# Patient Record
Sex: Male | Born: 1935 | Race: White | Hispanic: No | Marital: Married | Smoking: Never smoker
Health system: Southern US, Community
[De-identification: ages and names within clinical notes are randomized; demographics above are authoritative.]

## PROBLEM LIST (undated history)

## (undated) DIAGNOSIS — I252 Old myocardial infarction: Secondary | ICD-10-CM

## (undated) DIAGNOSIS — E119 Type 2 diabetes mellitus without complications: Secondary | ICD-10-CM

## (undated) DIAGNOSIS — I4891 Unspecified atrial fibrillation: Secondary | ICD-10-CM

## (undated) DIAGNOSIS — M1990 Unspecified osteoarthritis, unspecified site: Secondary | ICD-10-CM

## (undated) DIAGNOSIS — E7800 Pure hypercholesterolemia, unspecified: Secondary | ICD-10-CM

## (undated) DIAGNOSIS — N189 Chronic kidney disease, unspecified: Secondary | ICD-10-CM

## (undated) DIAGNOSIS — K5792 Diverticulitis of intestine, part unspecified, without perforation or abscess without bleeding: Secondary | ICD-10-CM

## (undated) DIAGNOSIS — I209 Angina pectoris, unspecified: Secondary | ICD-10-CM

## (undated) DIAGNOSIS — I10 Essential (primary) hypertension: Secondary | ICD-10-CM

## (undated) DIAGNOSIS — C801 Malignant (primary) neoplasm, unspecified: Secondary | ICD-10-CM

## (undated) DIAGNOSIS — I739 Peripheral vascular disease, unspecified: Secondary | ICD-10-CM

## (undated) DIAGNOSIS — I5030 Unspecified diastolic (congestive) heart failure: Secondary | ICD-10-CM

## (undated) DIAGNOSIS — I2109 ST elevation (STEMI) myocardial infarction involving other coronary artery of anterior wall: Principal | ICD-10-CM

## (undated) DIAGNOSIS — I2510 Atherosclerotic heart disease of native coronary artery without angina pectoris: Secondary | ICD-10-CM

## (undated) DIAGNOSIS — K219 Gastro-esophageal reflux disease without esophagitis: Secondary | ICD-10-CM

## (undated) HISTORY — PX: KNEE SURGERY: SHX244

## (undated) HISTORY — PX: EYE SURGERY: SHX253

## (undated) HISTORY — PX: CHOLECYSTECTOMY: SHX55

## (undated) HISTORY — PX: CORONARY ANGIOPLASTY: SHX604

---

## 2006-03-09 ENCOUNTER — Emergency Department (HOSPITAL_COMMUNITY): Admission: EM | Admit: 2006-03-09 | Discharge: 2006-03-09 | Payer: Self-pay | Admitting: Emergency Medicine

## 2006-03-11 ENCOUNTER — Emergency Department (HOSPITAL_COMMUNITY): Admission: EM | Admit: 2006-03-11 | Discharge: 2006-03-11 | Payer: Self-pay | Admitting: Emergency Medicine

## 2013-05-12 ENCOUNTER — Ambulatory Visit (HOSPITAL_COMMUNITY): Admit: 2013-05-12 | Payer: Self-pay | Admitting: Interventional Cardiology

## 2013-05-12 ENCOUNTER — Inpatient Hospital Stay (HOSPITAL_COMMUNITY)
Admission: EM | Admit: 2013-05-12 | Discharge: 2013-05-13 | DRG: 247 | Disposition: A | Discharge status: Home / Self Care | Payer: Medicare (Managed Care) | Attending: Interventional Cardiology | Admitting: Interventional Cardiology

## 2013-05-12 ENCOUNTER — Encounter (HOSPITAL_COMMUNITY): Payer: Self-pay

## 2013-05-12 ENCOUNTER — Encounter (HOSPITAL_COMMUNITY)
Admission: EM | Disposition: A | Discharge status: Home / Self Care | Payer: PRIVATE HEALTH INSURANCE | Source: Home / Self Care | Attending: Interventional Cardiology

## 2013-05-12 DIAGNOSIS — Z79899 Other long term (current) drug therapy: Secondary | ICD-10-CM

## 2013-05-12 DIAGNOSIS — I4891 Unspecified atrial fibrillation: Secondary | ICD-10-CM | POA: Diagnosis present

## 2013-05-12 DIAGNOSIS — Z7982 Long term (current) use of aspirin: Secondary | ICD-10-CM

## 2013-05-12 DIAGNOSIS — I10 Essential (primary) hypertension: Secondary | ICD-10-CM | POA: Diagnosis present

## 2013-05-12 DIAGNOSIS — I213 ST elevation (STEMI) myocardial infarction of unspecified site: Secondary | ICD-10-CM

## 2013-05-12 DIAGNOSIS — I4510 Unspecified right bundle-branch block: Secondary | ICD-10-CM | POA: Diagnosis present

## 2013-05-12 DIAGNOSIS — I2109 ST elevation (STEMI) myocardial infarction involving other coronary artery of anterior wall: Principal | ICD-10-CM | POA: Diagnosis present

## 2013-05-12 DIAGNOSIS — E7800 Pure hypercholesterolemia, unspecified: Secondary | ICD-10-CM | POA: Diagnosis present

## 2013-05-12 DIAGNOSIS — K5730 Diverticulosis of large intestine without perforation or abscess without bleeding: Secondary | ICD-10-CM | POA: Diagnosis present

## 2013-05-12 DIAGNOSIS — E119 Type 2 diabetes mellitus without complications: Secondary | ICD-10-CM | POA: Diagnosis present

## 2013-05-12 DIAGNOSIS — I2510 Atherosclerotic heart disease of native coronary artery without angina pectoris: Secondary | ICD-10-CM | POA: Diagnosis present

## 2013-05-12 HISTORY — DX: Gastro-esophageal reflux disease without esophagitis: K21.9

## 2013-05-12 HISTORY — DX: Chronic kidney disease, unspecified: N18.9

## 2013-05-12 HISTORY — DX: ST elevation (STEMI) myocardial infarction involving other coronary artery of anterior wall: I21.09

## 2013-05-12 HISTORY — DX: Atherosclerotic heart disease of native coronary artery without angina pectoris: I25.10

## 2013-05-12 HISTORY — DX: Essential (primary) hypertension: I10

## 2013-05-12 HISTORY — DX: Diverticulitis of intestine, part unspecified, without perforation or abscess without bleeding: K57.92

## 2013-05-12 HISTORY — DX: Malignant (primary) neoplasm, unspecified: C80.1

## 2013-05-12 HISTORY — PX: LEFT HEART CATH: SHX5478

## 2013-05-12 HISTORY — DX: Unspecified atrial fibrillation: I48.91

## 2013-05-12 HISTORY — DX: Peripheral vascular disease, unspecified: I73.9

## 2013-05-12 HISTORY — DX: Unspecified osteoarthritis, unspecified site: M19.90

## 2013-05-12 HISTORY — DX: Angina pectoris, unspecified: I20.9

## 2013-05-12 HISTORY — DX: Type 2 diabetes mellitus without complications: E11.9

## 2013-05-12 HISTORY — DX: Pure hypercholesterolemia, unspecified: E78.00

## 2013-05-12 LAB — COMPREHENSIVE METABOLIC PANEL
ALT: 12 U/L (ref 0–53)
AST: 22 U/L (ref 0–37)
Albumin: 3.9 g/dL (ref 3.5–5.2)
BUN: 26 mg/dL — ABNORMAL HIGH (ref 6–23)
CO2: 23 mEq/L (ref 19–32)
Calcium: 9.9 mg/dL (ref 8.4–10.5)
Creatinine, Ser: 1.23 mg/dL (ref 0.50–1.35)
Glucose, Bld: 188 mg/dL — ABNORMAL HIGH (ref 70–99)
Potassium: 4.4 mEq/L (ref 3.5–5.1)
Total Bilirubin: 0.6 mg/dL (ref 0.3–1.2)
Total Protein: 7.6 g/dL (ref 6.0–8.3)

## 2013-05-12 LAB — CBC
HCT: 41.6 % (ref 39.0–52.0)
Hemoglobin: 13.8 g/dL (ref 13.0–17.0)
MCV: 95.4 fL (ref 78.0–100.0)
RBC: 4.36 MIL/uL (ref 4.22–5.81)

## 2013-05-12 LAB — APTT: aPTT: 35 seconds (ref 24–37)

## 2013-05-12 LAB — HEMOGLOBIN A1C
Hgb A1c MFr Bld: 7.2 % — ABNORMAL HIGH (ref <5.7)
Mean Plasma Glucose: 160 mg/dL — ABNORMAL HIGH (ref <117)

## 2013-05-12 LAB — POCT I-STAT CREATININE: Creatinine, Ser: 1.5 mg/dL — ABNORMAL HIGH (ref 0.50–1.35)

## 2013-05-12 LAB — MRSA PCR SCREENING: MRSA by PCR: NEGATIVE

## 2013-05-12 LAB — POCT I-STAT TROPONIN I: Troponin i, poc: 0 ng/mL (ref 0.00–0.08)

## 2013-05-12 LAB — TROPONIN I
Troponin I: <0.3 ng/mL (ref <0.30)
Troponin I: <0.3 ng/mL (ref <0.30)

## 2013-05-12 LAB — CK TOTAL AND CKMB (NOT AT ARMC)
CK, MB: 2 ng/mL (ref 0.3–4.0)
Total CK: 101 U/L (ref 7–232)

## 2013-05-12 SURGERY — LEFT HEART CATH
Anesthesia: LOCAL

## 2013-05-12 MED ORDER — FAMOTIDINE 20 MG PO TABS
20.0000 mg | ORAL_TABLET | Freq: Every day | ORAL | Status: DC
  Administered 2013-05-12 – 2013-05-13 (×2): 20 mg via ORAL
  Filled 2013-05-12 (×2): qty 1

## 2013-05-12 MED ORDER — SODIUM CHLORIDE 0.9 % IV SOLN
1.7500 mg/kg/h | INTRAVENOUS | Status: AC
  Administered 2013-05-12: 1.75 mg/kg/h via INTRAVENOUS
  Filled 2013-05-12: qty 250

## 2013-05-12 MED ORDER — ACETAMINOPHEN 325 MG PO TABS: 650.0000 mg | ORAL_TABLET | ORAL | Status: DC | PRN

## 2013-05-12 MED ORDER — DIPHENHYDRAMINE HCL 25 MG PO CAPS: 25.0000 mg | ORAL_CAPSULE | Freq: Every evening | ORAL | Status: DC | PRN

## 2013-05-12 MED ORDER — MORPHINE SULFATE 2 MG/ML IJ SOLN
INTRAMUSCULAR | Status: AC
  Filled 2013-05-12: qty 1

## 2013-05-12 MED ORDER — NITROGLYCERIN 0.4 MG SL SUBL
0.4000 mg | SUBLINGUAL_TABLET | SUBLINGUAL | Status: DC | PRN
  Administered 2013-05-12 (×2): 0.4 mg via SUBLINGUAL
  Filled 2013-05-12: qty 25

## 2013-05-12 MED ORDER — ACETAMINOPHEN 500 MG PO TABS: 500.0000 mg | ORAL_TABLET | Freq: Every evening | ORAL | Status: DC | PRN

## 2013-05-12 MED ORDER — HEPARIN (PORCINE) IN NACL 100-0.45 UNIT/ML-% IJ SOLN
INTRAMUSCULAR | Status: AC
  Filled 2013-05-12: qty 250

## 2013-05-12 MED ORDER — INSULIN ASPART 100 UNIT/ML ~~LOC~~ SOLN
0.0000 [IU] | Freq: Three times a day (TID) | SUBCUTANEOUS | Status: DC
  Administered 2013-05-12: 2 [IU] via SUBCUTANEOUS
  Administered 2013-05-12: 3 [IU] via SUBCUTANEOUS
  Administered 2013-05-13: 2 [IU] via SUBCUTANEOUS
  Administered 2013-05-13: 3 [IU] via SUBCUTANEOUS

## 2013-05-12 MED ORDER — ZOLPIDEM TARTRATE 5 MG PO TABS: 5.0000 mg | ORAL_TABLET | Freq: Every evening | ORAL | Status: DC | PRN

## 2013-05-12 MED ORDER — SIMVASTATIN 40 MG PO TABS
40.0000 mg | ORAL_TABLET | Freq: Every day | ORAL | Status: DC
  Administered 2013-05-12: 40 mg via ORAL
  Filled 2013-05-12 (×2): qty 1

## 2013-05-12 MED ORDER — MORPHINE SULFATE 2 MG/ML IJ SOLN
2.0000 mg | Freq: Once | INTRAMUSCULAR | Status: AC
  Administered 2013-05-12: 2 mg via INTRAVENOUS

## 2013-05-12 MED ORDER — HEPARIN SODIUM (PORCINE) 5000 UNIT/ML IJ SOLN
4000.0000 [IU] | INTRAMUSCULAR | Status: AC
Start: 2013-05-12 — End: 2013-05-12
  Administered 2013-05-12: 4000 [IU] via INTRAVENOUS
  Filled 2013-05-12: qty 1

## 2013-05-12 MED ORDER — ASPIRIN 81 MG PO CHEW
81.0000 mg | CHEWABLE_TABLET | Freq: Every day | ORAL | Status: DC
  Administered 2013-05-13: 81 mg via ORAL
  Filled 2013-05-12: qty 1

## 2013-05-12 MED ORDER — SODIUM CHLORIDE 0.9 % IV SOLN
1.0000 mL/kg/h | INTRAVENOUS | Status: AC
  Administered 2013-05-12: 1 mL/kg/h via INTRAVENOUS

## 2013-05-12 MED ORDER — GI COCKTAIL ~~LOC~~: 30.0000 mL | Freq: Once | ORAL | Status: DC

## 2013-05-12 MED ORDER — NITROGLYCERIN 0.4 MG SL SUBL
SUBLINGUAL_TABLET | SUBLINGUAL | Status: AC
  Administered 2013-05-12: 0.4 mg via SUBLINGUAL
  Filled 2013-05-12: qty 25

## 2013-05-12 MED ORDER — ALUM & MAG HYDROXIDE-SIMETH 200-200-20 MG/5ML PO SUSP
30.0000 mL | ORAL | Status: DC | PRN
  Administered 2013-05-12: 30 mL via ORAL
  Filled 2013-05-12: qty 30

## 2013-05-12 MED ORDER — DIPHENHYDRAMINE-APAP (SLEEP) 25-500 MG PO TABS: 1.0000 | ORAL_TABLET | Freq: Every evening | ORAL | Status: DC | PRN

## 2013-05-12 MED ORDER — SODIUM CHLORIDE 0.9 % IV SOLN
INTRAVENOUS | Status: DC
  Administered 2013-05-12: 1000 mL via INTRAVENOUS

## 2013-05-12 MED ORDER — METOPROLOL TARTRATE 25 MG PO TABS
25.0000 mg | ORAL_TABLET | Freq: Two times a day (BID) | ORAL | Status: DC
  Administered 2013-05-12 – 2013-05-13 (×3): 25 mg via ORAL
  Filled 2013-05-12 (×4): qty 1

## 2013-05-12 MED ORDER — ONDANSETRON HCL 4 MG/2ML IJ SOLN: 4.0000 mg | Freq: Four times a day (QID) | INTRAMUSCULAR | Status: DC | PRN

## 2013-05-12 MED ORDER — ASPIRIN 81 MG PO CHEW: 81.0000 mg | CHEWABLE_TABLET | Freq: Every day | ORAL | Status: DC

## 2013-05-12 MED ORDER — HEPARIN (PORCINE) IN NACL 100-0.45 UNIT/ML-% IJ SOLN
1000.0000 [IU]/h | Freq: Once | INTRAMUSCULAR | Status: AC
  Administered 2013-05-12: 1000 [IU]/h via INTRAVENOUS

## 2013-05-12 MED ORDER — WHITE PETROLATUM GEL
Status: AC
  Administered 2013-05-12: 0.2
  Filled 2013-05-12: qty 5

## 2013-05-12 MED ORDER — ATORVASTATIN CALCIUM 40 MG PO TABS
40.0000 mg | ORAL_TABLET | Freq: Every day | ORAL | Status: DC
  Filled 2013-05-12: qty 1

## 2013-05-12 MED ORDER — NIFEDIPINE ER 60 MG PO TB24
60.0000 mg | ORAL_TABLET | Freq: Every day | ORAL | Status: DC
  Administered 2013-05-12 – 2013-05-13 (×2): 60 mg via ORAL
  Filled 2013-05-12 (×2): qty 1

## 2013-05-12 MED ORDER — TICAGRELOR 90 MG PO TABS
90.0000 mg | ORAL_TABLET | Freq: Two times a day (BID) | ORAL | Status: DC
  Administered 2013-05-12: 90 mg via ORAL
  Filled 2013-05-12 (×4): qty 1

## 2013-05-12 MED ORDER — ASPIRIN 81 MG PO CHEW
324.0000 mg | CHEWABLE_TABLET | Freq: Once | ORAL | Status: AC
  Administered 2013-05-12: 81 mg via ORAL
  Filled 2013-05-12: qty 1

## 2013-05-12 NOTE — CV Procedure (Addendum)
PROCEDURE:  Left heart catheterization with selective coronary angiography.  PCI LAD.  INDICATIONS:  Non-STEMI  The risks, benefits, and details of the procedure were explained to the patient.  The patient verbalized understanding and wanted to proceed.  Informed written consent was obtained.  PROCEDURE TECHNIQUE:  After Xylocaine anesthesia a 10F slender sheath was placed in the right radial artery with a single anterior needle wall stick.   Left coronary angiography was done using a Judkins L 3.5 guide catheter.  Right coronary angiography was done using a Judkins R4 guide catheter.  Left ventriculography was done using a pigtail catheter.    CONTRAST:  Total of 115 cc.  COMPLICATIONS:  None.    HEMODYNAMICS:  Aortic pressure was 99/64; LV pressure was 106/18; LVEDP 24.  There was no gradient between the left ventricle and aorta.    ANGIOGRAPHIC DATA:   The left main coronary artery is widely patent.  The left anterior descending artery is mildly diseased in the proximal vessel.  After the first septal perforator, there is moderate diffuse disease, up to 50%, which is more significant at the origin of the first diagonal.  The first diagonal has a proximal 80% stenosis.  This vessel is medium size.  Further along in the mid LAD, there is a diffuse stenosis with a focal 95% lesion.  The left circumflex artery is a large dominant vessel.  There is a small ramus vessel which is patent.  There is a large first obtuse marginal vessel which is widely patent.  The second obtuse marginal is medium-sized and widely patent.  The left PDA is widely patent as well.  The right coronary artery is a small, nondominant vessel.  LEFT VENTRICULOGRAM:  Left ventricular angiogram was not done.  LVEDP was 24 mmHg.  PCI NARRATIVE: A CLS 3.0 guiding catheters used to engage the left main.  Angiomax was used for anticoagulation.  An ACT was used to check that the Angiomax is therapeutic.  A pro-water wire was  placed across the stenosis in the LAD.  A 2.5 x 12 balloon was used to predilate the area of disease in the mid LAD.  A 2.5 x 24 Promus drug-eluting stent was then deployed.  A 2.5 noncompliant balloon was used to post dilate the stent to 2.6 mm in diameter.  There is an excellent in traffic result.  Several doses of intracoronary nitroglycerin were administered.  There is no residual stenosis in the stented segment.  IMPRESSIONS:  1. Normal left main coronary artery. 2. 95% lesion in the mid left anterior descending artery, successfully treated with a 2.5 x 24 Promus DES, postdilated to 2.6 mm.  Moderate disease more proximal to the severe stenosis.  Medium sized diagonal vessel with a 90% stenosis. 3. Widely patent left circumflex artery and its branches. 4. Patent, nondominant right coronary artery. 5. LVEDP 24 mmHg.  Ejection fraction not assessed.   RECOMMENDATION:  He'll need dual antiplatelet therapy for at least a year.  At the end of the procedure, he ended up in atrial fibrillation.  We'll have to assess if he continues to stay in atrial fibrillation or if he converts back to normal sinus rhythm.  He has not been on anticoagulation in the past.  We may need to reconsider this.  We'll hold metformin at this time.  We'll use sliding scale insulins.  He was still having some mild chest discomfort at the end of the procedure.  It's not altogether clear to me whether all  of his discomfort is coming from a cardiac source.

## 2013-05-12 NOTE — ED Provider Notes (Signed)
CSN: [id number]     Arrival date & time 05/12/13  0600 History   First MD Initiated Contact with Patient 05/12/13 [phone]     Chief Complaint  Patient presents with  . Chest Pain    Patient is a 79 y.o. male presenting with chest pain. The history is provided by the patient. The history is limited by the condition of the patient.  Chest Pain Pain location:  Substernal area Pain quality: pressure   Pain radiates to:  Does not radiate Pain severity:  Severe Onset quality:  Gradual Duration:  7 hours Timing:  Intermittent Progression:  Worsening Chronicity:  New Relieved by:  Nothing Worsened by:  Nothing tried Ineffective treatments:  Aspirin Associated symptoms: shortness of breath and weakness     Past Medical History  Diagnosis Date  . Atrial fibrillation   . Diverticulitis   . High cholesterol   . Hypertension   . Diabetes mellitus without complication    Past Surgical History  Procedure Laterality Date  . Cholecystectomy    . Knee surgery     No family history on file. History  Substance Use Topics  . Smoking status: Never Smoker   . Smokeless tobacco: Never Used  . Alcohol Use: No    Review of Systems  Unable to perform ROS: Acuity of condition  Respiratory: Positive for shortness of breath.   Cardiovascular: Positive for chest pain.  Gastrointestinal: Negative for blood in stool.  Neurological: Positive for weakness.    Allergies  Review of patient's allergies indicates no known allergies.  Home Medications   Current Outpatient Rx  Name  Route  Sig  Dispense  Refill  . aspirin 81 MG chewable tablet   Oral   Chew 81 mg by mouth daily.         . diphenhydramine-acetaminophen (TYLENOL PM) 25-500 MG TABS   Oral   Take 1 tablet by mouth at bedtime as needed.         Marland Kitchen lisinopril (PRINIVIL,ZESTRIL) 20 MG tablet   Oral   Take 20 mg by mouth daily.         Marland Kitchen NIFEdipine (PROCARDIA XL/ADALAT-CC) 60 MG 24 hr tablet   Oral   Take 60 mg by mouth  daily.         . ranitidine (ZANTAC) 300 MG capsule   Oral   Take 300 mg by mouth every evening.         . simvastatin (ZOCOR) 40 MG tablet   Oral   Take 40 mg by mouth every evening.         . triamterene-hydrochlorothiazide (DYAZIDE) 37.5-25 MG per capsule   Oral   Take 1 capsule by mouth every morning.          BP 141/60  Pulse 117  Temp(Src) 98.7 F (37.1 C) (Oral)  Resp 24  Ht 5\' 7"  (1.702 m)  Wt 272 lb (123.378 kg)  BMI 42.59 kg/m2  SpO2 97% Physical Exam CONSTITUTIONAL: Well developed/well nourished, uncomfortable appearing HEAD: Normocephalic/atraumatic EYES: EOMI/PERRL ENMT: Mucous membranes moist NECK: supple no meningeal signs SPINE:entire spine nontender CV: S1/S2 noted, no murmurs/rubs/gallops noted LUNGS: Lungs are clear to auscultation bilaterally, no apparent distress ABDOMEN: soft, obese nontender, no rebound or guarding GU:no cva tenderness NEURO: Pt is awake/alert, moves all extremitiesx4 EXTREMITIES: pulses normal, full ROM SKIN: warm, color normal PSYCH: no abnormalities of mood noted  ED Course  Procedures CRITICAL CARE Performed by: Sharyon Cable Total critical care time: 31 Critical care time  was exclusive of separately billable procedures and treating other patients. Critical care was necessary to treat or prevent imminent or life-threatening deterioration. Critical care was time spent personally by me on the following activities: development of treatment plan with patient and/or surrogate as well as nursing, discussions with consultants, evaluation of patient's response to treatment, examination of patient, obtaining history from patient or surrogate, ordering and performing treatments and interventions, ordering and review of laboratory studies,  and re-evaluation of patient's condition.  Labs Review Labs Reviewed  POCT I-STAT CREATININE - Abnormal; Notable for the following:    Creatinine, Ser 1.50 (*)    All other  components within normal limits  APTT  CBC  COMPREHENSIVE METABOLIC PANEL  PROTIME-INR  POCT I-STAT TROPONIN I   EKG reviewed and I called code STEMI for concerning EKG findings in inferior leads without any previous EKG to compare I spoke to dr Irish Lack and we discussed patient care. He requested I send text photo of EKG if possible so he could review the EKG  Patient was given ASA (pt had already taken 3 ASA at home), NTG and heparin He had minimal change in his pain with NTG Patient accepted to [facility] and will proceed to cardiac cath lab    MDM   1. STEMI (ST elevation myocardial infarction)    Nursing notes including past medical history and social history reviewed and considered in documentation Labs/vital reviewed and considered    Date: 05/12/2013 0603am  Rate: 99  Rhythm: normal sinus rhythm  QRS Axis: normal  Intervals: normal  ST/T Wave abnormalities: ST elevations inferiorly  Conduction Disutrbances:none  Narrative Interpretation:   Old EKG Reviewed: none available    Sharyon Cable, MD 05/12/13 [phone]

## 2013-05-12 NOTE — Progress Notes (Signed)
RN notified by pharmacist that GI cocktail incompatable with [facility]. Discontinued order and placed an order for Maalox per Cardiology standing orders

## 2013-05-12 NOTE — Progress Notes (Addendum)
Patient continues to have mild substernal chest pain that is made worse when patients lower sternum is palpated. Cardiac enzymes negative. Dr. Marlou Porch notified. New order received for GI coctail and if pain not relieved, RN instructed to give Nitro SL and place 1 inch of paste q 6 hours. Patient request to get out of bed. Transferred to chair without difficulty and denied CP worsening or new SOB. Will continue to monitor patient closely.

## 2013-05-12 NOTE — ED Notes (Signed)
Pt continues to rate pain 9/10 after first nitro. BP 125/52. MD made aware. Morphine 2mg  IVP stat ordered

## 2013-05-12 NOTE — Progress Notes (Signed)
Portia, RN, called: Still having some mild CP.  Pepcid seems to be helping. Will try GI coctail.  Next will try SL NTG if needed. If this helps then for overnight, NTG paste.

## 2013-05-12 NOTE — Progress Notes (Addendum)
Nitro .4mg  given SL while RN waits for Maalox. CP 1 prior to administration of nitro, and CP remained a 1 18min post nitro administration. CP remains reproducible with lower sternal palpation. Patient has displayed no other symptoms of pain throughout day other than verbalization. Patient continues to converse with nurses and family. Watching TV. Will continue to monitor patient and notify MD as needed.

## 2013-05-12 NOTE — ED Notes (Signed)
Pt rates pain 9/10 prior to first nitro administration. BP 141/60, pulse 102

## 2013-05-12 NOTE — H&P (Signed)
Admit date: 05/12/2013 Referring Physician [facility] emergency department Primary Cardiologist Irish Lack- new Chief complaint/reason for admission: Chest discomfort  HPI: 79 year old man with diabetes and a history of atrial fibrillation.  He has not been on anticoagulation in the past.  He reported chest discomfort starting at about 11 PM.  Prior to this, he had been feeling well.  He had never had this type of chest discomfort before.  He lives most of the year in [place] but owns some land in [place] and spent some time here is well.  He was visiting.  He had eaten dinner with his son and felt fine.  His discomfort felt like an indigestion.  After a few hours, he took some aspirin with no relief.  At about 4 in the morning, he woke up his son and told him to take him to the emergency department.  He was seen at [facility] and there were no old ECGs for comparison.  He had a right bundle branch block and there was question of ST elevation in lead 3.  There is not much in the way of significant reciprocal ST depression.  However, given his persistent discomfort, he was transferred to [facility] for emergent cardiac catheterization.    PMH:    Past Medical History  Diagnosis Date  . Atrial fibrillation   . Diverticulitis   . High cholesterol   . Hypertension   . Diabetes mellitus without complication     PSH:    Past Surgical History  Procedure Laterality Date  . Cholecystectomy    . Knee surgery      ALLERGIES:   Review of patient's allergies indicates no known allergies.  Prior to Admit Meds:   Prescriptions prior to admission  Medication Sig Dispense Refill  . aspirin 81 MG chewable tablet Chew 81 mg by mouth daily.      . diphenhydramine-acetaminophen (TYLENOL PM) 25-500 MG TABS Take 1 tablet by mouth at bedtime as needed.      Marland Kitchen lisinopril (PRINIVIL,ZESTRIL) 20 MG tablet Take 20 mg by mouth daily.      Marland Kitchen NIFEdipine (PROCARDIA XL/ADALAT-CC) 60 MG 24 hr tablet Take 60 mg by  mouth daily.      . ranitidine (ZANTAC) 300 MG capsule Take 300 mg by mouth every evening.      . simvastatin (ZOCOR) 40 MG tablet Take 40 mg by mouth every evening.      . triamterene-hydrochlorothiazide (DYAZIDE) 37.5-25 MG per capsule Take 1 capsule by mouth every morning.       Family HX:   No family history on file. Social HX:    History   Social History  . Marital Status: Married    Spouse Name: N/A    Number of Children: N/A  . Years of Education: N/A   Occupational History  . Not on file.   Social History Main Topics  . Smoking status: Never Smoker   . Smokeless tobacco: Never Used  . Alcohol Use: No  . Drug Use: No  . Sexual Activity: Not on file   Other Topics Concern  . Not on file   Social History Narrative  . No narrative on file     ROS:  All 11 ROS were addressed and are negative except what is stated in the HPI  PHYSICAL EXAM Filed Vitals:   05/12/13 0636  BP: 122/68  Pulse: 103  Temp:   Resp: 16   General: Well developed, well nourished, in no acute distress  Head: Eyes PERRLA, No xanthomas.   Normal cephalic and atramatic  Lungs:   Clear bilaterally to auscultation and percussion. Heart:  Irregularly irregular, No JVD.  Abdomen: abdomen soft and non-tender  Msk:  Back normal,  Normal strength and tone for age. Extremities:  Trace edema.  Right Radial 2+ Neuro: Alert and oriented X 3. Psych:  Normal affect, responds appropriately   Labs:   Lab Results  Component Value Date   WBC 15.0* 05/12/2013   HGB 13.8 05/12/2013   HCT 41.6 05/12/2013   MCV 95.4 05/12/2013   PLT 267 05/12/2013    Recent Labs Lab 05/12/13 0611 05/12/13 0617  NA 134*  --   K 4.4  --   CL 99  --   CO2 23  --   BUN 26*  --   CREATININE 1.23 1.50*  CALCIUM 9.9  --   PROT 7.6  --   BILITOT 0.6  --   ALKPHOS 64  --   ALT 12  --   AST 22  --   GLUCOSE 188*  --    No results found for this basename: CKTOTAL, CKMB, CKMBINDEX, TROPONINI   No results found for this  basename: PTT   Lab Results  Component Value Date   INR 1.01 05/12/2013     No results found for this basename: CHOL   No results found for this basename: HDL   No results found for this basename: LDLCALC   No results found for this basename: TRIG   No results found for this basename: CHOLHDL   No results found for this basename: LDLDIRECT      Radiology:  @RISRSLT24 @  EKG:  Sinus rhythm with PACs, right bundle branch block  ASSESSMENT: Chest discomfort history concerning for ischemic symptoms with questionable ECG changes and no old ECG for comparison.  Multiple risk factors for CAD.  PLAN:  Plan for cardiac cath to evaluate coronary anatomy.  Further plans will be based on the results of the cath.  He does have some mild renal insufficiency.  Will likely hold ACE inhibitor around the time of cath and try to minimize contrast.  He is diabetic and he has not been taking anticoagulation for his atrial fibrillation.  He has no bleeding problems.  We'll plan on drug-eluting stent in-stent is required.  Jettie Booze., MD  05/12/2013  8:17 AM

## 2013-05-12 NOTE — Progress Notes (Signed)
Chaplain responded to page from ED. No family members in route per nursing. Patient taken directly to Cath Lab.   05/12/13 [id number]  Clinical Encounter Type  Visited With Patient not available;Health care provider  Visit Type Initial;Code;ED  Referral From Nurse

## 2013-05-12 NOTE — ED Notes (Signed)
Last night around 2300 I had a pain in the center of my chest and thought it was indigestion, I went back to sleep and woke up around 0200 and took 3 baby aspirin and I went back to bed after that and I tried to go to sleep but it would not happen. At 0400 in was not getting any better per pt.

## 2013-05-13 LAB — BASIC METABOLIC PANEL
BUN: 21 mg/dL (ref 6–23)
CO2: 21 mEq/L (ref 19–32)
Calcium: 9.5 mg/dL (ref 8.4–10.5)
Chloride: 99 mEq/L (ref 96–112)
Creatinine, Ser: 1.1 mg/dL (ref 0.50–1.35)
GFR calc Af Amer: 72 mL/min — ABNORMAL LOW (ref >90)
GFR calc non Af Amer: 62 mL/min — ABNORMAL LOW (ref >90)
Glucose, Bld: 143 mg/dL — ABNORMAL HIGH (ref 70–99)
Sodium: 132 mEq/L — ABNORMAL LOW (ref 135–145)

## 2013-05-13 LAB — LIPID PANEL
Cholesterol: 115 mg/dL (ref 0–200)
LDL Cholesterol: 48 mg/dL (ref 0–99)
Triglycerides: 124 mg/dL (ref <150)

## 2013-05-13 LAB — CBC
Hemoglobin: 13.1 g/dL (ref 13.0–17.0)
MCH: 32.3 pg (ref 26.0–34.0)
Platelets: 254 10*3/uL (ref 150–400)
RBC: 4.06 MIL/uL — ABNORMAL LOW (ref 4.22–5.81)
RDW: 15.2 % (ref 11.5–15.5)
WBC: 12.7 10*3/uL — ABNORMAL HIGH (ref 4.0–10.5)

## 2013-05-13 LAB — GLUCOSE, CAPILLARY: Glucose-Capillary: 129 mg/dL — ABNORMAL HIGH (ref 70–99)

## 2013-05-13 MED ORDER — WARFARIN - PHYSICIAN DOSING INPATIENT: 5.0000 | Freq: Every day | Status: DC

## 2013-05-13 MED ORDER — COUMADIN BOOK
Freq: Once | Status: AC
  Administered 2013-05-13: 1
  Filled 2013-05-13: qty 1

## 2013-05-13 MED ORDER — CLOPIDOGREL BISULFATE 75 MG PO TABS: 75.0000 mg | ORAL_TABLET | Freq: Every day | ORAL | Status: DC

## 2013-05-13 MED ORDER — NITROGLYCERIN 0.4 MG SL SUBL: 0.4000 mg | SUBLINGUAL_TABLET | SUBLINGUAL | Status: AC | PRN

## 2013-05-13 MED ORDER — WARFARIN VIDEO
Freq: Once | Status: AC
  Administered 2013-05-13: 12:00:00

## 2013-05-13 MED ORDER — WARFARIN SODIUM 5 MG PO TABS: 5.0000 mg | ORAL_TABLET | Freq: Every day | ORAL | Status: DC

## 2013-05-13 MED ORDER — WARFARIN SODIUM 5 MG PO TABS
5.0000 mg | ORAL_TABLET | Freq: Every day | ORAL | Status: DC
  Filled 2013-05-13: qty 1

## 2013-05-13 MED ORDER — METOPROLOL TARTRATE 25 MG PO TABS: 25.0000 mg | ORAL_TABLET | Freq: Two times a day (BID) | ORAL | Status: DC

## 2013-05-13 MED ORDER — ASPIRIN 81 MG PO CHEW: 81.0000 mg | CHEWABLE_TABLET | Freq: Every day | ORAL | Status: DC

## 2013-05-13 MED ORDER — WARFARIN - PHYSICIAN DOSING INPATIENT: Freq: Every day | Status: DC

## 2013-05-13 MED ORDER — CLOPIDOGREL BISULFATE 75 MG PO TABS
75.0000 mg | ORAL_TABLET | Freq: Every day | ORAL | Status: DC
  Administered 2013-05-13: 75 mg via ORAL
  Filled 2013-05-13: qty 1

## 2013-05-13 MED ORDER — NITROGLYCERIN 0.4 MG SL SUBL: 0.4000 mg | SUBLINGUAL_TABLET | SUBLINGUAL | Status: DC | PRN

## 2013-05-13 NOTE — Discharge Summary (Signed)
Physician Discharge Summary  Patient ID: Lawrence Harper MRN: [id number] DOB/AGE: July 24, 1932 79 y.o.  Admit date: 05/12/2013 Discharge date: 05/13/2013  Admission Diagnoses: STEMI - LAD DES  Discharge Diagnoses:  Active Problems:   Acute myocardial infarction of other anterior wall, initial episode of care   Diabetes mellitus without complication   Hypertension   Atrial fibrillation   Discharged Condition: Good  Hospital Course: HPI: 79 year old man with diabetes and a history of atrial fibrillation. He has not been on anticoagulation in the past. He reported chest discomfort starting at about 11 PM. Prior to this, he had been feeling well. He had never had this type of chest discomfort before. He lives most of the year in [place] but owns some land in [place] and spent some time here is well. He was visiting. He had eaten dinner with his son and felt fine. His discomfort felt like an indigestion. After a few hours, he took some aspirin with no relief. At about 4 in the morning, he woke up his son and told him to take him to the emergency department. He was seen at [facility] and there were no old ECGs for comparison. He had a right bundle branch block and there was question of ST elevation in lead 3. There is not much in the way of significant reciprocal ST depression. However, given his persistent discomfort, he was transferred to [facility] for emergent cardiac catheterization.   Cath 05/12/13: IMPRESSIONS:  1. Normal left main coronary artery. 2. 95% lesion in the mid left anterior descending artery, successfully treated with a 2.5 x 24 Promus DES, postdilated to 2.6 mm. Moderate disease more proximal to the severe stenosis. Medium sized diagonal vessel with a 90% stenosis. 3. Widely patent left circumflex artery and its branches. 4. Patent, nondominant right coronary artery. 5. LVEDP 24 mmHg. Ejection fraction not assessed.  RECOMMENDATION: He'll need dual antiplatelet therapy  for at least a year. At the end of the procedure, he ended up in atrial fibrillation. We'll have to assess if he continues to stay in atrial fibrillation or if he converts back to normal sinus rhythm. He has not been on anticoagulation in the past. We may need to reconsider this. We'll hold metformin at this time. We'll use sliding scale insulins. He was still having some mild chest discomfort at the end of the procedure. It's not altogether clear to me whether all of his discomfort is coming from a cardiac source.  He remained in AFIB. Has impression that he has been told about AFIB in the past. PCP in [place], [place], [place].   We discussed the importance of anticoagulation. NOAC would not be an option because of recent stent placement and the need for DAPT. Discussed with Dr. Irish Lack. Explained with he and family risks of bleeding. He enjoys working on farm.   Last night, PPI helped with chest pain. Also CP was reproducible with palpation according to nursing notes. Troponin was normal.   Lengthy discussion with he and family. They are comfortable with going home. Ambulating well. No issues.    Discharge Exam: Blood pressure 114/56, pulse 78, temperature 98.5 F (36.9 C), temperature source Oral, resp. rate 17, height 5\' 7"  (1.702 m), weight 124.9 kg (275 lb 5.7 oz), SpO2 98.00%.  GEN: AAO x 3 in NAD CV: IRREG IRREG no JVD LUNGS: CTAB ABD: obese, soft normal BS EXT: no C/C/E  Disposition: Final discharge disposition not confirmed  Discharge Orders   Future Orders Complete By  Expires   Diet - low sodium heart healthy  As directed    Increase activity slowly  As directed        Medication List         aspirin 81 MG chewable tablet  Chew 81 mg by mouth daily.     clopidogrel 75 MG tablet  Commonly known as:  PLAVIX  Take 1 tablet (75 mg total) by mouth daily with breakfast.  Start taking on:  05/14/2013     diphenhydramine-acetaminophen 25-500 MG Tabs  Commonly known as:  TYLENOL  PM  Take 1 tablet by mouth at bedtime as needed (sleep).     lisinopril 20 MG tablet  Commonly known as:  PRINIVIL,ZESTRIL  Take 20 mg by mouth daily.     metoprolol tartrate 25 MG tablet  Commonly known as:  LOPRESSOR  Take 1 tablet (25 mg total) by mouth 2 (two) times daily.     NIFEdipine 60 MG 24 hr tablet  Commonly known as:  PROCARDIA XL/ADALAT-CC  Take 60 mg by mouth daily.     nitroGLYCERIN 0.4 MG SL tablet  Commonly known as:  NITROSTAT  Place 1 tablet (0.4 mg total) under the tongue every 5 (five) minutes as needed for chest pain.     ranitidine 300 MG capsule  Commonly known as:  ZANTAC  Take 300 mg by mouth every evening.     simvastatin 40 MG tablet  Commonly known as:  ZOCOR  Take 40 mg by mouth every evening.     STUDY MEDICATION  Take 1 tablet by mouth daily. Research medication for blood sugar. Through "[facility]"     STUDY MEDICATION  Take 1 tablet by mouth 2 (two) times daily. Metformin study medication supplied through [facility] 37.5-25 MG per capsule  Commonly known as:  DYAZIDE  Take 1 capsule by mouth every morning.     warfarin 5 MG tablet  Commonly known as:  COUMADIN  Take 1 tablet (5 mg total) by mouth daily.           Follow-up Information   Follow up with Jettie Booze., MD On 05/15/2013. (Coumadin clinic with Alferd Apa, Pharm D on Tuesday)    Specialty:  Cardiology   Contact information:   [address] [id number] [phone]      18min spent with family education, review.   SignedCandee Furbish 05/13/2013, 10:28 AM

## 2013-05-14 LAB — POCT ACTIVATED CLOTTING TIME: Activated Clotting Time: 411 seconds

## 2013-05-17 ENCOUNTER — Encounter: Payer: Self-pay | Admitting: *Deleted

## 2013-05-17 ENCOUNTER — Encounter: Payer: Self-pay | Admitting: Interventional Cardiology

## 2013-05-17 DIAGNOSIS — N184 Chronic kidney disease, stage 4 (severe): Secondary | ICD-10-CM | POA: Insufficient documentation

## 2013-05-17 DIAGNOSIS — N179 Acute kidney failure, unspecified: Secondary | ICD-10-CM | POA: Insufficient documentation

## 2013-05-17 DIAGNOSIS — M1990 Unspecified osteoarthritis, unspecified site: Secondary | ICD-10-CM | POA: Insufficient documentation

## 2013-05-17 DIAGNOSIS — C801 Malignant (primary) neoplasm, unspecified: Secondary | ICD-10-CM | POA: Insufficient documentation

## 2013-05-17 DIAGNOSIS — I2510 Atherosclerotic heart disease of native coronary artery without angina pectoris: Secondary | ICD-10-CM | POA: Insufficient documentation

## 2013-05-17 DIAGNOSIS — I739 Peripheral vascular disease, unspecified: Secondary | ICD-10-CM | POA: Insufficient documentation

## 2013-05-17 DIAGNOSIS — K219 Gastro-esophageal reflux disease without esophagitis: Secondary | ICD-10-CM | POA: Insufficient documentation

## 2013-05-17 DIAGNOSIS — N189 Chronic kidney disease, unspecified: Secondary | ICD-10-CM | POA: Insufficient documentation

## 2013-05-17 DIAGNOSIS — K5792 Diverticulitis of intestine, part unspecified, without perforation or abscess without bleeding: Secondary | ICD-10-CM | POA: Insufficient documentation

## 2013-05-18 MED FILL — Fentanyl Citrate Inj 0.05 MG/ML: INTRAMUSCULAR | Qty: 2 | Status: AC

## 2013-05-18 MED FILL — Lidocaine HCl Local Preservative Free (PF) Inj 1%: INTRAMUSCULAR | Qty: 30 | Status: AC

## 2013-05-18 MED FILL — Nitroglycerin IV Soln 200 MCG/ML in D5W: INTRAVENOUS | Qty: 1 | Status: AC

## 2013-05-18 MED FILL — Midazolam HCl Inj 2 MG/2ML (Base Equivalent): INTRAMUSCULAR | Qty: 2 | Status: AC

## 2013-05-18 MED FILL — Heparin Sodium (Porcine) 2 Unit/ML in Sodium Chloride 0.9%: INTRAMUSCULAR | Qty: 1500 | Status: AC

## 2013-05-18 MED FILL — Sodium Chloride IV Soln 0.9%: INTRAVENOUS | Qty: 50 | Status: AC

## 2013-05-18 MED FILL — Bivalirudin Trifluoroacetate For IV Soln 250 MG (Base Equiv): INTRAVENOUS | Qty: 250 | Status: AC

## 2013-05-25 ENCOUNTER — Encounter: Payer: Self-pay | Admitting: Interventional Cardiology

## 2013-05-25 ENCOUNTER — Ambulatory Visit (INDEPENDENT_AMBULATORY_CARE_PROVIDER_SITE_OTHER): Payer: PRIVATE HEALTH INSURANCE | Admitting: Interventional Cardiology

## 2013-05-25 VITALS — BP 122/58 | HR 71 | Ht 67.0 in | Wt 275.0 lb

## 2013-05-25 DIAGNOSIS — I2510 Atherosclerotic heart disease of native coronary artery without angina pectoris: Secondary | ICD-10-CM

## 2013-05-25 DIAGNOSIS — I10 Essential (primary) hypertension: Secondary | ICD-10-CM

## 2013-05-25 DIAGNOSIS — I4891 Unspecified atrial fibrillation: Secondary | ICD-10-CM

## 2013-05-25 NOTE — Patient Instructions (Addendum)
Pt should continue to follow up in [place] with his pcp and Cardiologist. No follow up here needed.

## 2013-05-25 NOTE — Progress Notes (Signed)
Patient ID: Lawrence Harper, male   DOB: [birth year], 79 y.o.   MRN: [id number]    [address], [place], [place]  [address] Phone: [phone] Fax:  [phone]  Date:  05/25/2013   ID:  Lawrence Harper, DOB 06/26/1932, MRN [id number]  PCP:  No PCP Per Patient      History of Present Illness: Lawrence Harper is a 79 y.o. male who had unstable angina and question of a STEMI a few weeks ago.  He had an LAD stent placed.  He has no further chest pain.  He denies any palpitations. He has not had any shortness of breath. He feels that his energy level has improved. He denies any swelling in his legs as well. He has not used any nitroglycerin. He walks at least a mile a day.   Wt Readings from Last 3 Encounters:  05/25/13 275 lb (124.739 kg)  05/12/13 275 lb 5.7 oz (124.9 kg)  05/12/13 275 lb 5.7 oz (124.9 kg)     Past Medical History  Diagnosis Date  . Atrial fibrillation   . Diverticulitis   . High cholesterol   . Hypertension   . Diabetes mellitus without complication   . Acute myocardial infarction of other anterior wall, initial episode of care   . Coronary artery disease   . Anginal pain   . Peripheral vascular disease   . GERD (gastroesophageal reflux disease)   . Cancer   . Chronic kidney disease   . Arthritis     Current Outpatient Prescriptions  Medication Sig Dispense Refill  . aspirin 81 MG chewable tablet Chew 81 mg by mouth daily.      Marland Kitchen aspirin 81 MG chewable tablet Chew 1 tablet (81 mg total) by mouth daily.  30 tablet  12  . clopidogrel (PLAVIX) 75 MG tablet Take 1 tablet (75 mg total) by mouth daily with breakfast.  30 tablet  12  . diphenhydramine-acetaminophen (TYLENOL PM) 25-500 MG TABS Take 1 tablet by mouth at bedtime as needed (sleep).      Marland Kitchen lisinopril (PRINIVIL,ZESTRIL) 20 MG tablet Take 20 mg by mouth daily.      . metoprolol tartrate (LOPRESSOR) 25 MG tablet Take 1 tablet (25 mg total) by mouth 2 (two) times daily.  30 tablet  12  .  NIFEdipine (PROCARDIA XL/ADALAT-CC) 60 MG 24 hr tablet Take 60 mg by mouth daily.      . nitroGLYCERIN (NITROSTAT) 0.4 MG SL tablet Place 1 tablet (0.4 mg total) under the tongue every 5 (five) minutes as needed for chest pain.  30 tablet  12  . ranitidine (ZANTAC) 300 MG capsule Take 300 mg by mouth every evening.      . simvastatin (ZOCOR) 40 MG tablet Take 40 mg by mouth every evening.      . STUDY MEDICATION Take 1 tablet by mouth daily. Research medication for blood sugar. Through "[facility]"      . STUDY MEDICATION Take 1 tablet by mouth 2 (two) times daily. Metformin study medication supplied through [facility]      . triamterene-hydrochlorothiazide (DYAZIDE) 37.5-25 MG per capsule Take 1 capsule by mouth every morning.      . warfarin (COUMADIN) 5 MG tablet Take 1 tablet (5 mg total) by mouth daily.  30 tablet  1  . Warfarin - Physician Dosing Inpatient MISC 5 each by Does not apply route daily at 6 PM.  1 each  1   No  current facility-administered medications for this visit.    Allergies:   No Known Allergies  Social History:  The patient  reports that he has never smoked. He has never used smokeless tobacco. He reports that he does not drink alcohol or use illicit drugs.   Family History:  The patient's family history includes Throat cancer (age of onset: 65) in his mother.   ROS:  Please see the history of present illness.  No nausea, vomiting.  No fevers, chills.  No focal weakness.  No dysuria. More energy.  All other systems reviewed and negative.   PHYSICAL EXAM: VS:  BP 122/58  Pulse 71  Ht 5\' 7"  (1.702 m)  Wt 275 lb (124.739 kg)  BMI 43.06 kg/m2  SpO2 98% Well nourished, well developed, in no acute distress HEENT: normal Neck: no JVD, no carotid bruits Cardiac:  Irregularly irregular, rate controlled Lungs:  clear to auscultation bilaterally, no wheezing, rhonchi or rales Abd: soft, nontender, no hepatomegaly Ext: 1+ biolateral lower extremity  edema Skin: warm and dry Neuro:   no focal abnormalities noted  EKG:      ASSESSMENT AND PLAN:  1. CAD-status post drug-eluting stent to the mid LAD. There was mild to moderate disease more proximally. He will followup in [place] with a new cardiologist since that is where he spends most of his time. He understands to continue Plavix. I have instructed him to stop aspirin. 2. AFib-  Rate control. Coumadin for stroke prevention. Since he is also on Plavix, no need for aspirin. 3. Obesity- continue dietary changes and regular exercise to try to lose weight.  4. HTN- controlled. Continue current medications.  Signed, Mina Marble, MD, [facility] 05/25/2013 2:21 PM

## 2014-08-01 ENCOUNTER — Encounter (HOSPITAL_COMMUNITY): Payer: Self-pay | Admitting: Interventional Cardiology

## 2021-08-10 ENCOUNTER — Other Ambulatory Visit: Payer: Self-pay

## 2021-08-10 ENCOUNTER — Inpatient Hospital Stay (HOSPITAL_COMMUNITY)
Admission: EM | Admit: 2021-08-10 | Discharge: 2021-08-17 | DRG: 291 | Disposition: A | Discharge status: Home / Self Care | Payer: Medicare (Managed Care) | Attending: Family Medicine | Admitting: Family Medicine

## 2021-08-10 ENCOUNTER — Emergency Department (HOSPITAL_COMMUNITY): Payer: Medicare (Managed Care)

## 2021-08-10 ENCOUNTER — Encounter (HOSPITAL_COMMUNITY): Payer: Self-pay | Admitting: *Deleted

## 2021-08-10 DIAGNOSIS — N189 Chronic kidney disease, unspecified: Secondary | ICD-10-CM | POA: Diagnosis present

## 2021-08-10 DIAGNOSIS — I252 Old myocardial infarction: Secondary | ICD-10-CM | POA: Diagnosis not present

## 2021-08-10 DIAGNOSIS — I2510 Atherosclerotic heart disease of native coronary artery without angina pectoris: Secondary | ICD-10-CM | POA: Diagnosis present

## 2021-08-10 DIAGNOSIS — Z808 Family history of malignant neoplasm of other organs or systems: Secondary | ICD-10-CM | POA: Diagnosis not present

## 2021-08-10 DIAGNOSIS — Z20822 Contact with and (suspected) exposure to covid-19: Secondary | ICD-10-CM | POA: Diagnosis present

## 2021-08-10 DIAGNOSIS — R140 Abdominal distension (gaseous): Secondary | ICD-10-CM

## 2021-08-10 DIAGNOSIS — I493 Ventricular premature depolarization: Secondary | ICD-10-CM | POA: Diagnosis present

## 2021-08-10 DIAGNOSIS — E1151 Type 2 diabetes mellitus with diabetic peripheral angiopathy without gangrene: Secondary | ICD-10-CM | POA: Diagnosis present

## 2021-08-10 DIAGNOSIS — Z79899 Other long term (current) drug therapy: Secondary | ICD-10-CM | POA: Diagnosis not present

## 2021-08-10 DIAGNOSIS — I350 Nonrheumatic aortic (valve) stenosis: Secondary | ICD-10-CM | POA: Diagnosis not present

## 2021-08-10 DIAGNOSIS — I071 Rheumatic tricuspid insufficiency: Secondary | ICD-10-CM | POA: Diagnosis present

## 2021-08-10 DIAGNOSIS — I130 Hypertensive heart and chronic kidney disease with heart failure and stage 1 through stage 4 chronic kidney disease, or unspecified chronic kidney disease: Principal | ICD-10-CM | POA: Diagnosis present

## 2021-08-10 DIAGNOSIS — D631 Anemia in chronic kidney disease: Secondary | ICD-10-CM | POA: Diagnosis present

## 2021-08-10 DIAGNOSIS — I700 Atherosclerosis of aorta: Secondary | ICD-10-CM | POA: Diagnosis present

## 2021-08-10 DIAGNOSIS — I50810 Right heart failure, unspecified: Secondary | ICD-10-CM | POA: Diagnosis present

## 2021-08-10 DIAGNOSIS — I4821 Permanent atrial fibrillation: Secondary | ICD-10-CM | POA: Diagnosis present

## 2021-08-10 DIAGNOSIS — I472 Ventricular tachycardia, unspecified: Secondary | ICD-10-CM | POA: Diagnosis present

## 2021-08-10 DIAGNOSIS — E7800 Pure hypercholesterolemia, unspecified: Secondary | ICD-10-CM | POA: Diagnosis present

## 2021-08-10 DIAGNOSIS — N179 Acute kidney failure, unspecified: Secondary | ICD-10-CM | POA: Diagnosis present

## 2021-08-10 DIAGNOSIS — K219 Gastro-esophageal reflux disease without esophagitis: Secondary | ICD-10-CM | POA: Diagnosis present

## 2021-08-10 DIAGNOSIS — R200 Anesthesia of skin: Secondary | ICD-10-CM | POA: Diagnosis present

## 2021-08-10 DIAGNOSIS — Z955 Presence of coronary angioplasty implant and graft: Secondary | ICD-10-CM

## 2021-08-10 DIAGNOSIS — E1122 Type 2 diabetes mellitus with diabetic chronic kidney disease: Secondary | ICD-10-CM | POA: Diagnosis present

## 2021-08-10 DIAGNOSIS — I10 Essential (primary) hypertension: Secondary | ICD-10-CM | POA: Diagnosis present

## 2021-08-10 DIAGNOSIS — Z66 Do not resuscitate: Secondary | ICD-10-CM | POA: Diagnosis present

## 2021-08-10 DIAGNOSIS — E785 Hyperlipidemia, unspecified: Secondary | ICD-10-CM | POA: Diagnosis not present

## 2021-08-10 DIAGNOSIS — N184 Chronic kidney disease, stage 4 (severe): Secondary | ICD-10-CM | POA: Diagnosis present

## 2021-08-10 DIAGNOSIS — J9600 Acute respiratory failure, unspecified whether with hypoxia or hypercapnia: Secondary | ICD-10-CM

## 2021-08-10 DIAGNOSIS — H409 Unspecified glaucoma: Secondary | ICD-10-CM | POA: Diagnosis present

## 2021-08-10 DIAGNOSIS — M79606 Pain in leg, unspecified: Secondary | ICD-10-CM

## 2021-08-10 DIAGNOSIS — I509 Heart failure, unspecified: Secondary | ICD-10-CM | POA: Diagnosis not present

## 2021-08-10 DIAGNOSIS — I455 Other specified heart block: Secondary | ICD-10-CM

## 2021-08-10 DIAGNOSIS — Z7901 Long term (current) use of anticoagulants: Secondary | ICD-10-CM | POA: Diagnosis not present

## 2021-08-10 DIAGNOSIS — Z9049 Acquired absence of other specified parts of digestive tract: Secondary | ICD-10-CM | POA: Diagnosis not present

## 2021-08-10 DIAGNOSIS — Z6839 Body mass index (BMI) 39.0-39.9, adult: Secondary | ICD-10-CM

## 2021-08-10 DIAGNOSIS — I4811 Longstanding persistent atrial fibrillation: Secondary | ICD-10-CM | POA: Diagnosis not present

## 2021-08-10 DIAGNOSIS — E669 Obesity, unspecified: Secondary | ICD-10-CM | POA: Diagnosis present

## 2021-08-10 DIAGNOSIS — I5033 Acute on chronic diastolic (congestive) heart failure: Secondary | ICD-10-CM | POA: Diagnosis present

## 2021-08-10 DIAGNOSIS — J9601 Acute respiratory failure with hypoxia: Secondary | ICD-10-CM | POA: Diagnosis present

## 2021-08-10 DIAGNOSIS — I4891 Unspecified atrial fibrillation: Secondary | ICD-10-CM | POA: Diagnosis present

## 2021-08-10 DIAGNOSIS — N281 Cyst of kidney, acquired: Secondary | ICD-10-CM | POA: Diagnosis present

## 2021-08-10 DIAGNOSIS — Z7189 Other specified counseling: Secondary | ICD-10-CM

## 2021-08-10 DIAGNOSIS — R0902 Hypoxemia: Secondary | ICD-10-CM

## 2021-08-10 HISTORY — DX: Unspecified diastolic (congestive) heart failure: I50.30

## 2021-08-10 HISTORY — DX: Old myocardial infarction: I25.2

## 2021-08-10 LAB — COMPREHENSIVE METABOLIC PANEL
ALT: 12 U/L (ref 0–44)
AST: 20 U/L (ref 15–41)
Albumin: 3 g/dL — ABNORMAL LOW (ref 3.5–5.0)
Alkaline Phosphatase: 143 U/L — ABNORMAL HIGH (ref 38–126)
Anion gap: 11 (ref 5–15)
BUN: 70 mg/dL — ABNORMAL HIGH (ref 8–23)
CO2: 21 mmol/L — ABNORMAL LOW (ref 22–32)
Calcium: 8.9 mg/dL (ref 8.9–10.3)
Chloride: 104 mmol/L (ref 98–111)
Creatinine, Ser: 2.86 mg/dL — ABNORMAL HIGH (ref 0.61–1.24)
GFR, Estimated: 21 mL/min — ABNORMAL LOW (ref >60)
Glucose, Bld: 91 mg/dL (ref 70–99)
Potassium: 4.7 mmol/L (ref 3.5–5.1)
Sodium: 136 mmol/L (ref 135–145)
Total Bilirubin: 0.7 mg/dL (ref 0.3–1.2)
Total Protein: 8.1 g/dL (ref 6.5–8.1)

## 2021-08-10 LAB — CBC WITH DIFFERENTIAL/PLATELET
Abs Immature Granulocytes: 0.03 10*3/uL (ref 0.00–0.07)
Basophils Absolute: 0.1 10*3/uL (ref 0.0–0.1)
Basophils Relative: 1 %
Eosinophils Absolute: 0.2 10*3/uL (ref 0.0–0.5)
Eosinophils Relative: 3 %
HCT: 31.4 % — ABNORMAL LOW (ref 39.0–52.0)
Hemoglobin: 9.7 g/dL — ABNORMAL LOW (ref 13.0–17.0)
Immature Granulocytes: 1 %
Lymphocytes Relative: 10 %
Lymphs Abs: 0.6 10*3/uL — ABNORMAL LOW (ref 0.7–4.0)
MCH: 31.6 pg (ref 26.0–34.0)
MCHC: 30.9 g/dL (ref 30.0–36.0)
MCV: 102.3 fL — ABNORMAL HIGH (ref 80.0–100.0)
Monocytes Absolute: 0.5 10*3/uL (ref 0.1–1.0)
Monocytes Relative: 9 %
Neutro Abs: 4.8 10*3/uL (ref 1.7–7.7)
Neutrophils Relative %: 76 %
Platelets: 287 10*3/uL (ref 150–400)
RBC: 3.07 MIL/uL — ABNORMAL LOW (ref 4.22–5.81)
RDW: 18.3 % — ABNORMAL HIGH (ref 11.5–15.5)
WBC: 6.2 10*3/uL (ref 4.0–10.5)
nRBC: 0 % (ref 0.0–0.2)

## 2021-08-10 LAB — TROPONIN I (HIGH SENSITIVITY)
Troponin I (High Sensitivity): 10 ng/L (ref <18)
Troponin I (High Sensitivity): 9 ng/L (ref <18)

## 2021-08-10 LAB — BRAIN NATRIURETIC PEPTIDE: B Natriuretic Peptide: 522 pg/mL — ABNORMAL HIGH (ref 0.0–100.0)

## 2021-08-10 LAB — MAGNESIUM: Magnesium: 2.2 mg/dL (ref 1.7–2.4)

## 2021-08-10 MED ORDER — ONDANSETRON HCL 4 MG/2ML IJ SOLN: 4.0000 mg | Freq: Four times a day (QID) | INTRAMUSCULAR | Status: DC | PRN

## 2021-08-10 MED ORDER — FUROSEMIDE 10 MG/ML IJ SOLN
80.0000 mg | Freq: Two times a day (BID) | INTRAMUSCULAR | Status: DC
  Administered 2021-08-10 – 2021-08-12 (×4): 80 mg via INTRAVENOUS
  Filled 2021-08-10 (×4): qty 8

## 2021-08-10 MED ORDER — SODIUM CHLORIDE 0.9 % IV SOLN: 250.0000 mL | INTRAVENOUS | Status: DC | PRN

## 2021-08-10 MED ORDER — NYSTATIN 100000 UNIT/GM EX POWD
Freq: Three times a day (TID) | CUTANEOUS | Status: DC
Start: 2021-08-10 — End: 2021-08-17
  Administered 2021-08-12 – 2021-08-16 (×2): 1 via TOPICAL
  Filled 2021-08-10 (×2): qty 15

## 2021-08-10 MED ORDER — SODIUM CHLORIDE 0.9% FLUSH
3.0000 mL | Freq: Two times a day (BID) | INTRAVENOUS | Status: DC
  Administered 2021-08-10 – 2021-08-16 (×9): 3 mL via INTRAVENOUS

## 2021-08-10 MED ORDER — ACETAMINOPHEN 500 MG PO TABS
1000.0000 mg | ORAL_TABLET | Freq: Three times a day (TID) | ORAL | Status: DC | PRN
  Administered 2021-08-15 – 2021-08-17 (×3): 1000 mg via ORAL
  Filled 2021-08-10 (×3): qty 2

## 2021-08-10 MED ORDER — TIMOLOL MALEATE 0.5 % OP SOLN
1.0000 [drp] | Freq: Two times a day (BID) | OPHTHALMIC | Status: DC
  Administered 2021-08-10 – 2021-08-17 (×14): 1 [drp] via OPHTHALMIC
  Filled 2021-08-10 (×2): qty 5

## 2021-08-10 MED ORDER — FOLIC ACID 1 MG PO TABS
1.0000 mg | ORAL_TABLET | Freq: Every day | ORAL | Status: DC
  Administered 2021-08-10 – 2021-08-17 (×8): 1 mg via ORAL
  Filled 2021-08-10 (×8): qty 1

## 2021-08-10 MED ORDER — SODIUM CHLORIDE 0.9% FLUSH: 3.0000 mL | INTRAVENOUS | Status: DC | PRN

## 2021-08-10 MED ORDER — OYSTER SHELL CALCIUM/D3 500-5 MG-MCG PO TABS
1.0000 | ORAL_TABLET | Freq: Every day | ORAL | Status: DC
  Administered 2021-08-10 – 2021-08-17 (×8): 1 via ORAL
  Filled 2021-08-10 (×8): qty 1

## 2021-08-10 MED ORDER — ATORVASTATIN CALCIUM 40 MG PO TABS
40.0000 mg | ORAL_TABLET | Freq: Every day | ORAL | Status: DC
  Administered 2021-08-10 – 2021-08-16 (×7): 40 mg via ORAL
  Filled 2021-08-10 (×7): qty 1

## 2021-08-10 MED ORDER — FERROUS SULFATE 325 (65 FE) MG PO TABS
325.0000 mg | ORAL_TABLET | Freq: Every day | ORAL | Status: DC
  Administered 2021-08-11 – 2021-08-17 (×7): 325 mg via ORAL
  Filled 2021-08-10 (×7): qty 1

## 2021-08-10 MED ORDER — FUROSEMIDE 10 MG/ML IJ SOLN
80.0000 mg | Freq: Once | INTRAMUSCULAR | Status: AC
  Administered 2021-08-10: 10:00:00 80 mg via INTRAVENOUS
  Filled 2021-08-10: qty 8

## 2021-08-10 MED ORDER — NITROGLYCERIN 0.4 MG SL SUBL: 0.4000 mg | SUBLINGUAL_TABLET | SUBLINGUAL | Status: DC | PRN

## 2021-08-10 MED ORDER — APIXABAN 2.5 MG PO TABS
2.5000 mg | ORAL_TABLET | Freq: Two times a day (BID) | ORAL | Status: DC
  Administered 2021-08-10 – 2021-08-17 (×14): 2.5 mg via ORAL
  Filled 2021-08-10 (×14): qty 1

## 2021-08-10 MED ORDER — METOPROLOL TARTRATE 25 MG PO TABS
25.0000 mg | ORAL_TABLET | Freq: Two times a day (BID) | ORAL | Status: DC
  Administered 2021-08-10: 22:00:00 25 mg via ORAL
  Filled 2021-08-10 (×2): qty 1

## 2021-08-10 NOTE — ED Triage Notes (Signed)
Pt c/o SOB and "fluid build up" x few weeks. Pt reports he is normally 197lbs but today he is weighing 245lbs.

## 2021-08-10 NOTE — Assessment & Plan Note (Signed)
-  hold ACE due to worsening CKD

## 2021-08-10 NOTE — ED Notes (Signed)
Pt hypoxic on ra 88%.  Placed on o2 via [place] 2lpm.  O2 sats increased to 90%

## 2021-08-10 NOTE — Assessment & Plan Note (Signed)
-  baseline 2.1 -monitor closely while getting diuresis

## 2021-08-10 NOTE — H&P (Signed)
History and Physical    Lawrence Harper [id number] DOB: Oct 01, 1931 DOA: 08/10/2021  PCP: System, Provider Not In - follows with PCP in [place]  Chief Complaint: SOB  HPI: Lawrence Harper is a 87 y.o. male with medical history significant of paroxysmal A. fib, coronary artery disease, hypertension, obesity, chronic kidney disease, and diastolic heart failure.  Patient splits time between [facility] and [place].  Most of patient's primary care and specialist reside in [place], fortunately these records are available through epic for review.  Patient states that he has gained 40 to 50 pounds in the last 2 months.  Apparently patient had stopped taking his Lasix for a time period and was just resumed about a month ago his doctors in [place] have been encouraging him to come to the hospital for about 10 days.  Patient states that the swelling in his abdomen and his shortness of breath with exertion has continued to get worse prompting him to come to the ER. He states his baseline weight is around 200 pounds.  Weight in the ER is 245 pounds.  In the ER he was found to be volume overloaded, requiring 2 to 3 L of oxygen.  He was given IV Lasix.  Hospitalist were consulted for admission for continued diuresis.   Review of Systems: all systems reviewed, negative unless stated above    No Known Allergies  Past Medical History:  Diagnosis Date   Acute myocardial infarction of other anterior wall, initial episode of care    Anginal pain ([place])    Arthritis    Atrial fibrillation (HCC)    Cancer (HCC)    Chronic kidney disease    Coronary artery disease    Diabetes mellitus without complication (HCC)    Diverticulitis    GERD (gastroesophageal reflux disease)    High cholesterol    Hypertension    Peripheral vascular disease ([place])     Past Surgical History:  Procedure Laterality Date   CHOLECYSTECTOMY     CORONARY ANGIOPLASTY     EYE SURGERY     KNEE SURGERY     KNEE SURGERY      LEFT HEART CATH N/A 05/12/2013   Procedure: LEFT HEART CATH;  Surgeon: Jettie Booze, MD;  Location: [facility] CATH LAB;  Service: Cardiovascular;  Laterality: N/A;     reports that he has never smoked. He has never used smokeless tobacco. He reports that he does not drink alcohol and does not use drugs.  Family History  Problem Relation Age of Onset   Throat cancer Mother 87    Prior to Admission medications   Medication Sig Start Date End Date Taking? Authorizing Provider  acetaminophen (TYLENOL) 650 MG CR tablet Take 1,300 mg by mouth every 8 (eight) hours as needed for pain.   Yes [provider]  amLODipine (NORVASC) 10 MG tablet Take 10 mg by mouth daily. 07/22/21  Yes [provider]  atorvastatin (LIPITOR) 40 MG tablet Take 40 mg by mouth daily. 07/22/21  Yes [provider]  Calcium Carbonate-Vitamin D (CALCIUM 600 +D HIGH POTENCY) 600-10 MG-MCG TABS Take 1 tablet by mouth daily.   Yes [provider]  ELIQUIS 5 MG TABS tablet Take 2.5 mg by mouth 2 (two) times daily. 06/10/21  Yes [provider]  ferrous sulfate 325 (65 FE) MG tablet Take 325 mg by mouth daily with breakfast.   Yes [provider]  folic acid (FOLVITE) 1 MG tablet Take 1 mg by mouth  daily.   Yes [provider]  furosemide (LASIX) 40 MG tablet Take 40 mg by mouth 2 (two) times daily. 07/22/21  Yes [provider]  lisinopril (ZESTRIL) 40 MG tablet Take 1 tablet by mouth daily. 06/10/21  Yes [provider]  metoprolol tartrate (LOPRESSOR) 25 MG tablet Take 1 tablet (25 mg total) by mouth 2 (two) times daily. 05/13/13  Yes Jerline Pain, MD  nitroGLYCERIN (NITROSTAT) 0.4 MG SL tablet Place 1 tablet (0.4 mg total) under the tongue every 5 (five) minutes as needed for chest pain. 05/13/13  Yes Jerline Pain, MD  timolol (TIMOPTIC) 0.5 % ophthalmic solution Place 1 drop into both eyes 2 (two) times daily. 07/22/21  Yes [provider]  aspirin 81 MG chewable tablet Chew 1 tablet (81 mg total) by mouth daily. Patient not taking: Reported on 08/10/2021 05/13/13   Jerline Pain, MD  clopidogrel (PLAVIX) 75 MG tablet Take 1 tablet (75 mg total) by mouth daily with breakfast. Patient not taking: Reported on 08/10/2021 05/14/13   Jerline Pain, MD  warfarin (COUMADIN) 5 MG tablet Take 1 tablet (5 mg total) by mouth daily. Patient not taking: Reported on 08/10/2021 05/13/13   Jerline Pain, MD  Warfarin - Physician Dosing Inpatient MISC 5 each by Does not apply route daily at 6 PM. Patient not taking: Reported on 08/10/2021 05/13/13   Jerline Pain, MD    Physical Exam: Vitals:   08/10/21 0942 08/10/21 1030 08/10/21 1130 08/10/21 1200  BP: 136/66 128/71 140/70 135/74  Pulse: 64 (!) 38 64 66  Resp: (!) 22 (!) 22 19 (!) 26  Temp: 97.6 F (36.4 C)     TempSrc: Oral     SpO2: 100% 94% 98% 92%  Weight:      Height:       Physical Exam Constitutional:      Appearance: He is obese.  HENT:     Head: Normocephalic.  Cardiovascular:     Rate and Rhythm: Normal rate.  Pulmonary:     Breath sounds: Decreased breath sounds present.     Comments: Crackles at bases Abdominal:     General: Abdomen is protuberant. There is distension.     Palpations: Abdomen is soft.  Musculoskeletal:        General: Normal range of motion.  Skin:    General: Skin is warm and dry.  Neurological:     General: No focal deficit present.     Mental Status: He is alert.      Labs on Admission: I have personally reviewed the patients's labs and imaging studies.  Assessment/Plan Principal Problem:   Acute exacerbation of CHF (congestive heart failure) (HCC) Active Problems:   Chronic kidney disease   Acute respiratory failure (HCC)   Hypertension   Atrial fibrillation (HCC)   CAD (coronary artery disease)   Goals of care, counseling/discussion   * Acute exacerbation of CHF (congestive heart failure) (HCC) repeat  echo None in our system -IV lasix Strict I/O -daily weights -baseline Cr: 2.1 -monitor on tele -base weight : 200 lbs   Acute respiratory failure ([place]) -wean to RA as able -does not wear O2 at home  Chronic kidney disease -baseline 2.1 -monitor closely while getting diuresis  CAD (coronary artery disease) Cards in [place]: 05/13/2013 s/p 2.5 x 24 mm promus (des) to mid LAD  had residual stenosis in diag but asymptomatic  - on anticoagulation so no aspirin  on statin  he doesn't want to pursue further evaluation. He declines extra tests or procedures.     Atrial fibrillation (HCC) -chads2vasc is 5 (htn, age, diabetes, vascular) -paroxysmal  -eliquis 2.5 mg bid bc poor renal function -on metoprolol 12.5 mg bid    Hypertension -hold ACE due to worsening CKD  Goals of care, counseling/discussion DNR but would be ok with Bipap Daughter [name]) holds his MPOA- also has a son      DVT Prophylaxis: eliquis    Family Communication: at bedside Admission status: Inpatient Telemetry  Certification: The appropriate patient status for this patient is INPATIENT. Inpatient status is judged to be reasonable and necessary in order to provide the required intensity of service to ensure the patient's safety. The patient's presenting symptoms, physical exam findings, and initial radiographic and laboratory data in the context of their chronic comorbidities is felt to place them at high risk for further clinical deterioration. Furthermore, it is not anticipated that the patient will be medically stable for discharge from the hospital within 2 midnights of admission.   * I certify that at the point of admission it is my clinical judgment that the patient will require inpatient hospital care spanning beyond 2 midnights from the point of admission due to high intensity of service, high risk for further deterioration and high frequency of surveillance required.*     [place]  [facility] If 7PM-7AM, please contact night-coverage [email or web address]  08/10/2021, 1:05 PM

## 2021-08-10 NOTE — Assessment & Plan Note (Signed)
DNR but would be ok with Bipap Daughter [name]) holds his MPOA- also has a son

## 2021-08-10 NOTE — Assessment & Plan Note (Addendum)
repeat echo pending -IV lasix Strict I/O -daily weights -baseline Cr: 2.1 -monitor on tele -base weight : 200 lbs -does have a large abdomen-- ? U/S to r/o ascites and need for paracentesis pending IV diuresis

## 2021-08-10 NOTE — ED Provider Notes (Signed)
[facility] EMERGENCY DEPARTMENT Provider Note   CSN: [id number] Arrival date & time: 08/10/21  1540     History Chief Complaint  Patient presents with   Shortness of Breath    Lawrence Harper is a 87 y.o. male with a history of persistent A. fib on Eliquis, CAD s/p MI, HTN, HLD, obesity, CKD, diabetes, presented emerged department concern for weight gain and fluid buildup.  The patient reports that he has put on 40-50 lbs in the past 2 months.  He reports he is swelling of the abdomen primarily.  He reports worsening orthopnea and dips on exertion.  His son at the bedside reports the patient is having difficulty even getting to the bathroom, which is an acute change for him.  The patient reports that he was "accidentally taken off of Lasix" about a month ago, then restarted on 80 mg 2 weeks ago, which he has been taking.  He denies fevers, chills, cough, chest pain.  Medical records available from [facility], cardiology   Per chart review- patient had been taken off lasix due to CKD, his cardiologist attempted to restart 40 mg PO daily on 07/02/21  HPI     Past Medical History:  Diagnosis Date   Acute myocardial infarction of other anterior wall, initial episode of care    Anginal pain ([place])    Arthritis    Atrial fibrillation ([place])    Cancer ([place])    Chronic kidney disease    Coronary artery disease    Diabetes mellitus without complication (HCC)    Diverticulitis    GERD (gastroesophageal reflux disease)    High cholesterol    Hypertension    Peripheral vascular disease [facility])     Patient Active Problem List   Diagnosis Date Noted   Acute exacerbation of CHF (congestive heart failure) ([place]) 08/10/2021   Goals of care, counseling/discussion 08/10/2021   Acute respiratory failure ([place]) 08/10/2021   Diverticulitis    CAD (coronary artery disease)    Peripheral vascular disease (HCC)    GERD (gastroesophageal reflux disease)    Cancer (HCC)    Chronic  kidney disease    Arthritis    Acute myocardial infarction of other anterior wall, initial episode of care    Diabetes mellitus without complication ([place])    Hypertension    Atrial fibrillation ([place])     Past Surgical History:  Procedure Laterality Date   CHOLECYSTECTOMY     CORONARY ANGIOPLASTY     EYE SURGERY     KNEE SURGERY     KNEE SURGERY     LEFT HEART CATH N/A 05/12/2013   Procedure: LEFT HEART CATH;  Surgeon: Jettie Booze, MD;  Location: [facility] CATH LAB;  Service: Cardiovascular;  Laterality: N/A;       Family History  Problem Relation Age of Onset   Throat cancer Mother 30    Social History   Tobacco Use   Smoking status: Never   Smokeless tobacco: Never  Vaping Use   Vaping Use: Never used  Substance Use Topics   Alcohol use: No   Drug use: No    Home Medications Prior to Admission medications   Medication Sig Start Date End Date Taking? Authorizing Provider  acetaminophen (TYLENOL) 650 MG CR tablet Take 1,300 mg by mouth every 8 (eight) hours as needed for pain.   Yes [provider]  amLODipine (NORVASC) 10 MG tablet Take 10 mg by mouth daily. 07/22/21  Yes [provider]  atorvastatin (LIPITOR) 40 MG tablet Take 40 mg by mouth daily. 07/22/21  Yes [provider]  Calcium Carbonate-Vitamin D (CALCIUM 600 +D HIGH POTENCY) 600-10 MG-MCG TABS Take 1 tablet by mouth daily.   Yes [provider]  ELIQUIS 5 MG TABS tablet Take 2.5 mg by mouth 2 (two) times daily. 06/10/21  Yes [provider]  ferrous sulfate 325 (65 FE) MG tablet Take 325 mg by mouth daily with breakfast.   Yes [provider]  folic acid (FOLVITE) 1 MG tablet Take 1 mg by mouth daily.   Yes [provider]  furosemide (LASIX) 40 MG tablet Take 40 mg by mouth 2 (two) times daily. 07/22/21  Yes [provider]  lisinopril (ZESTRIL) 40 MG tablet Take 1 tablet by mouth daily. 06/10/21  Yes [provider]   metoprolol tartrate (LOPRESSOR) 25 MG tablet Take 1 tablet (25 mg total) by mouth 2 (two) times daily. 05/13/13  Yes Jerline Pain, MD  nitroGLYCERIN (NITROSTAT) 0.4 MG SL tablet Place 1 tablet (0.4 mg total) under the tongue every 5 (five) minutes as needed for chest pain. 05/13/13  Yes Jerline Pain, MD  timolol (TIMOPTIC) 0.5 % ophthalmic solution Place 1 drop into both eyes 2 (two) times daily. 07/22/21  Yes [provider]  aspirin 81 MG chewable tablet Chew 1 tablet (81 mg total) by mouth daily. Patient not taking: Reported on 08/10/2021 05/13/13   Jerline Pain, MD  clopidogrel (PLAVIX) 75 MG tablet Take 1 tablet (75 mg total) by mouth daily with breakfast. Patient not taking: Reported on 08/10/2021 05/14/13   Jerline Pain, MD  warfarin (COUMADIN) 5 MG tablet Take 1 tablet (5 mg total) by mouth daily. Patient not taking: Reported on 08/10/2021 05/13/13   Jerline Pain, MD  Warfarin - Physician Dosing Inpatient MISC 5 each by Does not apply route daily at 6 PM. Patient not taking: Reported on 08/10/2021 05/13/13   Jerline Pain, MD    Allergies    Patient has no known allergies.  Review of Systems   Review of Systems  Constitutional:  Negative for chills and fever.  HENT:  Negative for ear pain and sore throat.   Eyes:  Negative for pain and visual disturbance.  Respiratory:  Positive for shortness of breath. Negative for cough.   Cardiovascular:  Negative for chest pain and palpitations.  Gastrointestinal:  Negative for abdominal pain and vomiting.  Genitourinary:  Negative for dysuria and hematuria.  Musculoskeletal:  Negative for arthralgias and back pain.  Skin:  Negative for color change and rash.  Neurological:  Negative for syncope and headaches.  All other systems reviewed and are negative.  Physical Exam Updated Vital Signs BP (!) 141/88 (BP Location: Left Arm)    Pulse 64    Temp 97.6 F (36.4 C) (Oral)    Resp 18    Ht 5\' 6"  (1.676 m)    Wt 110.5 kg     SpO2 100%    BMI 39.32 kg/m   Physical Exam Constitutional:      General: He is not in acute distress. HENT:     Head: Normocephalic and atraumatic.  Eyes:     Conjunctiva/sclera: Conjunctivae normal.     Pupils: Pupils are equal, round, and reactive to light.  Cardiovascular:     Rate and Rhythm: Normal rate and regular rhythm.  Pulmonary:     Effort: Pulmonary effort is normal. No respiratory distress.  Comments: 94% on 4L [place] Rhonchi lung bases Abdominal:     General: There is distension.     Tenderness: There is no abdominal tenderness. There is no guarding.  Skin:    General: Skin is warm and dry.  Neurological:     General: No focal deficit present.     Mental Status: He is alert. Mental status is at baseline.  Psychiatric:        Mood and Affect: Mood normal.        Behavior: Behavior normal.    ED Results / Procedures / Treatments   Labs (all labs ordered are listed, but only abnormal results are displayed) Labs Reviewed  COMPREHENSIVE METABOLIC PANEL - Abnormal; Notable for the following components:      Result Value   CO2 21 (*)    BUN 70 (*)    Creatinine, Ser 2.86 (*)    Albumin 3.0 (*)    Alkaline Phosphatase 143 (*)    GFR, Estimated 21 (*)    All other components within normal limits  CBC WITH DIFFERENTIAL/PLATELET - Abnormal; Notable for the following components:   RBC 3.07 (*)    Hemoglobin 9.7 (*)    HCT 31.4 (*)    MCV 102.3 (*)    RDW 18.3 (*)    Lymphs Abs 0.6 (*)    All other components within normal limits  BRAIN NATRIURETIC PEPTIDE - Abnormal; Notable for the following components:   B Natriuretic Peptide 522.0 (*)    All other components within normal limits  RESP PANEL BY RT-PCR (FLU A&B, COVID) ARPGX2  MAGNESIUM  BASIC METABOLIC PANEL  TROPONIN I (HIGH SENSITIVITY)  TROPONIN I (HIGH SENSITIVITY)    EKG EKG Interpretation  Date/Time:  Monday August 10 2021 10:41:17 EST Ventricular Rate:  113 PR Interval:    QRS  Duration: 121 QT Interval:  416 QTC Calculation: 571 R Axis:   -10 Text Interpretation: Atrial fibrillation Nonspecific intraventricular conduction delay Confirmed by Octaviano Glow [phone]) on 08/10/2021 10:48:32 AM  Radiology DG Chest 2 View  Result Date: 08/10/2021 CLINICAL DATA:  87 year old male with a history of shortness of breath EXAM: CHEST - 2 VIEW COMPARISON:  None. FINDINGS: Cardiomediastinal silhouette enlarged with cardiomegaly. Calcifications of the aortic arch. Interlobular septal thickening. Low lung volumes. Opacity at the left lung base with obscuration of the left hemidiaphragm and the left heart border. Opacity at the posterior lung base on the lateral view. No pneumothorax.  No right-sided pleural effusion. Degenerative changes of the spine. IMPRESSION: Low lung volumes, with evidence acute CHF and left-sided pleural effusion. Cardiomegaly and aortic atherosclerosis. Electronically Signed   By: Corrie Mckusick D.O.   On: 08/10/2021 11:26    Procedures .Critical Care Performed by: Wyvonnia Dusky, MD Authorized by: Wyvonnia Dusky, MD   Critical care provider statement:    Critical care time (minutes):  45   Critical care time was exclusive of:  Separately billable procedures and treating other patients   Critical care was necessary to treat or prevent imminent or life-threatening deterioration of the following conditions:  Circulatory failure and cardiac failure   Critical care was time spent personally by me on the following activities:  Ordering and performing treatments and interventions, ordering and review of laboratory studies, ordering and review of radiographic studies, pulse oximetry, review of old charts, examination of patient and evaluation of patient's response to treatment   Medications Ordered in ED Medications  acetaminophen (TYLENOL) tablet 1,000 mg (has no administration  in time range)  atorvastatin (LIPITOR) tablet 40 mg (has no administration in  time range)  metoprolol tartrate (LOPRESSOR) tablet 25 mg (has no administration in time range)  nitroGLYCERIN (NITROSTAT) SL tablet 0.4 mg (has no administration in time range)  apixaban (ELIQUIS) tablet 2.5 mg (has no administration in time range)  ferrous sulfate tablet 325 mg (has no administration in time range)  folic acid (FOLVITE) tablet 1 mg (has no administration in time range)  calcium-vitamin D (OSCAL WITH D) 500-5 MG-MCG per tablet 1 tablet (has no administration in time range)  timolol (TIMOPTIC) 0.5 % ophthalmic solution 1 drop (has no administration in time range)  sodium chloride flush (NS) 0.9 % injection 3 mL (has no administration in time range)  sodium chloride flush (NS) 0.9 % injection 3 mL (has no administration in time range)  0.9 %  sodium chloride infusion (has no administration in time range)  ondansetron (ZOFRAN) injection 4 mg (has no administration in time range)  furosemide (LASIX) injection 80 mg (has no administration in time range)  furosemide (LASIX) injection 80 mg (80 mg Intravenous Given 08/10/21 1021)    ED Course  I have reviewed the triage vital signs and the nursing notes.  Pertinent labs & imaging results that were available during my care of the patient were reviewed by me and considered in my medical decision making (see chart for details).  Patient is here with suspected worsening congestive heart failure in the setting of chronic kidney disease.  He does have significant abdominal distention and reports a 40 to 50 pound weight gain, which I suspect may indeed be fluid.  Supplemental history is obtained by the patient's son at bedside.  I personally read his prior medical records including his outpatient most recent cardiology telephone and office notes from [facility].  His monitor tracing and EKG on arrival show A. fib which is rate controlled, no STEMI.  No chest pain.  Labs reviewed and interpreted showing trop low, flat, BNP elevated  522, Cr and BUN elevated I ordered, interpreted, and reviewed the patient's imaging which included chest x-ray, showing pulm edema  IV diuresis ordered for suspected CHF exacerbation  Clinical Course as of 08/10/21 1713  Mon Aug 10, 2021  1214 Reassessed, pt on 4L [place] now, 90% O2, produced 1/2 liter urine so far.  Anticipate admission for CHF [MT]  1237 Pt admitted to Dr Lucianne Lei hospitalist [MT]    Clinical Course User Index [MT] Langston Masker Carola Rhine, MD    Final Clinical Impression(s) / ED Diagnoses Final diagnoses:  Acute on chronic congestive heart failure, unspecified heart failure type [facility])  Hypoxia    Rx / DC Orders ED Discharge Orders     None        Wyvonnia Dusky, MD 08/10/21 1714

## 2021-08-10 NOTE — Assessment & Plan Note (Signed)
-  wean to RA as able -does not wear O2 at home

## 2021-08-10 NOTE — Assessment & Plan Note (Signed)
Cards in [place]: 05/13/2013 s/p 2.5 x 24 mm promus (des) to mid LAD  had residual stenosis in diag but asymptomatic  - on anticoagulation so no aspirin  on statin   he doesn't want to pursue further evaluation. He declines extra tests or procedures.

## 2021-08-10 NOTE — Assessment & Plan Note (Addendum)
-  chads2vasc is 5 (htn, age, diabetes, vascular) -paroxysmal  -eliquis 2.5 mg bid bc poor renal function -on metoprolol

## 2021-08-10 NOTE — ED Notes (Signed)
O2 increased to 3 lpm.

## 2021-08-11 ENCOUNTER — Inpatient Hospital Stay (HOSPITAL_COMMUNITY): Payer: Medicare (Managed Care)

## 2021-08-11 ENCOUNTER — Encounter (HOSPITAL_COMMUNITY): Payer: Self-pay | Admitting: Internal Medicine

## 2021-08-11 DIAGNOSIS — I350 Nonrheumatic aortic (valve) stenosis: Secondary | ICD-10-CM

## 2021-08-11 DIAGNOSIS — I10 Essential (primary) hypertension: Secondary | ICD-10-CM | POA: Diagnosis not present

## 2021-08-11 DIAGNOSIS — E785 Hyperlipidemia, unspecified: Secondary | ICD-10-CM

## 2021-08-11 DIAGNOSIS — J9601 Acute respiratory failure with hypoxia: Secondary | ICD-10-CM | POA: Diagnosis not present

## 2021-08-11 DIAGNOSIS — N189 Chronic kidney disease, unspecified: Secondary | ICD-10-CM

## 2021-08-11 DIAGNOSIS — I455 Other specified heart block: Secondary | ICD-10-CM

## 2021-08-11 DIAGNOSIS — I4821 Permanent atrial fibrillation: Secondary | ICD-10-CM

## 2021-08-11 DIAGNOSIS — I5033 Acute on chronic diastolic (congestive) heart failure: Secondary | ICD-10-CM

## 2021-08-11 LAB — BASIC METABOLIC PANEL
Anion gap: 8 (ref 5–15)
BUN: 70 mg/dL — ABNORMAL HIGH (ref 8–23)
CO2: 24 mmol/L (ref 22–32)
Calcium: 8.9 mg/dL (ref 8.9–10.3)
Chloride: 106 mmol/L (ref 98–111)
Creatinine, Ser: 2.77 mg/dL — ABNORMAL HIGH (ref 0.61–1.24)
GFR, Estimated: 21 mL/min — ABNORMAL LOW (ref >60)
Glucose, Bld: 91 mg/dL (ref 70–99)
Potassium: 4.9 mmol/L (ref 3.5–5.1)
Sodium: 138 mmol/L (ref 135–145)

## 2021-08-11 LAB — ECHOCARDIOGRAM COMPLETE
AR max vel: 0.9 cm2
AV Area VTI: 0.89 cm2
AV Area mean vel: 0.95 cm2
AV Mean grad: 15.5 mmHg
AV Peak grad: 31.8 mmHg
Ao pk vel: 2.82 m/s
Area-P 1/2: 2.93 cm2
Height: 66 in
MV VTI: 2 cm2
S' Lateral: 2.6 cm
Weight: 3880.1 oz

## 2021-08-11 LAB — RESP PANEL BY RT-PCR (FLU A&B, COVID) ARPGX2
Influenza A by PCR: NEGATIVE
Influenza B by PCR: NEGATIVE
SARS Coronavirus 2 by RT PCR: NEGATIVE

## 2021-08-11 MED ORDER — METOPROLOL TARTRATE 25 MG PO TABS: 12.5000 mg | ORAL_TABLET | Freq: Two times a day (BID) | ORAL | Status: DC

## 2021-08-11 MED ORDER — PERFLUTREN LIPID MICROSPHERE
1.0000 mL | INTRAVENOUS | Status: AC | PRN
  Administered 2021-08-11: 15:00:00 5 mL via INTRAVENOUS
  Filled 2021-08-11: qty 10

## 2021-08-11 NOTE — Progress Notes (Signed)
Progress Note    Lawrence Harper   ZOX:[id number]  DOB: 1932-04-24  DOA: 08/10/2021     1 Date of Service: 08/11/2021   Clinical Course Lawrence Harper is a 87 y.o. male with medical history significant of paroxysmal A. fib, coronary artery disease, hypertension, obesity, chronic kidney disease, and diastolic heart failure.  Patient splits time between [facility] and [place].  Most of patient's primary care and specialist reside in [place], fortunately these records are available through epic for review.  Patient states that he has gained 40 to 50 pounds in the last 2 months.  Apparently patient had stopped taking his Lasix for a time period and was just resumed about a month ago his doctors in [place] have been encouraging him to come to the hospital for about 10 days.  Patient states that the swelling in his abdomen and his shortness of breath with exertion has continued to get worse prompting him to come to the ER. He states his baseline weight is around 200 pounds.  Weight in the ER is 245 pounds. Stay complicated with a pause of 4 seconds  Assessment and Plan * Acute exacerbation of CHF (congestive heart failure) ([place]) repeat echo pending -IV lasix Strict I/O -daily weights -baseline Cr: 2.1 -monitor on tele -base weight : 200 lbs -does have a large abdomen-- ? U/S to r/o ascites and need for paracentesis pending IV diuresis   Sinus pause -cards consult -while sleeping but no history of sleep apnea and never been tested -on BB  Acute respiratory failure ([place]) -wean to RA as able -does not wear O2 at home  Chronic kidney disease -baseline 2.1 -monitor closely while getting diuresis  CAD (coronary artery disease) Cards in [place]: 05/13/2013 s/p 2.5 x 24 mm promus (des) to mid LAD  had residual stenosis in diag but asymptomatic  - on anticoagulation so no aspirin  on statin   he doesn't want to pursue further evaluation. He declines extra tests or procedures.      Atrial fibrillation (HCC) -chads2vasc is 5 (htn, age, diabetes, vascular) -paroxysmal  -eliquis 2.5 mg bid bc poor renal function -on metoprolol    Hypertension -hold ACE due to worsening CKD  Goals of care, counseling/discussion DNR but would be ok with Bipap Daughter Lawrence Harper) holds his MPOA- also has a son     Subjective:  Pause of 4 seconds last PM Was able to come off O2 this AM  Objective Vitals:   08/11/21 0140 08/11/21 0508 08/11/21 0815 08/11/21 1047  BP: 114/60 119/62    Pulse: (!) 51 (!) 54    Resp: 18 17 18 16   Temp: 98 F (36.7 C) 97.8 F (36.6 C)    TempSrc: Oral Oral    SpO2: 96% 100% 95% 96%  Weight:  110 kg    Height:       110 kg     Exam  General: Appearance:    Obese male in no acute distress     Lungs:     Off O2, diminished, respirations unlabored  Heart:    Bradycardic.   MS:   All extremities are intact. Swelling in feet   Neurologic:   Awake, alert, oriented x 3. No apparent focal neurological           defect.      Labs / Other Information Cr trending down   Disposition Plan: Status is: Inpatient  Remains inpatient appropriate because: needs continued IV diuresis  Eulogio Bear DO [facility] 08/11/2021, 11:20 AM

## 2021-08-11 NOTE — Progress Notes (Signed)
Provider at Bedside to evaluate patient from patient having 4 second pause on telemetry. No new orders at this time. Will continue to monitor.

## 2021-08-11 NOTE — Plan of Care (Signed)
°  Problem: Acute Rehab PT Goals(only PT should resolve) Goal: Patient Will Transfer Sit To/From Stand Outcome: Progressing Flowsheets (Taken 08/11/2021 1258) Patient will transfer sit to/from stand: with modified independence Goal: Pt Will Transfer Bed To Chair/Chair To Bed Outcome: Progressing Flowsheets (Taken 08/11/2021 1258) Pt will Transfer Bed to Chair/Chair to Bed: with modified independence Goal: Pt Will Ambulate Outcome: Progressing Flowsheets (Taken 08/11/2021 1258) Pt will Ambulate:  100 feet  with supervision  with least restrictive assistive device Goal: Pt/caregiver will Perform Home Exercise Program Outcome: Progressing Flowsheets (Taken 08/11/2021 1258) Pt/caregiver will Perform Home Exercise Program:  For increased strengthening  For improved balance  Independently  12:59 PM, 08/11/21 Mearl Latin PT, DPT Physical Therapist at [facility]

## 2021-08-11 NOTE — Assessment & Plan Note (Signed)
-  cards consult -while sleeping but no history of sleep apnea and never been tested -on BB

## 2021-08-11 NOTE — Progress Notes (Signed)
Continuous pulse ox setup for overnight study. Nurse informed

## 2021-08-11 NOTE — Consult Note (Addendum)
Cardiology Consultation:   Patient ID: Lawrence Harper MRN: [id number]; DOB: 22-Oct-1931  Admit date: 08/10/2021 Date of Consult: 08/11/2021  PCP:  System, Provider Not In   [place] Providers Cardiologist:  None Dr. Cornelious Bryant with [facility]) in [place], [place]        Patient Profile:   Lawrence Harper is a 87 y.o. male with a hx of CAD s/p NSTEMI in 9/14 tx with DES to LAD, permanent atrial fibrillation, HFpEF, ASD, hypertension, hyperlipidemia, diabetes mellitus, chronic kidney disease (Creatinine in [place] since 04/2020 ranges 1.6-2.8; last SCr 2.1 in 9/22), GERD, peripheral arterial disease, glaucoma, obesity who is being seen 08/11/2021 for the evaluation of acute congestive heart failure, 4 second pause on tele at the request of Dr. Eliseo Squires.  History of Present Illness:   Mr. Habig was last seen in our practice in 2014 by Dr. Irish Lack.  He lives part of the time here and part of the time in [place].  Most of his cardiac care has been taken care of in [place].  He sees Dr. Dalia Heading in [place].  The last several EKGs in [place] indicate he has been in AFib persistently.  He presented to the hospital with volume overload.  He had been off of his furosemide for some time and just resumed it about 1 month ago.  His weight is up 40-50 lbs.  His MD in [place] recommended coming to the hospital.  He ultimately presented to [facility] with worsening abdominal distention and shortness of breath.  He has been started on IV furosemide.  He was noted to have > 4 sec pause during sleep early this AM and Cardiology is asked to further evaluate.    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  His brother in law recently died.  He was caring for the farm and they are working on finding a new caretaker.  He sees nephrology in [place] and was taken off of furosemide due to worsening renal function.  He started to gain fluid while off furosemide.  He has noted shortness of breath with exertion as well as orthopnea.  He has some leg edema (R>L) but mainly has increased abdominal girth.  He also notes scrotal edema.  He has not had chest pain, syncope, near syncope.  He does snore.  He takes a daily nap at 2 pm.  Today is his anniversary (married > 60 years).    Past Medical History:  Diagnosis Date   (HFpEF) heart failure with preserved ejection fraction ([place])    Echocardiogram 03/16/16 [facility] in [place]): EF 8, normal RVSF, mod to severe TR, mild AS (mean 10 mmHg), trace AI, mild PI, mild MR, small secundum ASD w L-R shunt, RVSP 40   Arthritis    Atrial fibrillation (HCC)    Cancer (HCC)    Chronic kidney disease    Coronary artery disease    NSTEMI 9/14 >> Cath: LM ok, pLAD 50, mLAD 95, pD1 80; LCx/RI/OM1/OM2/LPDA/RCA patent >> PCI: 2.5 x 24 mm Promus DES to mLAD   Diabetes mellitus without complication ([place])    Diverticulitis  GERD (gastroesophageal reflux disease)    High cholesterol    History of non-ST elevation myocardial infarction in 2014 s/p DES to LAD    Hypertension    Peripheral vascular disease ([place])     Past Surgical History:  Procedure Laterality Date   CHOLECYSTECTOMY     CORONARY ANGIOPLASTY     EYE SURGERY     KNEE SURGERY     KNEE SURGERY     LEFT HEART CATH N/A 05/12/2013   Procedure: LEFT HEART CATH;  Surgeon: Jettie Booze, MD;  Location: [facility] CATH LAB;  Service: Cardiovascular;  Laterality: N/A;     Home Medications:  Prior to Admission medications   Medication Sig Start Date End Date Taking? Authorizing Provider  acetaminophen (TYLENOL) 650 MG CR tablet Take 1,300 mg by mouth every 8 (eight) hours as needed for pain.   Yes  [provider]  amLODipine (NORVASC) 10 MG tablet Take 10 mg by mouth daily. 07/22/21  Yes [provider]  atorvastatin (LIPITOR) 40 MG tablet Take 40 mg by mouth daily. 07/22/21  Yes [provider]  Calcium Carbonate-Vitamin D (CALCIUM 600 +D HIGH POTENCY) 600-10 MG-MCG TABS Take 1 tablet by mouth daily.   Yes [provider]  ELIQUIS 5 MG TABS tablet Take 2.5 mg by mouth 2 (two) times daily. 06/10/21  Yes [provider]  ferrous sulfate 325 (65 FE) MG tablet Take 325 mg by mouth daily with breakfast.   Yes [provider]  folic acid (FOLVITE) 1 MG tablet Take 1 mg by mouth daily.   Yes [provider]  furosemide (LASIX) 40 MG tablet Take 40 mg by mouth 2 (two) times daily. 07/22/21  Yes [provider]  lisinopril (ZESTRIL) 40 MG tablet Take 1 tablet by mouth daily. 06/10/21  Yes [provider]  metoprolol tartrate (LOPRESSOR) 25 MG tablet Take 1 tablet (25 mg total) by mouth 2 (two) times daily. 05/13/13  Yes Jerline Pain, MD  nitroGLYCERIN (NITROSTAT) 0.4 MG SL tablet Place 1 tablet (0.4 mg total) under the tongue every 5 (five) minutes as needed for chest pain. 05/13/13  Yes Jerline Pain, MD  timolol (TIMOPTIC) 0.5 % ophthalmic solution Place 1 drop into both eyes 2 (two) times daily. 07/22/21  Yes [provider]  aspirin 81 MG chewable tablet Chew 1 tablet (81 mg total) by mouth daily. Patient not taking: Reported on 08/10/2021 05/13/13   Jerline Pain, MD  clopidogrel (PLAVIX) 75 MG tablet Take 1 tablet (75 mg total) by mouth daily with breakfast. Patient not taking: Reported on 08/10/2021 05/14/13   Jerline Pain, MD  warfarin (COUMADIN) 5 MG tablet Take 1 tablet (5 mg total) by mouth daily. Patient not taking: Reported on 08/10/2021 05/13/13   Jerline Pain, MD  Warfarin - Physician Dosing Inpatient MISC 5 each by Does not apply route daily at 6 PM. Patient not taking: Reported on  08/10/2021 05/13/13   Jerline Pain, MD    Inpatient Medications: Scheduled Meds:  apixaban  2.5 mg Oral BID   atorvastatin  40 mg Oral q1800   calcium-vitamin D  1 tablet Oral Daily   ferrous sulfate  325 mg Oral Q breakfast   folic acid  1 mg Oral Daily   furosemide  80 mg Intravenous Q12H   metoprolol tartrate  12.5 mg Oral BID   nystatin   Topical TID   sodium chloride flush  3 mL Intravenous Q12H  timolol  1 drop Both Eyes BID   Continuous Infusions:  sodium chloride     PRN Meds: sodium chloride, acetaminophen, nitroGLYCERIN, ondansetron (ZOFRAN) IV, sodium chloride flush  Allergies:   No Known Allergies  Social History:   Social History   Socioeconomic History   Marital status: Married    Spouse name: Not on file   Number of children: Not on file   Years of education: Not on file   Highest education level: Not on file  Occupational History   Not on file  Tobacco Use   Smoking status: Never   Smokeless tobacco: Never  Vaping Use   Vaping Use: Never used  Substance and Sexual Activity   Alcohol use: No   Drug use: No   Sexual activity: Not on file  Other Topics Concern   Not on file  Social History Narrative   Not on file   Social Determinants of Health   Financial Resource Strain: Not on file  Food Insecurity: Not on file  Transportation Needs: Not on file  Physical Activity: Not on file  Stress: Not on file  Social Connections: Not on file  Intimate Partner Violence: Not on file  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   Family History:   Family History  Problem Relation Age of Onset   Throat cancer Mother 59     ROS:  Please see the history of present illness.  No fever, cough, melena, hematochezia, hematuria.  All other ROS reviewed and negative.     Physical Exam/Data:   Vitals:   08/11/21 0140 08/11/21 0508 08/11/21 0815 08/11/21 1047  BP: 114/60 119/62    Pulse: (!) 51 (!) 54    Resp: 18 17 18 16   Temp:  98 F (36.7 C) 97.8 F (36.6 C)    TempSrc: Oral Oral    SpO2: 96% 100% 95% 96%  Weight:  110 kg    Height:        Intake/Output Summary (Last 24 hours) at 08/11/2021 1133 Last data filed at 08/11/2021 0941 Gross per 24 hour  Intake 596 ml  Output 1675 ml  Net -1079 ml   Last 3 Weights 08/11/2021 08/10/2021 08/10/2021  Weight (lbs) 242 lb 8.1 oz 243 lb 9.7 oz 245 lb  Weight (kg) 110 kg 110.5 kg 111.131 kg     Body mass index is 39.14 kg/m.  General:  Well nourished, well developed, in no acute distress  HEENT: normal Neck: JVP to angle of jaw Vascular: No carotid bruits  Cardiac:  normal S1, S2; irreg irreg rhythm; 2/6 systolic murmur at RUSB and LUSB (Crescendo-Decrescendo) Lungs:  decreased breath sounds; faint crackles R base; L basilar rales 1/2 up chest Abd: distended/edematous Ext: 2+ edema (R>L) w chronic changes Musculoskeletal:  No deformities GU: +scrotal edema Skin: warm and dry  Neuro:  CNs 2-12 intact, no focal abnormalities noted Psych:  Normal affect   EKG:  The EKG was personally reviewed and demonstrates:  see HPI Telemetry:  Telemetry was personally reviewed and demonstrates:  see HPI  Relevant CV Studies: Echocardiogram is pending  Laboratory Data:  High Sensitivity Troponin:   Recent Labs  Lab 08/10/21 1036 08/10/21 1220  TROPONINIHS 10 9     Chemistry Recent Labs  Lab 08/10/21 1036 08/11/21 0618  NA 136 138  K 4.7 4.9  CL 104 106  CO2 21* 24  GLUCOSE 91 91  BUN 70* 70*  CREATININE 2.86* 2.77*  CALCIUM  8.9 8.9  MG 2.2  --   GFRNONAA 21* 21*  ANIONGAP 11 8    Recent Labs  Lab 08/10/21 1036  PROT 8.1  ALBUMIN 3.0*  AST 20  ALT 12  ALKPHOS 143*  BILITOT 0.7   Lipids No results for input(s): CHOL, TRIG, HDL, LABVLDL, LDLCALC, CHOLHDL in the last 168 hours.  Hematology Recent Labs  Lab 08/10/21 1036  WBC 6.2  RBC 3.07*  HGB 9.7*  HCT 31.4*  MCV 102.3*  MCH 31.6  MCHC 30.9  RDW 18.3*  PLT 287   Thyroid No  results for input(s): TSH, FREET4 in the last 168 hours.  BNP Recent Labs  Lab 08/10/21 1036  BNP 522.0*    DDimer No results for input(s): DDIMER in the last 168 hours.   Radiology/Studies:  DG Chest 2 View  Result Date: 08/10/2021 CLINICAL DATA:  87 year old male with a history of shortness of breath EXAM: CHEST - 2 VIEW COMPARISON:  None. FINDINGS: Cardiomediastinal silhouette enlarged with cardiomegaly. Calcifications of the aortic arch. Interlobular septal thickening. Low lung volumes. Opacity at the left lung base with obscuration of the left hemidiaphragm and the left heart border. Opacity at the posterior lung base on the lateral view. No pneumothorax.  No right-sided pleural effusion. Degenerative changes of the spine. IMPRESSION: Low lung volumes, with evidence acute CHF and left-sided pleural effusion. Cardiomegaly and aortic atherosclerosis. Electronically Signed   By: Corrie Mckusick D.O.   On: 08/10/2021 11:26     Assessment and Plan:   1. Acute on chronic (HFpEF) heart failure with preserved ejection fraction  Massive volume overload/anasarca in the setting of chronic kidney disease.  His Creatinine had increased recently and was most recently 2.1 prior to admission.  He has had fair diuresis so far.  He still has a long way to go.  He notes he feels much better since starting on Furosemide IV.  He has some evidence of R sided HF on exam as well as L sided.  He also has a hx of mild AS.  He has  a notable murmur on exam.  We will need to make sure aortic stenosis has not worsened.  Echocardiogram is pending.  Would continue Furosemide 80 mg IV twice daily for now.  He would not be a good candidate for MRA with chronic kidney disease.  GFR is ~ 20.  Would avoid SGLT2i for now as GFR is close to the cutoff.  Follow renal function closely with diuresis.  We will follow with you.   2. Permanent atrial fibrillation Rate is well controlled.  Metoprolol has been held due to pause this  AM.  Ok to stop metoprolol tartrate for now.  Monitor for increased HR.  Continue Apixaban 2.5 mg twice daily (age > 80, SCr > 1.5).    3. Nocturnal pause 4 sec pause during sleep not likely significant. Hold beta-blocker.  He has a hx of snoring.  Will get overnight oximetry to screen for OSA.  Echocardiogram is pending.    4. Coronary artery disease  Hx of NSTEMI in 2014 tx with DES to LAD.  He did have residual pLAD and Dx disease at that time that was tx medically.  No angina.  His hs-Trops have been negative.  He is not on ASA as he is on Apixaban.  Continue Atorvastatin.    5. Hypertension  Controlled.    6. Chronic kidney disease Follow renal function closely with diuresis.  7. Hyperlipidemia  Continue atorvastatin  40 mg once daily.  8. Aortic stenosis Mild by echocardiogram in 2017.  Repeat echocardiogram pending.    Risk Assessment/Risk Scores:        New York Heart Association (NYHA) Functional Class NYHA Class III  CHA2DS2-VASc Score = 6   This indicates a 9.7% annual risk of stroke. The patient's score is based upon: CHF History: 1 HTN History: 1 Diabetes History: 1 Stroke History: 0 Vascular Disease History: 1 Age Score: 2 Gender Score: 0         For questions or updates, please contact [facility] Please consult [email or web address] for contact info under    Signed, Richardson Dopp, PA-C  08/11/2021 11:33 AM   Patient seen and examined  I agree with findings as noted by Kathleen Argue above Pt is an 87 yo with known CAD   NSTEMI in 2014  s/p DES to LAD  Also chronic afib , HL, HTN, HFpEF and renal insufficiency    Earlier this fall was seen by MD in [place] and diuretic was stopped     Pt presents with increased edema, abdominal distension and SOB Started on IV lasix    Note on tele last night had 4 sec pause  ON exam, Pt is currently comfortable in bed Neck   Increased JVP    No bruits Lungs   Rales at bases Cardiac exam   RRR   Decreased S2   Gr II/VI systolic  murmur LSB Abd is distended   Nontender Ext with 1+ edema  Echo ordered    WIll review Agree with IV diuresis    Follow urine output and Cr Keep on Eliquis for afib     Continue telemetry   For nocturnal pause would hold b blocker and follow   Check O2 sats through night   Dorris Carnes MD

## 2021-08-11 NOTE — Progress Notes (Signed)
*  PRELIMINARY RESULTS* Echocardiogram 2D Echocardiogram has been performed.  Lawrence Harper 08/11/2021, 3:39 PM

## 2021-08-11 NOTE — Progress Notes (Signed)
Patient had a 4 second pause on telemetry. Camera operator and provider notified.

## 2021-08-11 NOTE — TOC Initial Note (Signed)
Transition of Care [facility]) - Initial/Assessment Note    Patient Details  Name: Lawrence Harper MRN: [id number] Date of Birth: 06-Jan-1932  Transition of Care [facility]) CM/SW Contact:    Iona Beard, [place] Phone Number: 08/11/2021, 2:48 PM  Clinical Narrative:                 CSW spoke outside pts room with pts son due to pt having ultrasound in room. Pt lives with his wife. Pts son and daughter live on pts property and check in with pt daily. Pt is independent in completing his ADLs and can provide his own transportation when needed. Pt has not had [facility] services in the past. Pt has a cane and walker to use in the home when needed. CSW spoke with pts son and daughter about if pt would be interested in [facility] services at D/C. They would like this for pt. CSW spoke to [place] who states they do not accept pts insurance. CSW spoke to [place] with [facility] who states he will see if their office can accept. TOC to follow.   Expected Discharge Plan: [place] Barriers to Discharge: Continued Medical Work up   Patient Goals and CMS Choice Patient states their goals for this hospitalization and ongoing recovery are:: Home with [facility] CMS Medicare.gov Compare Post Acute Care list provided to:: Patient Choice offered to / list presented to : Patient  Expected Discharge Plan and Services Expected Discharge Plan: [place] In-house Referral: Clinical Social Work Discharge Planning Services: CM Consult Post Acute Care Choice: [place] arrangements for the past 2 months: [place]                                      Prior Living Arrangements/Services Living arrangements for the past 2 months: Single Family Home Lives with:: Spouse Patient language and need for interpreter reviewed:: Yes Do you feel safe going back to the place where you live?: Yes      Need for Family Participation in Patient Care: Yes (Comment) Care giver  support system in place?: Yes (comment) Current home services: DME Criminal Activity/Legal Involvement Pertinent to Current Situation/Hospitalization: No - Comment as needed  Activities of Daily Living Home Assistive Devices/Equipment: Eyeglasses, Dentures (specify type), Cane (specify quad or straight), Wheelchair, Environmental consultant (specify type) ADL Screening (condition at time of admission) Patient's cognitive ability adequate to safely complete daily activities?: Yes Is the patient deaf or have difficulty hearing?: No Does the patient have difficulty seeing, even when wearing glasses/contacts?: No Does the patient have difficulty concentrating, remembering, or making decisions?: No Patient able to express need for assistance with ADLs?: Yes Does the patient have difficulty dressing or bathing?: Yes Independently performs ADLs?: Yes (appropriate for developmental age) Does the patient have difficulty walking or climbing stairs?: Yes Weakness of Legs: Both Weakness of Arms/Hands: None  Permission Sought/Granted                  Emotional Assessment Appearance:: Appears stated age     Orientation: : Oriented to Self, Oriented to Place, Oriented to  Time, Oriented to Situation Alcohol / Substance Use: Not Applicable Psych Involvement: No (comment)  Admission diagnosis:  Hypoxia [R09.02] Acute exacerbation of CHF (congestive heart failure) ([place]) [I50.9] Acute on chronic congestive heart failure, unspecified heart failure type [facility]) [I50.9] Patient Active Problem List  Diagnosis Date Noted   Sinus pause 08/11/2021   Acute exacerbation of CHF (congestive heart failure) ([place]) 08/10/2021   Goals of care, counseling/discussion 08/10/2021   Acute respiratory failure ([place]) 08/10/2021   Diverticulitis    CAD (coronary artery disease)    Peripheral vascular disease (HCC)    GERD (gastroesophageal reflux disease)    Cancer (HCC)    Chronic kidney disease    Arthritis    Acute myocardial  infarction of other anterior wall, initial episode of care    Diabetes mellitus without complication ([place])    Hypertension    Atrial fibrillation ([place])    PCP:  System, Provider Not In Pharmacy:   [place], [place] - [address] [place] [address] Phone: [phone] Fax: [phone]     Social Determinants of Health (SDOH) Interventions    Readmission Risk Interventions Readmission Risk Prevention Plan 08/11/2021  Transportation Screening Complete  Home Care Screening Complete  Medication Review (RN CM) Complete  Some recent data might be hidden

## 2021-08-11 NOTE — Progress Notes (Signed)
Pt assisted up OOB and into recliner. Pt steady on feet, small, slow steps. Pt states usually uses cane at home, just hasn't been up as much due to swelling and shortness of breath. Pt tolerated movement without increased SOB. SaO2 100% on 4lpm [place]. Pt took O2 off for am washup and to clean teeth. Off O2 for >5 minutes, SaO2 checked showing 96% on RA with no increased SOB or dyspnea. Pt talkative, pleasant. SaO2 rechecked at 15 minutes off O2, remains 95% on room air. Pt advised to call for any increased SOB or resp difficulty, states understanding. Call bell in reach.

## 2021-08-11 NOTE — Evaluation (Signed)
Physical Therapy Evaluation Patient Details Name: Lawrence Harper MRN: [id number] DOB: [birth year] Today's Date: 08/11/2021  History of Present Illness  Lawrence Harper is a 87 y.o. male with medical history significant of paroxysmal A. fib, coronary artery disease, hypertension, obesity, chronic kidney disease, and diastolic heart failure.  Patient splits time between [facility] and [place].  Most of patient's primary care and specialist reside in [place], fortunately these records are available through epic for review.  Patient states that he has gained 40 to 50 pounds in the last 2 months.  Apparently patient had stopped taking his Lasix for a time period and was just resumed about a month ago his doctors in [place] have been encouraging him to come to the hospital for about 10 days.  Patient states that the swelling in his abdomen and his shortness of breath with exertion has continued to get worse prompting him to come to the ER.  He states his baseline weight is around 200 pounds.  Weight in the ER is 245 pounds.   Clinical Impression  Patient limited for functional mobility as stated below secondary to BLE weakness, fatigue and impaired standing balance. Patient seated in chair at beginning of session. Patient requires use of RW for standing mobility. He is able to transfer to standing and ambulate without physical assist but does complete mobility with slow, labored movements. Patient completes session on room air without SOB. He returns to chair at end of session. Patient will benefit from continued physical therapy in hospital and recommended venue below to increase strength, balance, endurance for safe ADLs and gait.        Recommendations for follow up therapy are one component of a multi-disciplinary discharge planning process, led by the attending physician.  Recommendations may be updated based on patient status, additional functional criteria and insurance authorization.  Follow  Up Recommendations Home health PT    Assistance Recommended at Discharge Intermittent Supervision/Assistance  Functional Status Assessment Patient has had a recent decline in their functional status and demonstrates the ability to make significant improvements in function in a reasonable and predictable amount of time.  Equipment Recommendations  None recommended by PT    Recommendations for Other Services       Precautions / Restrictions Precautions Precautions: Fall Restrictions Weight Bearing Restrictions: No      Mobility  Bed Mobility               General bed mobility comments: seated in chair at beginning of session    Transfers Overall transfer level: Needs assistance Equipment used: Rolling walker (2 wheels) Transfers: Sit to/from Stand Sit to Stand: Min guard           General transfer comment: labored, slow with RW    Ambulation/Gait Ambulation/Gait assistance: Min guard Gait Distance (Feet): 40 Feet Assistive device: Rolling walker (2 wheels) Gait Pattern/deviations: Step-through pattern;Trunk flexed;Decreased stride length Gait velocity: decreased     General Gait Details: slow cadence with RW, no loss of balance, on RA without SOB  Stairs            Wheelchair Mobility    Modified Rankin (Stroke Patients Only)       Balance Overall balance assessment: Needs assistance Sitting-balance support: Feet supported;No upper extremity supported Sitting balance-Leahy Scale: Good Sitting balance - Comments: seated Edge of chair   Standing balance support: Bilateral upper extremity supported;Reliant on assistive device for balance Standing balance-Leahy Scale: Good Standing balance comment: with RW  Pertinent Vitals/Pain Pain Assessment: No/denies pain    Home Living Family/patient expects to be discharged to:: Private residence Living Arrangements: Spouse/significant other Available Help at  Discharge: Family Type of Home: House         Home Layout: Two level;Able to live on main level with bedroom/bathroom Home Equipment: Rolling Walker (2 wheels);Wheelchair - Regulatory affairs officer - single point;Grab bars - toilet;Grab bars - tub/shower      Prior Function Prior Level of Function : Independent/Modified Independent             Mobility Comments: household ambulator with RW over last few weeks, SPC before decline ADLs Comments: independent     Hand Dominance        Extremity/Trunk Assessment   Upper Extremity Assessment Upper Extremity Assessment: Overall WFL for tasks assessed    Lower Extremity Assessment Lower Extremity Assessment: Generalized weakness    Cervical / Trunk Assessment Cervical / Trunk Assessment: Kyphotic  Communication   Communication: No difficulties  Cognition Arousal/Alertness: Awake/alert Behavior During Therapy: WFL for tasks assessed/performed Overall Cognitive Status: Within Functional Limits for tasks assessed                                          General Comments      Exercises     Assessment/Plan    PT Assessment Patient needs continued PT services  PT Problem List Decreased strength;Decreased activity tolerance;Decreased balance;Decreased mobility;Cardiopulmonary status limiting activity       PT Treatment Interventions DME instruction;Balance training;Gait training;Neuromuscular re-education;Stair training;Functional mobility training;Patient/family education;Therapeutic activities;Therapeutic exercise    PT Goals (Current goals can be found in the Care Plan section)  Acute Rehab PT Goals Patient Stated Goal: Return home PT Goal Formulation: With patient Time For Goal Achievement: 08/25/21 Potential to Achieve Goals: Good    Frequency Min 3X/week   Barriers to discharge        Co-evaluation               AM-PAC PT "6 Clicks" Mobility  Outcome Measure Help needed turning  from your back to your side while in a flat bed without using bedrails?: None Help needed moving from lying on your back to sitting on the side of a flat bed without using bedrails?: A Little Help needed moving to and from a bed to a chair (including a wheelchair)?: A Little Help needed standing up from a chair using your arms (e.g., wheelchair or bedside chair)?: A Little Help needed to walk in hospital room?: A Little Help needed climbing 3-5 steps with a railing? : A Lot 6 Click Score: 18    End of Session Equipment Utilized During Treatment: Gait belt Activity Tolerance: Patient tolerated treatment well Patient left: in chair;with call bell/phone within reach;with chair alarm set Nurse Communication: Mobility status PT Visit Diagnosis: Unsteadiness on feet (R26.81);Other abnormalities of gait and mobility (R26.89);Muscle weakness (generalized) (M62.81)    Time: 1638-4536 PT Time Calculation (min) (ACUTE ONLY): 13 min   Charges:   PT Evaluation $PT Eval Low Complexity: 1 Low          12:57 PM, 08/11/21 Mearl Latin PT, DPT Physical Therapist at [facility]

## 2021-08-12 ENCOUNTER — Inpatient Hospital Stay (HOSPITAL_COMMUNITY): Payer: Medicare (Managed Care)

## 2021-08-12 DIAGNOSIS — N184 Chronic kidney disease, stage 4 (severe): Secondary | ICD-10-CM

## 2021-08-12 DIAGNOSIS — R140 Abdominal distension (gaseous): Secondary | ICD-10-CM

## 2021-08-12 DIAGNOSIS — I4811 Longstanding persistent atrial fibrillation: Secondary | ICD-10-CM | POA: Diagnosis not present

## 2021-08-12 DIAGNOSIS — I5033 Acute on chronic diastolic (congestive) heart failure: Secondary | ICD-10-CM | POA: Diagnosis not present

## 2021-08-12 LAB — BASIC METABOLIC PANEL
Anion gap: 10 (ref 5–15)
BUN: 68 mg/dL — ABNORMAL HIGH (ref 8–23)
CO2: 24 mmol/L (ref 22–32)
Calcium: 8.7 mg/dL — ABNORMAL LOW (ref 8.9–10.3)
Chloride: 103 mmol/L (ref 98–111)
Creatinine, Ser: 2.51 mg/dL — ABNORMAL HIGH (ref 0.61–1.24)
GFR, Estimated: 24 mL/min — ABNORMAL LOW (ref >60)
Glucose, Bld: 83 mg/dL (ref 70–99)
Potassium: 4.5 mmol/L (ref 3.5–5.1)
Sodium: 137 mmol/L (ref 135–145)

## 2021-08-12 LAB — CBC
HCT: 29.3 % — ABNORMAL LOW (ref 39.0–52.0)
Hemoglobin: 9.1 g/dL — ABNORMAL LOW (ref 13.0–17.0)
MCH: 31.1 pg (ref 26.0–34.0)
MCHC: 31.1 g/dL (ref 30.0–36.0)
MCV: 100 fL (ref 80.0–100.0)
Platelets: 270 10*3/uL (ref 150–400)
RBC: 2.93 MIL/uL — ABNORMAL LOW (ref 4.22–5.81)
RDW: 17.7 % — ABNORMAL HIGH (ref 11.5–15.5)
WBC: 5 10*3/uL (ref 4.0–10.5)
nRBC: 0 % (ref 0.0–0.2)

## 2021-08-12 MED ORDER — FUROSEMIDE 10 MG/ML IJ SOLN
4.0000 mg/h | INTRAVENOUS | Status: DC
  Administered 2021-08-12: 17:00:00 8 mg/h via INTRAVENOUS
  Administered 2021-08-13 – 2021-08-15 (×2): 6 mg/h via INTRAVENOUS
  Filled 2021-08-12 (×4): qty 20

## 2021-08-12 NOTE — TOC Progression Note (Addendum)
Transition of Care [facility]) - Progression Note    Patient Details  Name: Lawrence Harper MRN: [id number] Date of Birth: Apr 25, 1932  Transition [facility]) CM/SW Contact  Salome Arnt, [place] Phone Number: 08/12/2021, 2:55 PM  Clinical Narrative:   TOC spoke with pt about home health and insurance. Pt reports his insurance is through [employer] from [place]. Finding in network home health agency will likely be difficult. Pt has no out of network home health benefits. [facility], [facility], [facility], [facility], and [facility] are unable to accept. Awaiting response from [facility]. 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 TOC will continue to follow.      Expected Discharge Plan: [place] Barriers to Discharge: Continued Medical Work up  Expected Discharge Plan and Services Expected Discharge Plan: [place] In-house Referral: Clinical Social Work Discharge Planning Services: CM Consult Post Acute Care Choice: [place] arrangements for the past 2 months: Single Family Home                                       Social Determinants of Health (SDOH) Interventions    Readmission Risk Interventions Readmission Risk Prevention Plan 08/11/2021  Transportation Screening Complete  Home Care Screening Complete  Medication Review (RN CM) Complete  Some recent data might be hidden

## 2021-08-12 NOTE — Progress Notes (Signed)
PROGRESS NOTE   Lawrence Harper  PXT:[id number] DOB: 1932-05-31 DOA: 08/10/2021 PCP: System, Provider Not In   Chief Complaint  Patient presents with   Shortness of Breath   Level of care: Telemetry  Brief Admission History:  87 y.o. male with medical history significant of paroxysmal A. fib, coronary artery disease, hypertension, obesity, chronic kidney disease, and diastolic heart failure.  Patient splits time between [facility] and [place].  Most of patient's primary care and specialist reside in [place], fortunately these records are available through epic for review.  Patient states that he has gained 40 to 50 pounds in the last 2 months.  Apparently patient had stopped taking his Lasix for a time period and was just resumed about a month ago his doctors in [place] have been encouraging him to come to the hospital for about 10 days.  Patient states that the swelling in his abdomen and his shortness of breath with exertion has continued to get worse prompting him to come to the ER.  He states his baseline weight is around 200 pounds.  Weight in the ER is 245 pounds.  Stay complicated with a pause of 4 seconds  Assessment & Plan:   Principal Problem:   Acute exacerbation of CHF (congestive heart failure) ([place]) Active Problems:   Hypertension   Atrial fibrillation (HCC)   CAD (coronary artery disease)   Chronic kidney disease   Goals of care, counseling/discussion   Acute respiratory failure ([place])   Sinus pause   Acute HFpEF / Right heart failure  -Patient responding favorably to IV Lasix infusion however remains very volume overloaded. -Cardiology team starting IV Lasix infusion 08/12/2021 -Continue to monitor intake output Daily weights and electrolytes closely. -Patient remains approximately 40 pounds over baseline weight of 193 pounds from 04/2021  Chronic atrial fibrillation -Lopressor 25 mg twice daily held due to pauses greater than 4 seconds -Cardiology team  monitoring telemetry -Heart rate has remained stable and controlled on no AV nodal blocking agent -Apixaban 2.5 mg twice daily for full anticoagulation  Nocturnal pauses -Appreciate cardiology consultation recommendations -Hold Lopressor at this time -Hold all AV nodal blocking agents  Coronary artery disease -Status post NSTEMI in September 2014 with DES to LAD. -He is on statin therapy daily  Moderate aortic stenosis -Noted on recent 2D echocardiogram continue outpatient follow-up with cardiology service  Stage IV CKD -Creatinine has improved slightly with diuresis continue to follow closely  DNR present on admission -It was discussed with daughter who is agreeable to BiPAP temporarily if required   DVT prophylaxis: Apixaban Code Status: DNR Family Communication: Daughter Disposition: Anticipate home Status is: Inpatient  Remains inpatient appropriate because: He continues to require IV diuresis with Lasix and now on a continuous infusion     Consultants:  Cardiology (inpatient) [facility]   Procedures:  N/a   Antimicrobials:  N/a   Subjective: Patient reporting that he is feeling better although he says he still does not feel back to baseline.  He says that he has been urinating most of the night and having a very good response to the IV Lasix.  Objective: Vitals:   08/11/21 1210 08/11/21 2155 08/12/21 0329 08/12/21 0500  BP: 133/65 129/80 111/82   Pulse: 73 (!) 52 72   Resp: 18 20 19    Temp: 97.8 F (36.6 C) 98.1 F (36.7 C) 98.2 F (36.8 C)   TempSrc: Oral Oral    SpO2: 100% 99% 100%   Weight:    109.3 kg  Height:        Intake/Output Summary (Last 24 hours) at 08/12/2021 1252 Last data filed at 08/12/2021 1121 Gross per 24 hour  Intake 1083 ml  Output 2850 ml  Net -1767 ml   Filed Weights   08/10/21 1500 08/11/21 0508 08/12/21 0500  Weight: 110.5 kg 110 kg 109.3 kg    Examination:  General exam: frail elderly male, sitting up in chair,  appears volume overloaded, Appears calm and comfortable  Respiratory system: bibasilar crackles.  Respiratory effort normal. Cardiovascular system: normal S1 & S2 heard. No JVD, murmurs, rubs, gallops or clicks. No pedal edema. Gastrointestinal system: Abdomen is distended, tympanitic, soft and nontender. No organomegaly or masses felt. Normal bowel sounds heard. Central nervous system: Alert and oriented. No focal neurological deficits. Extremities: 2+ edema BLEs. Pitting.  Skin: No rashes, lesions or ulcers Psychiatry: Judgement and insight appear normal. Mood & affect appropriate.   Data Reviewed: I have personally reviewed following labs and imaging studies  CBC: Recent Labs  Lab 08/10/21 1036 08/12/21 0605  WBC 6.2 5.0  NEUTROABS 4.8  --   HGB 9.7* 9.1*  HCT 31.4* 29.3*  MCV 102.3* 100.0  PLT 287 539    Basic Metabolic Panel: Recent Labs  Lab 08/10/21 1036 08/11/21 0618 08/12/21 0605  NA 136 138 137  K 4.7 4.9 4.5  CL 104 106 103  CO2 21* 24 24  GLUCOSE 91 91 83  BUN 70* 70* 68*  CREATININE 2.86* 2.77* 2.51*  CALCIUM 8.9 8.9 8.7*  MG 2.2  --   --     GFR: Estimated Creatinine Clearance: 24 mL/min (A) (by C-G formula based on SCr of 2.51 mg/dL (H)).  Liver Function Tests: Recent Labs  Lab 08/10/21 1036  AST 20  ALT 12  ALKPHOS 143*  BILITOT 0.7  PROT 8.1  ALBUMIN 3.0*    CBG: No results for input(s): GLUCAP in the last 168 hours.  Recent Results (from the past 240 hour(s))  Resp Panel by RT-PCR (Flu A&B, Covid) Nasopharyngeal Swab     Status: None   Collection Time: 08/10/21 11:45 AM   Specimen: Nasopharyngeal Swab; Nasopharyngeal(NP) swabs in vial transport medium  Result Value Ref Range Status   SARS Coronavirus 2 by RT PCR NEGATIVE NEGATIVE Final    Comment: (NOTE) SARS-CoV-2 target nucleic acids are NOT DETECTED.  The SARS-CoV-2 RNA is generally detectable in upper respiratory specimens during the acute phase of infection. The  lowest concentration of SARS-CoV-2 viral copies this assay can detect is 138 copies/mL. A negative result does not preclude SARS-Cov-2 infection and should not be used as the sole basis for treatment or other patient management decisions. A negative result may occur with  improper specimen collection/handling, submission of specimen other than nasopharyngeal swab, presence of viral mutation(s) within the areas targeted by this assay, and inadequate number of viral copies(<138 copies/mL). A negative result must be combined with clinical observations, patient history, and epidemiological information. The expected result is Negative.  Fact Sheet for Patients:  EntrepreneurPulse.com.au  Fact Sheet for Healthcare Providers:  IncredibleEmployment.be  This test is no t yet approved or cleared by the [place] FDA and  has been authorized for detection and/or diagnosis of SARS-CoV-2 by FDA under an Emergency Use Authorization (EUA). This EUA will remain  in effect (meaning this test can be used) for the duration of the COVID-19 declaration under Section 564(b)(1) of the Act, 21 U.S.C.section 360bbb-3(b)(1), unless the authorization is terminated  or revoked sooner.  Influenza A by PCR NEGATIVE NEGATIVE Final   Influenza B by PCR NEGATIVE NEGATIVE Final    Comment: (NOTE) The Xpert Xpress SARS-CoV-2/FLU/RSV plus assay is intended as an aid in the diagnosis of influenza from Nasopharyngeal swab specimens and should not be used as a sole basis for treatment. Nasal washings and aspirates are unacceptable for Xpert Xpress SARS-CoV-2/FLU/RSV testing.  Fact Sheet for Patients: EntrepreneurPulse.com.au  Fact Sheet for Healthcare Providers: IncredibleEmployment.be  This test is not yet approved or cleared by the [place] FDA and has been authorized for detection and/or diagnosis of SARS-CoV-2 by FDA under  an Emergency Use Authorization (EUA). This EUA will remain in effect (meaning this test can be used) for the duration of the COVID-19 declaration under Section 564(b)(1) of the Act, 21 U.S.C. section 360bbb-3(b)(1), unless the authorization is terminated or revoked.  Performed at [facility], [address] [place],  [address]      Radiology Studies: ECHOCARDIOGRAM COMPLETE  Result Date: 08/11/2021    ECHOCARDIOGRAM REPORT   Patient Name:   Lawrence Harper Date of Exam: 08/11/2021 Medical Rec #:  [id number]      Height:       66.0 in Accession #:    [id number]     Weight:       242.5 lb Date of Birth:  05/20/1932     BSA:          2.170 m Patient Age:    51 years       BP:           119/62 mmHg Patient Gender: M              HR:           71 bpm. Exam Location:  [facility] Procedure: 2D Echo, Cardiac Doppler, Color Doppler and Intracardiac            Opacification Agent Indications:    CHF  History:        Patient has no prior history of Echocardiogram examinations.                 CHF, Previous Myocardial Infarction and CAD, Arrythmias:Atrial                 Fibrillation; Risk Factors:Hypertension and Diabetes.  Sonographer:    Wenda Low Referring Phys: [place] Comments: Patient is morbidly obese. Image acquisition challenging due to patient body habitus. IMPRESSIONS  1. Left ventricular ejection fraction, by estimation, is 65 to 70%. The left ventricle has normal function. The left ventricle has no regional wall motion abnormalities. There is mild left ventricular hypertrophy. Left ventricular diastolic parameters are indeterminate.  2. Right ventricular systolic function is severely reduced. The right ventricular size is moderately enlarged. There is mildly elevated pulmonary artery systolic pressure.  3. Left atrial size was mildly dilated.  4. Right atrial size was severely dilated.  5. Trivial mitral valve regurgitation.  6. Tricuspid valve regurgitation is  severe.  7. AV is thickened, calcified. Peak and mean gradients throught the valve are 33 and 17 mm Hg respectviely AVA (VTI) is 0.84 cm 2. Dimensionless index is 0.31 consistent with moderate AS. Marland Kitchen The aortic valve is tricuspid. Aortic valve regurgitation is not visualized.  8. The inferior vena cava is dilated in size with <50% respiratory variability, suggesting right atrial pressure of 15 mmHg. FINDINGS  Left Ventricle: Left ventricular ejection fraction, by estimation, is 65 to 70%. The left  ventricle has normal function. The left ventricle has no regional wall motion abnormalities. Definity contrast agent was given IV to delineate the left ventricular  endocardial borders. The left ventricular internal cavity size was normal in size. There is mild left ventricular hypertrophy. Left ventricular diastolic parameters are indeterminate. Right Ventricle: The right ventricular size is moderately enlarged. Right vetricular wall thickness was not assessed. Right ventricular systolic function is severely reduced. There is mildly elevated pulmonary artery systolic pressure. The tricuspid regurgitant velocity is 2.61 m/s, and with an assumed right atrial pressure of 15 mmHg, the estimated right ventricular systolic pressure is 16.1 mmHg. Left Atrium: Left atrial size was mildly dilated. Right Atrium: Right atrial size was severely dilated. Pericardium: There is no evidence of pericardial effusion. Mitral Valve: There is mild thickening of the mitral valve leaflet(s). Mild mitral annular calcification. Trivial mitral valve regurgitation. MV peak gradient, 7.8 mmHg. The mean mitral valve gradient is 2.0 mmHg. Tricuspid Valve: The tricuspid valve is normal in structure. Tricuspid valve regurgitation is severe. Aortic Valve: AV is thickened, calcified. Peak and mean gradients throught the valve are 33 and 17 mm Hg respectviely AVA (VTI) is 0.84 cm 2. Dimensionless index is 0.31 consistent with moderate AS. The aortic valve  is tricuspid. Aortic valve regurgitation is not visualized. Aortic valve mean gradient measures 15.5 mmHg. Aortic valve peak gradient measures 31.8 mmHg. Aortic valve area, by VTI measures 0.89 cm. Pulmonic Valve: The pulmonic valve was grossly normal. Pulmonic valve regurgitation is mild. Aorta: The aortic root is normal in size and structure. Venous: The inferior vena cava is dilated in size with less than 50% respiratory variability, suggesting right atrial pressure of 15 mmHg. IAS/Shunts: No atrial level shunt detected by color flow Doppler.  LEFT VENTRICLE PLAX 2D LVIDd:         4.50 cm   Diastology LVIDs:         2.60 cm   LV e' medial:    9.36 cm/s LV PW:         1.30 cm   LV E/e' medial:  13.1 LV IVS:        1.00 cm   LV e' lateral:   10.90 cm/s LVOT diam:     1.90 cm   LV E/e' lateral: 11.3 LV SV:         66 LV SV Index:   30 LVOT Area:     2.84 cm  RIGHT VENTRICLE RV Basal diam:  5.50 cm RV S prime:     10.80 cm/s TAPSE (M-mode): 2.1 cm LEFT ATRIUM             Index        RIGHT ATRIUM           Index LA diam:        5.60 cm 2.58 cm/m   RA Area:     40.00 cm LA Vol (A2C):   81.4 ml 37.50 ml/m  RA Volume:   166.00 ml 76.48 ml/m LA Vol (A4C):   77.3 ml 35.61 ml/m LA Biplane Vol: 81.6 ml 37.60 ml/m  AORTIC VALVE                     PULMONIC VALVE AV Area (Vmax):    0.90 cm      PV Vmax:       0.88 m/s AV Area (Vmean):   0.95 cm      PV Peak grad:  3.1 mmHg AV Area (VTI):  0.89 cm AV Vmax:           282.00 cm/s AV Vmean:          183.000 cm/s AV VTI:            0.740 m AV Peak Grad:      31.8 mmHg AV Mean Grad:      15.5 mmHg LVOT Vmax:         89.40 cm/s LVOT Vmean:        61.300 cm/s LVOT VTI:          0.233 m LVOT/AV VTI ratio: 0.31  AORTA Ao Root diam: 2.70 cm MITRAL VALVE                TRICUSPID VALVE MV Area (PHT): 2.93 cm     TR Peak grad:   27.2 mmHg MV Area VTI:   2.00 cm     TR Vmax:        261.00 cm/s MV Peak grad:  7.8 mmHg MV Mean grad:  2.0 mmHg     SHUNTS MV Vmax:       1.40 m/s      Systemic VTI:  0.23 m MV Vmean:      48.1 cm/s    Systemic Diam: 1.90 cm MV Decel Time: 259 msec MV E velocity: 123.00 cm/s Dorris Carnes MD Electronically signed by Dorris Carnes MD Signature Date/Time: 08/11/2021/5:32:59 PM    Final     Scheduled Meds:  apixaban  2.5 mg Oral BID   atorvastatin  40 mg Oral q1800   calcium-vitamin D  1 tablet Oral Daily   ferrous sulfate  325 mg Oral Q breakfast   folic acid  1 mg Oral Daily   nystatin   Topical TID   sodium chloride flush  3 mL Intravenous Q12H   timolol  1 drop Both Eyes BID   Continuous Infusions:  sodium chloride     furosemide (LASIX) 200 mg in dextrose 5% 100 mL (2mg /mL) infusion      LOS: 2 days   Time spent: 40 mins   Jorrell Kuster Wynetta Emery, MD How to contact the [facility] Attending or Consulting provider [place] or covering provider during after hours 7P -7A, for this patient?  Check the care team in [facility] and look for a) attending/consulting [facility] provider listed and b) the [facility] team listed Log into [email or web address] and use [place]'s universal password to access. If you do not have the password, please contact the hospital operator. Locate the [facility] provider you are looking for under [facility] and page to a number that you can be directly reached. If you still have difficulty reaching the provider, please page the [facility] (Director on Call) for the Hospitalists listed on amion for assistance.  08/12/2021, 12:52 PM

## 2021-08-12 NOTE — Progress Notes (Signed)
Late Note; checked in on patient at this time, patient was awake. RT helped patient with removing dentures and putting socks on his feet because he complained they were cold, lights turned off and patient attempting to go to sleep.

## 2021-08-12 NOTE — Progress Notes (Addendum)
Progress Note  Patient Name: Lawrence Harper Date of Encounter: 08/12/2021  [facility] Cardiologist: Dr. Cornelious Bryant with [facility]) in [place], [place] improved. No chest pain or palpitations. Still with significant abdominal distension.   Inpatient Medications    Scheduled Meds:  apixaban  2.5 mg Oral BID   atorvastatin  40 mg Oral q1800   calcium-vitamin D  1 tablet Oral Daily   ferrous sulfate  325 mg Oral Q breakfast   folic acid  1 mg Oral Daily   furosemide  80 mg Intravenous Q12H   nystatin   Topical TID   sodium chloride flush  3 mL Intravenous Q12H   timolol  1 drop Both Eyes BID   Continuous Infusions:  sodium chloride     PRN Meds: sodium chloride, acetaminophen, nitroGLYCERIN, ondansetron (ZOFRAN) IV, sodium chloride flush   Vital Signs    Vitals:   08/11/21 1210 08/11/21 2155 08/12/21 0329 08/12/21 0500  BP: 133/65 129/80 111/82   Pulse: 73 (!) 52 72   Resp: 18 20 19    Temp: 97.8 F (36.6 C) 98.1 F (36.7 C) 98.2 F (36.8 C)   TempSrc: Oral Oral    SpO2: 100% 99% 100%   Weight:    109.3 kg  Height:        Intake/Output Summary (Last 24 hours) at 08/12/2021 1044 Last data filed at 08/12/2021 0900 Gross per 24 hour  Intake 1083 ml  Output 3550 ml  Net -2467 ml   Last 3 Weights 08/12/2021 08/11/2021 08/10/2021  Weight (lbs) 240 lb 15.4 oz 242 lb 8.1 oz 243 lb 9.7 oz  Weight (kg) 109.3 kg 110 kg 110.5 kg      Telemetry    Atrial fibrillation, HR in 70's. No significant pauses (longest by review of tele has been 1.5 seconds overnight and this AM) - Personally Reviewed  ECG    No new tracings.   Physical Exam   GEN: Pleasant elderly male appearing in no acute distress.   Neck: JVD to jaw.   Cardiac: Irregularly irregular. 2/6 SEM along RUSB. Respiratory: Clear to auscultation bilaterally. GI: Appears distended.  MS: 1+ pitting edema bilaterally; No deformity. Neuro:  Nonfocal   Psych: Normal affect   Labs    High Sensitivity Troponin:   Recent Labs  Lab 08/10/21 1036 08/10/21 1220  TROPONINIHS 10 9     Chemistry Recent Labs  Lab 08/10/21 1036 08/11/21 0618 08/12/21 0605  NA 136 138 137  K 4.7 4.9 4.5  CL 104 106 103  CO2 21* 24 24  GLUCOSE 91 91 83  BUN 70* 70* 68*  CREATININE 2.86* 2.77* 2.51*  CALCIUM 8.9 8.9 8.7*  MG 2.2  --   --   PROT 8.1  --   --   ALBUMIN 3.0*  --   --   AST 20  --   --   ALT 12  --   --   ALKPHOS 143*  --   --   BILITOT 0.7  --   --   GFRNONAA 21* 21* 24*  ANIONGAP 11 8 10     Lipids No results for input(s): CHOL, TRIG, HDL, LABVLDL, LDLCALC, CHOLHDL in the last 168 hours.  Hematology Recent Labs  Lab 08/10/21 1036 08/12/21 0605  WBC 6.2 5.0  RBC 3.07* 2.93*  HGB 9.7* 9.1*  HCT 31.4* 29.3*  MCV 102.3* 100.0  MCH 31.6 31.1  MCHC 30.9 31.1  RDW 18.3* 17.7*  PLT 287 270   Thyroid No results for input(s): TSH, FREET4 in the last 168 hours.  BNP Recent Labs  Lab 08/10/21 1036  BNP 522.0*    DDimer No results for input(s): DDIMER in the last 168 hours.   Radiology    DG Chest 2 View  Result Date: 08/10/2021 CLINICAL DATA:  86 year old male with a history of shortness of breath EXAM: CHEST - 2 VIEW COMPARISON:  None. FINDINGS: Cardiomediastinal silhouette enlarged with cardiomegaly. Calcifications of the aortic arch. Interlobular septal thickening. Low lung volumes. Opacity at the left lung base with obscuration of the left hemidiaphragm and the left heart border. Opacity at the posterior lung base on the lateral view. No pneumothorax.  No right-sided pleural effusion. Degenerative changes of the spine. IMPRESSION: Low lung volumes, with evidence acute CHF and left-sided pleural effusion. Cardiomegaly and aortic atherosclerosis. Electronically Signed   By: Corrie Mckusick D.O.   On: 08/10/2021 11:26    Cardiac Studies   Echocardiogram: 08/11/2021 IMPRESSIONS     1. Left ventricular ejection fraction,  by estimation, is 65 to 70%. The  left ventricle has normal function. The left ventricle has no regional  wall motion abnormalities. There is mild left ventricular hypertrophy.  Left ventricular diastolic parameters  are indeterminate.   2. Right ventricular systolic function is severely reduced. The right  ventricular size is moderately enlarged. There is mildly elevated  pulmonary artery systolic pressure.   3. Left atrial size was mildly dilated.   4. Right atrial size was severely dilated.   5. Trivial mitral valve regurgitation.   6. Tricuspid valve regurgitation is severe.   7. AV is thickened, calcified. Peak and mean gradients throught the valve  are 33 and 17 mm Hg respectviely AVA (VTI) is 0.84 cm 2. Dimensionless  index is 0.31 consistent with moderate AS. Marland Kitchen The aortic valve is  tricuspid. Aortic valve regurgitation is  not visualized.   8. The inferior vena cava is dilated in size with <50% respiratory  variability, suggesting right atrial pressure of 15 mmHg.   Patient Profile     87 y.o. male w/ PMH of CAD (s/p NSTEMI in 9/14 with DES to LAD), permanent atrial fibrillation, HFpEF, ASD, HTN, HLD, Type 2 DM, CKD (Creatinine in Care Everywhere since 04/2020 ranges 1.6-2.8; last SCr 2.1 in 9/22), GERD, peripheral arterial disease, glaucoma and obesity who is currently admitted for an acute CHF exacerbation. Cardiology consulted due to nocturnal pauses > 4 seconds.   Assessment & Plan    1. Acute HFpEF/RV Dysfunction - Presented with worsening dyspnea on exertion and orthopnea in the setting of a 40-50 lb weight gain over the past few months with Lasix previously being stopped by Nephrology in [place] due to worsening renal function.  - BNP elevated to 522 on admission and CXR consistent with CHF. Alk Phos mildly elevated on admission. Will recheck LFT's tomorrow and agree with Abdominal US as previously discussed by the Hospitalist. Echo shows a normal EF of 65-70% but RV function is  severely reduced with mildly elevated PASP.  - He has been receiving IV Lasix 32m BID with a recorded net output of -3.9 L thus far and weight down 5 lbs (245 --> 240 lbs). [place] at 193 [place] is still significantly volume overloaded on examination and weight is 40 lbs above baseline. Will review with Dr. RHarrington Challengerin regards to possibly adding  Metolazone to his regimen or switching to a Lasix drip.   2. Permanent Atrial Fibrillation - Previously on Lopressor 63m BID but held now given pauses > 4 seconds. No recurrent significant pauses and HR has been well-controlled without AV nodal blocking agents.  - He is on Eliquis 2.525mBID which is the appropriate dose at this time given his age of [age] creatinine of 2.51.  3. Nocturnal Pause - No recurrence since stopping BB therapy. Continue to follow on telemetry. Would benefit from a sleep study as an outpatient given his RV dysfunction and nocturnal pauses. No reported history of snoring or prior sleep study. Will need to determine if the patient is planning [facility] remain [facility]  4. CAD - He is s/p NSTEMI in 9/14 with DES to LAD. He denies any recent anginal symptoms and was working on his farm prior to admission. Hs Troponin values negative at 9 and 10.  - Remains on statin therapy. No ASA given the need for anticoagulation and BB discontinued due to bradycardia.   5. Aortic Stenosis - Moderate by echocardiogram this admission. Will need to continue to follow as an outpatient.   6. Stage 4 CKD - Creatinine was at 2.1 in 04/2021. Elevated to 2.86 on admission, improving with diuresis and at 2.51 today.    For questions or updates, please contact [facility] consult [email or web address] for contact info under        Signed, BrErma HeritagePA-C  08/12/2021, 10:44 AM    Patient seen and examined  I agree with findngs as noted by B Strader above    Pt still with marked  volume increase  on exam    Neck  JVP is pulsating (in setting of severe TR) Lungs with rales at bases Back  Pitting edema Cardiac exam    Irreg irreg   Gr II/VI systolic murmur LSB to apex Abd is tense Ext with 1+ edema  Echo with severe RV dysfunction  Severe TR    LVEF normal   Mod AS Etiology of  RV dysfunction    Pt has been on long term anticoagulation   Goes against chronic PE    Would recomm sleep  eval as outpatient    TR is probably secondary to RV dilitation AS is moderate  Would continue diuresis   Can try IV lasix gtt to see if response is better  Watch renal function  PaDorris CarnesD

## 2021-08-13 DIAGNOSIS — I509 Heart failure, unspecified: Secondary | ICD-10-CM | POA: Diagnosis not present

## 2021-08-13 DIAGNOSIS — I5033 Acute on chronic diastolic (congestive) heart failure: Secondary | ICD-10-CM | POA: Diagnosis not present

## 2021-08-13 DIAGNOSIS — R140 Abdominal distension (gaseous): Secondary | ICD-10-CM | POA: Diagnosis not present

## 2021-08-13 DIAGNOSIS — J9601 Acute respiratory failure with hypoxia: Secondary | ICD-10-CM | POA: Diagnosis not present

## 2021-08-13 LAB — COMPREHENSIVE METABOLIC PANEL
ALT: 11 U/L (ref 0–44)
AST: 19 U/L (ref 15–41)
Albumin: 2.5 g/dL — ABNORMAL LOW (ref 3.5–5.0)
Alkaline Phosphatase: 126 U/L (ref 38–126)
Anion gap: 9 (ref 5–15)
BUN: 72 mg/dL — ABNORMAL HIGH (ref 8–23)
CO2: 25 mmol/L (ref 22–32)
Calcium: 8.7 mg/dL — ABNORMAL LOW (ref 8.9–10.3)
Chloride: 101 mmol/L (ref 98–111)
Creatinine, Ser: 2.73 mg/dL — ABNORMAL HIGH (ref 0.61–1.24)
GFR, Estimated: 22 mL/min — ABNORMAL LOW (ref >60)
Glucose, Bld: 89 mg/dL (ref 70–99)
Potassium: 4.5 mmol/L (ref 3.5–5.1)
Sodium: 135 mmol/L (ref 135–145)
Total Bilirubin: 0.7 mg/dL (ref 0.3–1.2)
Total Protein: 6.9 g/dL (ref 6.5–8.1)

## 2021-08-13 LAB — CBC
HCT: 28.3 % — ABNORMAL LOW (ref 39.0–52.0)
Hemoglobin: 8.6 g/dL — ABNORMAL LOW (ref 13.0–17.0)
MCH: 30.3 pg (ref 26.0–34.0)
MCHC: 30.4 g/dL (ref 30.0–36.0)
MCV: 99.6 fL (ref 80.0–100.0)
Platelets: 258 10*3/uL (ref 150–400)
RBC: 2.84 MIL/uL — ABNORMAL LOW (ref 4.22–5.81)
RDW: 17.6 % — ABNORMAL HIGH (ref 11.5–15.5)
WBC: 5.1 10*3/uL (ref 4.0–10.5)
nRBC: 0 % (ref 0.0–0.2)

## 2021-08-13 LAB — MAGNESIUM: Magnesium: 2.1 mg/dL (ref 1.7–2.4)

## 2021-08-13 NOTE — Progress Notes (Signed)
Physical Therapy Treatment Patient Details Name: Lawrence Harper MRN: [id number] DOB: 1932-03-23 Today's Date: 08/13/2021   History of Present Illness Lawrence Harper is a 87 y.o. male with medical history significant of paroxysmal A. fib, coronary artery disease, hypertension, obesity, chronic kidney disease, and diastolic heart failure.  Patient splits time between [facility] and [place].  Most of patient's primary care and specialist reside in [place], fortunately these records are available through epic for review.  Patient states that he has gained 40 to 50 pounds in the last 2 months.  Apparently patient had stopped taking his Lasix for a time period and was just resumed about a month ago his doctors in [place] have been encouraging him to come to the hospital for about 10 days.  Patient states that the swelling in his abdomen and his shortness of breath with exertion has continued to get worse prompting him to come to the ER.  He states his baseline weight is around 200 pounds.  Weight in the ER is 245 pounds.    PT Comments    Patient in room with RN giving meds and eating breakfast, agreeable to PT. Reports he has been walking some in the room with RW and assist since PT eval. Demo good sit to stand with MinA in initial transfer to begin gait. Able to static stance with 1 UE on RW to don mask. Good ambulation distance with no SOB or verbal reports of fatigue. Patient transferred back to EOB to finish breakfast, and completed 1 aditional sit to stand to doff extra gown without use of RW and pushing up from bed. RN notified of ambulation and mobility status. Patient will benefit from continued skilled physical therapy in hospital and recommended venue below to increase strength, balance, endurance for safe ADLs and gait. '   Recommendations for follow up therapy are one component of a multi-disciplinary discharge planning process, led by the attending physician.  Recommendations may be  updated based on patient status, additional functional criteria and insurance authorization.  Follow Up Recommendations  Home health PT     Assistance Recommended at Discharge Intermittent Supervision/Assistance  Equipment Recommendations  None recommended by PT    Recommendations for Other Services       Precautions / Restrictions Precautions Precautions: Fall Restrictions Weight Bearing Restrictions: No     Mobility  Bed Mobility               General bed mobility comments: seated on EOB eating breakfast at start of session Patient Response: Cooperative  Transfers Overall transfer level: Needs assistance Equipment used: Rolling walker (2 wheels) Transfers: Sit to/from Stand Sit to Stand: Min assist;Min guard           General transfer comment: labored, slow. with RW, initial required MinA, second required [employer]    Ambulation/Gait Ambulation/Gait assistance: Min guard Gait Distance (Feet): 60 Feet Assistive device: Rolling walker (2 wheels) Gait Pattern/deviations: Step-through pattern;Trunk flexed;Decreased stride length Gait velocity: decreased     General Gait Details: slow cadence with RW, no loss of balance, on RA without SOB   Stairs             Wheelchair Mobility    Modified Rankin (Stroke Patients Only)       Balance Overall balance assessment: Needs assistance Sitting-balance support: Feet supported;No upper extremity supported Sitting balance-Leahy Scale: Good Sitting balance - Comments: seated Edge of bed eating breakfast   Standing balance support: Bilateral upper extremity supported;Reliant on assistive  device for balance;During functional activity Standing balance-Leahy Scale: Good Standing balance comment: with RW, x1 1 UE removed to don mask in stance, required RW for gait                            Cognition Arousal/Alertness: Awake/alert Behavior During Therapy: WFL for tasks  assessed/performed Overall Cognitive Status: Within Functional Limits for tasks assessed                                          Exercises      General Comments        Pertinent Vitals/Pain Pain Assessment: No/denies pain    Home Living                          Prior Function            PT Goals (current goals can now be found in the care plan section) Acute Rehab PT Goals Patient Stated Goal: Return home PT Goal Formulation: With patient Time For Goal Achievement: 08/25/21 Potential to Achieve Goals: Good Progress towards PT goals: Progressing toward goals    Frequency    Min 3X/week      PT Plan Current plan remains appropriate    Co-evaluation              AM-PAC PT "6 Clicks" Mobility   Outcome Measure  Help needed turning from your back to your side while in a flat bed without using bedrails?: None Help needed moving from lying on your back to sitting on the side of a flat bed without using bedrails?: A Little Help needed moving to and from a bed to a chair (including a wheelchair)?: A Little Help needed standing up from a chair using your arms (e.g., wheelchair or bedside chair)?: A Little Help needed to walk in hospital room?: A Little Help needed climbing 3-5 steps with a railing? : A Lot 6 Click Score: 18    End of Session   Activity Tolerance: Patient tolerated treatment well Patient left: with call bell/phone within reach;in bed;with bed alarm set Nurse Communication: Mobility status PT Visit Diagnosis: Unsteadiness on feet (R26.81);Other abnormalities of gait and mobility (R26.89);Muscle weakness (generalized) (M62.81)     Time: 4196-2229 PT Time Calculation (min) (ACUTE ONLY): 10 min  Charges:  $Therapeutic Exercise: 8-22 mins                     9:03 AM,08/13/21 Domenic Moras, PT, DPT Physical Therapist at [facility]

## 2021-08-13 NOTE — Progress Notes (Addendum)
Progress Note  Patient Name: Lawrence Harper Date of Encounter: 08/13/2021  [facility] Cardiologist: Dr. Cornelious Bryant with [facility]) in [place], [place] continues to improve. No orthopnea or PND overnight. No chest pain or palpitations overnight or this AM. Abdominal distension still present but has improved.   Inpatient Medications    Scheduled Meds:  apixaban  2.5 mg Oral BID   atorvastatin  40 mg Oral q1800   calcium-vitamin D  1 tablet Oral Daily   ferrous sulfate  325 mg Oral Q breakfast   folic acid  1 mg Oral Daily   nystatin   Topical TID   sodium chloride flush  3 mL Intravenous Q12H   timolol  1 drop Both Eyes BID   Continuous Infusions:  sodium chloride     furosemide (LASIX) 200 mg in dextrose 5% 100 mL (2mg /mL) infusion 8 mg/hr (08/12/21 1716)   PRN Meds: sodium chloride, acetaminophen, nitroGLYCERIN, ondansetron (ZOFRAN) IV, sodium chloride flush   Vital Signs    Vitals:   08/12/21 1329 08/12/21 2208 08/13/21 0431 08/13/21 0500  BP: (!) 107/43 111/69 (!) 109/53   Pulse: 69 60 (!) 58   Resp: 15 18 19    Temp: 97.6 F (36.4 C) 98.1 F (36.7 C) 98.6 F (37 C)   TempSrc: Oral  Oral   SpO2: 99% 99% 96%   Weight:    105.9 kg  Height:        Intake/Output Summary (Last 24 hours) at 08/13/2021 0821 Last data filed at 08/13/2021 0500 Gross per 24 hour  Intake 1129.63 ml  Output 3900 ml  Net -2770.37 ml    Net 6 L since admit  Last 3 Weights 08/13/2021 08/12/2021 08/11/2021  Weight (lbs) 233 lb 7.5 oz 240 lb 15.4 oz 242 lb 8.1 oz  Weight (kg) 105.9 kg 109.3 kg 110 kg      Telemetry    Atrial fibrillation, HR in 50's to 60's. 3 beats NSVT and occasional PVC's.  - Personally Reviewed  ECG    No new tracings.   Physical Exam   GEN: Pleasant male appearing in no acute distress.   Neck: JVD still to jaw.  Cardiac: Irregularly irregular. 2/6 SEM along RUSB. No rubs or gallops.  Respiratory:  Mild rales along bases bilaterally.  GI: Significant distension.  MS: Trace lower extremity edema bilaterally; No deformity. Neuro:  Nonfocal  Psych: Normal affect   Labs    High Sensitivity Troponin:   Recent Labs  Lab 08/10/21 1036 08/10/21 1220  TROPONINIHS 10 9     Chemistry Recent Labs  Lab 08/10/21 1036 08/11/21 0618 08/12/21 0605 08/13/21 0554  NA 136 138 137 135  K 4.7 4.9 4.5 4.5  CL 104 106 103 101  CO2 21* 24 24 25   GLUCOSE 91 91 83 89  BUN 70* 70* 68* 72*  CREATININE 2.86* 2.77* 2.51* 2.73*  CALCIUM 8.9 8.9 8.7* 8.7*  MG 2.2  --   --  2.1  PROT 8.1  --   --  6.9  ALBUMIN 3.0*  --   --  2.5*  AST 20  --   --  19  ALT 12  --   --  11  ALKPHOS 143*  --   --  126  BILITOT 0.7  --   --  0.7  GFRNONAA 21* 21* 24* 22*  ANIONGAP 11 8 10  9  Lipids No results for input(s): CHOL, TRIG, HDL, LABVLDL, LDLCALC, CHOLHDL in the last 168 hours.  Hematology Recent Labs  Lab 08/10/21 1036 08/12/21 0605 08/13/21 0554  WBC 6.2 5.0 5.1  RBC 3.07* 2.93* 2.84*  HGB 9.7* 9.1* 8.6*  HCT 31.4* 29.3* 28.3*  MCV 102.3* 100.0 99.6  MCH 31.6 31.1 30.3  MCHC 30.9 31.1 30.4  RDW 18.3* 17.7* 17.6*  PLT 287 270 258   Thyroid No results for input(s): TSH, FREET4 in the last 168 hours.  BNP Recent Labs  Lab 08/10/21 1036  BNP 522.0*    DDimer No results for input(s): DDIMER in the last 168 hours.   Radiology    US Abdomen Complete  Result Date: 08/12/2021 CLINICAL DATA:  Abdominal distension.  Cholecystectomy. EXAM: ABDOMEN ULTRASOUND COMPLETE COMPARISON:  None. FINDINGS: Gallbladder: Surgically absent. Common bile duct: Diameter: 6 mm Liver: No focal lesion identified. Increase in parenchymal echogenicity. Portal vein is patent on color Doppler imaging with normal direction of blood flow towards the liver. IVC: No abnormality visualized. Pancreas: Not well seen secondary to overlying bowel gas. Spleen: Size and appearance within normal limits. Right Kidney: Length:  13.5. Echogenicity is increased. There is a 12.6 x 8.4 x 8.0 cm cyst in the superior pole the right kidney. There is a 4.0 by 3.5 x 3.3 cm cyst in the mid right kidney. No hydronephrosis. Left Kidney: Length: 14.1. Echogenicity is increased. No hydronephrosis. There is a mid renal cysts measuring 3.5 x 2.9 x 2.5 cm. There is a lower pole cyst measuring 2.3 x 2.4 x 2.8 cm. Abdominal aorta: No aneurysm visualized. Distal portion and bifurcation not well seen. Measured portion of the aorta 2.7 cm. Other findings: Small volume ascites. IMPRESSION: 1. Small volume ascites. 2. Bilateral echogenic kidneys worrisome for medical renal disease. 3. Bilateral renal cysts. Largest cyst is in the superior pole the right kidney measuring 12.6 cm. 4. Pancreas not well seen. 5. Cholecystectomy. 6. Echogenic liver suggestive of hepatocellular disease such as fatty infiltration. Electronically Signed   By: Ronney Asters M.D.   On: 08/12/2021 15:10     Cardiac Studies   Echocardiogram: 08/11/2021 IMPRESSIONS     1. Left ventricular ejection fraction, by estimation, is 65 to 70%. The  left ventricle has normal function. The left ventricle has no regional  wall motion abnormalities. There is mild left ventricular hypertrophy.  Left ventricular diastolic parameters  are indeterminate.   2. Right ventricular systolic function is severely reduced. The right  ventricular size is moderately enlarged. There is mildly elevated  pulmonary artery systolic pressure.   3. Left atrial size was mildly dilated.   4. Right atrial size was severely dilated.   5. Trivial mitral valve regurgitation.   6. Tricuspid valve regurgitation is severe.   7. AV is thickened, calcified. Peak and mean gradients throught the valve  are 33 and 17 mm Hg respectviely AVA (VTI) is 0.84 cm 2. Dimensionless  index is 0.31 consistent with moderate AS. Marland Kitchen The aortic valve is  tricuspid. Aortic valve regurgitation is  not visualized.   8. The  inferior vena cava is dilated in size with <50% respiratory  variability, suggesting right atrial pressure of 15 mmHg.   Patient Profile     87 y.o. male male w/ PMH of CAD (s/p NSTEMI in 9/14 with DES to LAD), permanent atrial fibrillation, HFpEF, ASD, HTN, HLD, Type 2 DM, CKD (Creatinine in [place] since 04/2020 ranges 1.6-2.8; last SCr 2.1 in 9/22), GERD,  peripheral arterial disease, glaucoma and obesity who is currently admitted for an acute CHF exacerbation. Cardiology consulted due to nocturnal pauses > 4 seconds.   Assessment & Plan    1. Acute HFpEF/RV Dysfunction - Presented with worsening dyspnea on exertion and orthopnea in the setting of a 40+ lb weight gain over the past few months with Lasix previously being stopped by Nephrology in [place] due to worsening renal function but he had resumed taking 80mg  daily prior to admission.  - Repeat echo shows a preserved EF of 65-70% but noted to have severely reduced RV function and severe TR. Abdominal US shows small volume ascites and also suggestive of fatty liver disease.  - Was on IV Lasix 80mg  BID but given his significant volume overload, was switched to a Lasix drip yesterday at 8mg /hr with great response (output of -3.9 L and net output of -2.7 L yesterday with net output of -5.7 L this admission and another 600 cc currently in his urine canister. Weight down 12 lbs since admission and now at 233 lbs but still 30 lbs above baseline. Given the rise in his creatinine, will reduce his Lasix drip to 6mg /hr. Pending repeat BMET tomorrow, may need to reduce to 4 mg/hr. Ultimately, I suspect he would benefit from being switched to Torsemide at discharge for improved bioavailability.    2. Permanent Atrial Fibrillation - Previously on Lopressor 25mg  BID and rates have been well-controlled since stopping is BB this admission. Continue to avoid AV nodal blocking agents for now given his prior pauses.  - Continue Eliquis 2.5mg  BID (reduced dose  given his age and renal function).   3. Nocturnal Pause - Was on BB therapy prior to admission which has since been discontinued and no recurrence by review of telemetry.  - Would benefit from an outpatient sleep study given his nocturnal pauses and also due to his RV dysfunction.   4. CAD - S/p NSTEMI in 9/14 with DES to LAD. 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  - Hs Troponin negative this admission and echo shows a preserved EF. Continue statin therapy. BB discontinued this admission as outlined above and not on ASA given he is on Eliquis.   5. Aortic Stenosis - Mild by echo in 2017 and moderate by echo this admission. Continue to follow on an outpatient basis.   6. Stage 4 CKD - Abdominal US this admission shows echogenic kidneys consistent with medical renal disease and also noted to have bilateral renal cysts. Creatinine was at 2.1 in 04/2021. Elevated to 2.86 on admission, down to 2.51 on 12/21 but trending up to 2.7 today. Will adjust Lasix drip as outlined above.   7. Anemia - Suspect secondary to chronic disease. Hgb at 8.6 this AM and he denies any evidence of active bleeding. Further management per the admitting team.    For questions or updates, please contact [place] Please consult [email or web address] for contact info under        Signed, Erma Heritage, PA-C  08/13/2021, 8:21 AM    Patient seen and examined   I agree with findings as noted above by B Strader Pt continues to diurese  though Cr did take a sl bump today     Still with increased volume on exam  Neck   JVP is increased Lungs without rales Cardiac exam:   irreg irreg  No S3  III/VI systolic murmur LSB Abd is still distended but not as tight Ext with 1+ edema   WOuld continue diuresis with IV lasix   Drop rate a bit   and follow Cr      HOld on inotropes for now       Dorris Carnes MD

## 2021-08-13 NOTE — Progress Notes (Signed)
PROGRESS NOTE   Lawrence Harper  ZYS:[id number] DOB: 1932-02-16 DOA: 08/10/2021 PCP: System, Provider Not In   Chief Complaint  Patient presents with   Shortness of Breath   Level of care: Telemetry  Brief Admission History:  87 y.o. male with medical history significant of paroxysmal A. fib, coronary artery disease, hypertension, obesity, chronic kidney disease, and diastolic heart failure.  Patient splits time between [facility] and [place].  Most of patient's primary care and specialist reside in [place], fortunately these records are available through epic for review.  Patient states that he has gained 40 to 50 pounds in the last 2 months.  Apparently patient had stopped taking his Lasix for a time period and was just resumed about a month ago his doctors in [place] have been encouraging him to come to the hospital for about 10 days.  Patient states that the swelling in his abdomen and his shortness of breath with exertion has continued to get worse prompting him to come to the ER.  He states his baseline weight is around 200 pounds.  Weight in the ER is 245 pounds.  Stay complicated with a pause of 4 seconds  Assessment & Plan:   Principal Problem:   Acute exacerbation of CHF (congestive heart failure) ([place]) Active Problems:   Hypertension   Atrial fibrillation (HCC)   CAD (coronary artery disease)   Chronic kidney disease   Goals of care, counseling/discussion   Acute respiratory failure (HCC)   Sinus pause   Abdominal distension   Acute HFpEF / Right heart failure  -Patient responding favorably to IV Lasix infusion however remains very volume overloaded. -Cardiology team starting IV Lasix infusion 08/12/2021 -Continue to monitor intake output Daily weights and electrolytes closely. -Patient remains approximately 30 pounds over baseline weight of 193 pounds from 04/2021  Intake/Output Summary (Last 24 hours) at 08/13/2021 1838 Last data filed at 08/13/2021 1836 Gross  per 24 hour  Intake 1411.15 ml  Output 4200 ml  Net -2788.85 ml   Filed Weights   08/11/21 0508 08/12/21 0500 08/13/21 0500  Weight: 110 kg 109.3 kg 105.9 kg   Chronic atrial fibrillation -Lopressor 25 mg twice daily held due to pauses greater than 4 seconds -Cardiology team monitoring telemetry -Heart rate has remained stable and controlled on no AV nodal blocking agent -Apixaban 2.5 mg twice daily for full anticoagulation  Nocturnal pauses -Appreciate cardiology consultation recommendations -Hold Lopressor at this time -Hold all AV nodal blocking agents  Coronary artery disease -Status post NSTEMI in September 2014 with DES to LAD. -He is on statin therapy daily  Moderate aortic stenosis -Noted on recent 2D echocardiogram continue outpatient follow-up with cardiology service  Stage IV CKD -Creatinine has improved slightly with diuresis continue to follow closely  DNR present on admission -It was discussed with daughter who is agreeable to BiPAP temporarily if required  DVT prophylaxis: Apixaban Code Status: DNR Family Communication: Daughter Disposition: Anticipate home Status is: Inpatient  Remains inpatient appropriate because: He continues to require IV diuresis with Lasix and now on a continuous infusion   Consultants:  Cardiology (inpatient) [facility]   Procedures:  N/a   Antimicrobials:  N/a   Subjective: Patient feeling better, diuresing well on IV lasix infusion.  Objective: Vitals:   08/12/21 2208 08/13/21 0431 08/13/21 0500 08/13/21 1321  BP: 111/69 (!) 109/53  120/64  Pulse: 60 (!) 58  82  Resp: 18 19  16   Temp: 98.1 F (36.7 C) 98.6 F (37 C)  98.2 F (36.8 C)  TempSrc:  Oral    SpO2: 99% 96%  98%  Weight:   105.9 kg   Height:        Intake/Output Summary (Last 24 hours) at 08/13/2021 1837 Last data filed at 08/13/2021 1836 Gross per 24 hour  Intake 1411.15 ml  Output 4200 ml  Net -2788.85 ml   Filed Weights   08/11/21 0508  08/12/21 0500 08/13/21 0500  Weight: 110 kg 109.3 kg 105.9 kg    Examination:  General exam: frail elderly male, sitting up in chair, appears volume overloaded, Appears calm and comfortable  Respiratory system: bibasilar crackles.  Respiratory effort normal. Cardiovascular system: normal S1 & S2 heard. No JVD, murmurs, rubs, gallops or clicks. No pedal edema. Gastrointestinal system: Abdomen is distended, tympanitic, soft and nontender. No organomegaly or masses felt. Normal bowel sounds heard. Central nervous system: Alert and oriented. No focal neurological deficits. Extremities: 2+ edema BLEs. Pitting.  Skin: No rashes, lesions or ulcers Psychiatry: Judgement and insight appear normal. Mood & affect appropriate.   Data Reviewed: I have personally reviewed following labs and imaging studies  CBC: Recent Labs  Lab 08/10/21 1036 08/12/21 0605 08/13/21 0554  WBC 6.2 5.0 5.1  NEUTROABS 4.8  --   --   HGB 9.7* 9.1* 8.6*  HCT 31.4* 29.3* 28.3*  MCV 102.3* 100.0 99.6  PLT 287 270 209    Basic Metabolic Panel: Recent Labs  Lab 08/10/21 1036 08/11/21 0618 08/12/21 0605 08/13/21 0554  NA 136 138 137 135  K 4.7 4.9 4.5 4.5  CL 104 106 103 101  CO2 21* 24 24 25   GLUCOSE 91 91 83 89  BUN 70* 70* 68* 72*  CREATININE 2.86* 2.77* 2.51* 2.73*  CALCIUM 8.9 8.9 8.7* 8.7*  MG 2.2  --   --  2.1    GFR: Estimated Creatinine Clearance: 21.7 mL/min (A) (by C-G formula based on SCr of 2.73 mg/dL (H)).  Liver Function Tests: Recent Labs  Lab 08/10/21 1036 08/13/21 0554  AST 20 19  ALT 12 11  ALKPHOS 143* 126  BILITOT 0.7 0.7  PROT 8.1 6.9  ALBUMIN 3.0* 2.5*    CBG: No results for input(s): GLUCAP in the last 168 hours.  Recent Results (from the past 240 hour(s))  Resp Panel by RT-PCR (Flu A&B, Covid) Nasopharyngeal Swab     Status: None   Collection Time: 08/10/21 11:45 AM   Specimen: Nasopharyngeal Swab; Nasopharyngeal(NP) swabs in vial transport medium  Result Value  Ref Range Status   SARS Coronavirus 2 by RT PCR NEGATIVE NEGATIVE Final    Comment: (NOTE) SARS-CoV-2 target nucleic acids are NOT DETECTED.  The SARS-CoV-2 RNA is generally detectable in upper respiratory specimens during the acute phase of infection. The lowest concentration of SARS-CoV-2 viral copies this assay can detect is 138 copies/mL. A negative result does not preclude SARS-Cov-2 infection and should not be used as the sole basis for treatment or other patient management decisions. A negative result may occur with  improper specimen collection/handling, submission of specimen other than nasopharyngeal swab, presence of viral mutation(s) within the areas targeted by this assay, and inadequate number of viral copies(<138 copies/mL). A negative result must be combined with clinical observations, patient history, and epidemiological information. The expected result is Negative.  Fact Sheet for Patients:  EntrepreneurPulse.com.au  Fact Sheet for Healthcare Providers:  IncredibleEmployment.be  This test is no t yet approved or cleared by the [place] FDA and  has been  authorized for detection and/or diagnosis of SARS-CoV-2 by FDA under an Emergency Use Authorization (EUA). This EUA will remain  in effect (meaning this test can be used) for the duration of the COVID-19 declaration under Section 564(b)(1) of the Act, 21 U.S.C.section 360bbb-3(b)(1), unless the authorization is terminated  or revoked sooner.       Influenza A by PCR NEGATIVE NEGATIVE Final   Influenza B by PCR NEGATIVE NEGATIVE Final    Comment: (NOTE) The Xpert Xpress SARS-CoV-2/FLU/RSV plus assay is intended as an aid in the diagnosis of influenza from Nasopharyngeal swab specimens and should not be used as a sole basis for treatment. Nasal washings and aspirates are unacceptable for Xpert Xpress SARS-CoV-2/FLU/RSV testing.  Fact Sheet for  Patients: EntrepreneurPulse.com.au  Fact Sheet for Healthcare Providers: IncredibleEmployment.be  This test is not yet approved or cleared by the [place] FDA and has been authorized for detection and/or diagnosis of SARS-CoV-2 by FDA under an Emergency Use Authorization (EUA). This EUA will remain in effect (meaning this test can be used) for the duration of the COVID-19 declaration under Section 564(b)(1) of the Act, 21 U.S.C. section 360bbb-3(b)(1), unless the authorization is terminated or revoked.  Performed at [facility], [address], [place], [place] [address]      Radiology Studies: US Abdomen Complete  Result Date: 08/12/2021 CLINICAL DATA:  Abdominal distension.  Cholecystectomy. EXAM: ABDOMEN ULTRASOUND COMPLETE COMPARISON:  None. FINDINGS: Gallbladder: Surgically absent. Common bile duct: Diameter: 6 mm Liver: No focal lesion identified. Increase in parenchymal echogenicity. Portal vein is patent on color Doppler imaging with normal direction of blood flow towards the liver. IVC: No abnormality visualized. Pancreas: Not well seen secondary to overlying bowel gas. Spleen: Size and appearance within normal limits. Right Kidney: Length: 13.5. Echogenicity is increased. There is a 12.6 x 8.4 x 8.0 cm cyst in the superior pole the right kidney. There is a 4.0 by 3.5 x 3.3 cm cyst in the mid right kidney. No hydronephrosis. Left Kidney: Length: 14.1. Echogenicity is increased. No hydronephrosis. There is a mid renal cysts measuring 3.5 x 2.9 x 2.5 cm. There is a lower pole cyst measuring 2.3 x 2.4 x 2.8 cm. Abdominal aorta: No aneurysm visualized. Distal portion and bifurcation not well seen. Measured portion of the aorta 2.7 cm. Other findings: Small volume ascites. IMPRESSION: 1. Small volume ascites. 2. Bilateral echogenic kidneys worrisome for medical renal disease. 3. Bilateral renal cysts. Largest cyst is in the superior pole the right  kidney measuring 12.6 cm. 4. Pancreas not well seen. 5. Cholecystectomy. 6. Echogenic liver suggestive of hepatocellular disease such as fatty infiltration. Electronically Signed   By: Ronney Asters M.D.   On: 08/12/2021 15:10    Scheduled Meds:  apixaban  2.5 mg Oral BID   atorvastatin  40 mg Oral q1800   calcium-vitamin D  1 tablet Oral Daily   ferrous sulfate  325 mg Oral Q breakfast   folic acid  1 mg Oral Daily   nystatin   Topical TID   sodium chloride flush  3 mL Intravenous Q12H   timolol  1 drop Both Eyes BID   Continuous Infusions:  sodium chloride     furosemide (LASIX) 200 mg in dextrose 5% 100 mL (2mg /mL) infusion 6 mg/hr (08/13/21 1537)    LOS: 3 days   Time spent: 35 mins   Chontel Warning Wynetta Emery, MD How to contact the [facility] Attending or Consulting provider [place] or covering provider during after hours 7P -  7A, for this patient?  Check the care team in [facility] and look for a) attending/consulting TRH provider listed and b) the [facility] team listed Log into [email or web address] and use [place]'s universal password to access. If you do not have the password, please contact the hospital operator. Locate the [facility] provider you are looking for under [facility] and page to a number that you can be directly reached. If you still have difficulty reaching the provider, please page the [facility] (Director on Call) for the Hospitalists listed on amion for assistance.  08/13/2021, 6:37 PM

## 2021-08-14 DIAGNOSIS — N184 Chronic kidney disease, stage 4 (severe): Secondary | ICD-10-CM | POA: Diagnosis not present

## 2021-08-14 DIAGNOSIS — I455 Other specified heart block: Secondary | ICD-10-CM

## 2021-08-14 DIAGNOSIS — R0902 Hypoxemia: Secondary | ICD-10-CM

## 2021-08-14 DIAGNOSIS — I5033 Acute on chronic diastolic (congestive) heart failure: Secondary | ICD-10-CM | POA: Diagnosis not present

## 2021-08-14 DIAGNOSIS — I4811 Longstanding persistent atrial fibrillation: Secondary | ICD-10-CM | POA: Diagnosis not present

## 2021-08-14 DIAGNOSIS — R140 Abdominal distension (gaseous): Secondary | ICD-10-CM | POA: Diagnosis not present

## 2021-08-14 LAB — CBC
HCT: 29 % — ABNORMAL LOW (ref 39.0–52.0)
Hemoglobin: 9 g/dL — ABNORMAL LOW (ref 13.0–17.0)
MCH: 30.6 pg (ref 26.0–34.0)
MCHC: 31 g/dL (ref 30.0–36.0)
MCV: 98.6 fL (ref 80.0–100.0)
Platelets: 273 10*3/uL (ref 150–400)
RBC: 2.94 MIL/uL — ABNORMAL LOW (ref 4.22–5.81)
RDW: 17.5 % — ABNORMAL HIGH (ref 11.5–15.5)
WBC: 5.5 10*3/uL (ref 4.0–10.5)
nRBC: 0 % (ref 0.0–0.2)

## 2021-08-14 LAB — BASIC METABOLIC PANEL
Anion gap: 9 (ref 5–15)
BUN: 70 mg/dL — ABNORMAL HIGH (ref 8–23)
CO2: 29 mmol/L (ref 22–32)
Calcium: 8.8 mg/dL — ABNORMAL LOW (ref 8.9–10.3)
Chloride: 98 mmol/L (ref 98–111)
Creatinine, Ser: 2.65 mg/dL — ABNORMAL HIGH (ref 0.61–1.24)
GFR, Estimated: 23 mL/min — ABNORMAL LOW (ref >60)
Glucose, Bld: 85 mg/dL (ref 70–99)
Potassium: 4.4 mmol/L (ref 3.5–5.1)
Sodium: 136 mmol/L (ref 135–145)

## 2021-08-14 LAB — MAGNESIUM: Magnesium: 2 mg/dL (ref 1.7–2.4)

## 2021-08-14 MED ORDER — ALBUTEROL SULFATE (2.5 MG/3ML) 0.083% IN NEBU
2.5000 mg | INHALATION_SOLUTION | RESPIRATORY_TRACT | Status: DC | PRN
  Administered 2021-08-14: 05:00:00 2.5 mg via RESPIRATORY_TRACT
  Filled 2021-08-14: qty 3

## 2021-08-14 NOTE — Care Management Important Message (Signed)
Important Message  Patient Details  Name: Lawrence Harper MRN: [id number] Date of Birth: 05/30/1932   Medicare Important Message Given:  Yes     Tommy Medal 08/14/2021, 12:55 PM

## 2021-08-14 NOTE — Progress Notes (Signed)
PROGRESS NOTE   Lawrence Harper  IRS:[id number] DOB: November 02, 1931 DOA: 08/10/2021 PCP: System, Provider Not In   Chief Complaint  Patient presents with   Shortness of Breath   Level of care: Telemetry  Brief Admission History:  87 y.o. male with medical history significant of paroxysmal A. fib, coronary artery disease, hypertension, obesity, chronic kidney disease, and diastolic heart failure.  Patient splits time between [facility] and [place].  Most of patient's primary care and specialist reside in [place], fortunately these records are available through epic for review.  Patient states that he has gained 40 to 50 pounds in the last 2 months.  Apparently patient had stopped taking his Lasix for a time period and was just resumed about a month ago his doctors in [place] have been encouraging him to come to the hospital for about 10 days.  Patient states that the swelling in his abdomen and his shortness of breath with exertion has continued to get worse prompting him to come to the ER.  He states his baseline weight is around 200 pounds.  Weight in the ER is 245 pounds.  Stay complicated with a pause of 4 seconds  Assessment & Plan:   Principal Problem:   Acute exacerbation of CHF (congestive heart failure) ([place]) Active Problems:   Hypertension   Atrial fibrillation (HCC)   CAD (coronary artery disease)   Chronic kidney disease   Goals of care, counseling/discussion   Acute respiratory failure (HCC)   Sinus pause   Abdominal distension   Acute HFpEF / Right heart failure  -Patient responding favorably to IV Lasix infusion however remains very volume overloaded. -Cardiology team started IV Lasix infusion 08/12/2021, continue at least 1 more night -Continue to monitor intake output Daily weights and electrolytes closely. -Patient remains approximately 25 pounds over baseline weight of 193 pounds from 04/2021  Intake/Output Summary (Last 24 hours) at 08/14/2021 1621 Last data  filed at 08/14/2021 1500 Gross per 24 hour  Intake 790.14 ml  Output 2600 ml  Net -1809.86 ml   Filed Weights   08/12/21 0500 08/13/21 0500 08/14/21 0519  Weight: 109.3 kg 105.9 kg 102.3 kg   Chronic atrial fibrillation -Lopressor 25 mg twice daily held due to pauses greater than 4 seconds -Cardiology team monitoring telemetry -Heart rate has remained stable and controlled on no AV nodal blocking agent -Apixaban 2.5 mg twice daily for full anticoagulation  Nocturnal pauses -Appreciate cardiology consultation recommendations -Hold Lopressor at this time -Hold all AV nodal blocking agents  Coronary artery disease -Status post NSTEMI in September 2014 with DES to LAD. -He is on statin therapy daily  Moderate aortic stenosis -Noted on recent 2D echocardiogram continue outpatient follow-up with cardiology service  Stage IV CKD -Creatinine has improved slightly with diuresis continue to follow closely  DNR present on admission -It was discussed with daughter who is agreeable to BiPAP temporarily if required  DVT prophylaxis: Apixaban Code Status: DNR Family Communication: Daughter Disposition: Anticipate home Status is: Inpatient  Remains inpatient appropriate because: He continues to require IV diuresis with Lasix and now on a continuous infusion   Consultants:  Cardiology (inpatient) [facility]   Procedures:  N/a   Antimicrobials:  N/a   Subjective: He continues to diurese well, feeling less SOB today, no chest pain and no palpitations  Objective: Vitals:   08/13/21 2009 08/14/21 0416 08/14/21 0448 08/14/21 0519  BP: (!) 126/56 116/65  111/60  Pulse: 65 (!) 57  (!) 55  Resp: 18 15  18  Temp: 98.4 F (36.9 C)   99 F (37.2 C)  TempSrc: Oral   Oral  SpO2: 100% 97% 99% 100%  Weight:    102.3 kg  Height:        Intake/Output Summary (Last 24 hours) at 08/14/2021 1621 Last data filed at 08/14/2021 1500 Gross per 24 hour  Intake 790.14 ml  Output 2600 ml  Net  -1809.86 ml   Filed Weights   08/12/21 0500 08/13/21 0500 08/14/21 0519  Weight: 109.3 kg 105.9 kg 102.3 kg    Examination:  General exam: frail elderly male, sitting up in chair, appears volume overloaded, Appears calm and comfortable  Respiratory system: bibasilar crackles.  Respiratory effort normal. Cardiovascular system: normal S1 & S2 heard. No JVD, murmurs, rubs, gallops or clicks. No pedal edema. Gastrointestinal system: Abdomen is distended, tympanitic, soft and nontender. No organomegaly or masses felt. Normal bowel sounds heard. Central nervous system: Alert and oriented. No focal neurological deficits. Extremities: 1+ edema BLEs. Markedly improved.  Skin: No rashes, lesions or ulcers Psychiatry: Judgement and insight appear normal. Mood & affect appropriate.   Data Reviewed: I have personally reviewed following labs and imaging studies  CBC: Recent Labs  Lab 08/10/21 1036 08/12/21 0605 08/13/21 0554 08/14/21 0539  WBC 6.2 5.0 5.1 5.5  NEUTROABS 4.8  --   --   --   HGB 9.7* 9.1* 8.6* 9.0*  HCT 31.4* 29.3* 28.3* 29.0*  MCV 102.3* 100.0 99.6 98.6  PLT 287 270 258 702    Basic Metabolic Panel: Recent Labs  Lab 08/10/21 1036 08/11/21 0618 08/12/21 0605 08/13/21 0554 08/14/21 0539  NA 136 138 137 135 136  K 4.7 4.9 4.5 4.5 4.4  CL 104 106 103 101 98  CO2 21* 24 24 25 29   GLUCOSE 91 91 83 89 85  BUN 70* 70* 68* 72* 70*  CREATININE 2.86* 2.77* 2.51* 2.73* 2.65*  CALCIUM 8.9 8.9 8.7* 8.7* 8.8*  MG 2.2  --   --  2.1 2.0    GFR: Estimated Creatinine Clearance: 22 mL/min (A) (by C-G formula based on SCr of 2.65 mg/dL (H)).  Liver Function Tests: Recent Labs  Lab 08/10/21 1036 08/13/21 0554  AST 20 19  ALT 12 11  ALKPHOS 143* 126  BILITOT 0.7 0.7  PROT 8.1 6.9  ALBUMIN 3.0* 2.5*    CBG: No results for input(s): GLUCAP in the last 168 hours.  Recent Results (from the past 240 hour(s))  Resp Panel by RT-PCR (Flu A&B, Covid) Nasopharyngeal Swab      Status: None   Collection Time: 08/10/21 11:45 AM   Specimen: Nasopharyngeal Swab; Nasopharyngeal(NP) swabs in vial transport medium  Result Value Ref Range Status   SARS Coronavirus 2 by RT PCR NEGATIVE NEGATIVE Final    Comment: (NOTE) SARS-CoV-2 target nucleic acids are NOT DETECTED.  The SARS-CoV-2 RNA is generally detectable in upper respiratory specimens during the acute phase of infection. The lowest concentration of SARS-CoV-2 viral copies this assay can detect is 138 copies/mL. A negative result does not preclude SARS-Cov-2 infection and should not be used as the sole basis for treatment or other patient management decisions. A negative result may occur with  improper specimen collection/handling, submission of specimen other than nasopharyngeal swab, presence of viral mutation(s) within the areas targeted by this assay, and inadequate number of viral copies(<138 copies/mL). A negative result must be combined with clinical observations, patient history, and epidemiological information. The expected result is Negative.  Fact Sheet  for Patients:  EntrepreneurPulse.com.au  Fact Sheet for Healthcare Providers:  IncredibleEmployment.be  This test is no t yet approved or cleared by the [place] FDA and  has been authorized for detection and/or diagnosis of SARS-CoV-2 by FDA under an Emergency Use Authorization (EUA). This EUA will remain  in effect (meaning this test can be used) for the duration of the COVID-19 declaration under Section 564(b)(1) of the Act, 21 U.S.C.section 360bbb-3(b)(1), unless the authorization is terminated  or revoked sooner.       Influenza A by PCR NEGATIVE NEGATIVE Final   Influenza B by PCR NEGATIVE NEGATIVE Final    Comment: (NOTE) The Xpert Xpress SARS-CoV-2/FLU/RSV plus assay is intended as an aid in the diagnosis of influenza from Nasopharyngeal swab specimens and should not be used as a sole basis  for treatment. Nasal washings and aspirates are unacceptable for Xpert Xpress SARS-CoV-2/FLU/RSV testing.  Fact Sheet for Patients: EntrepreneurPulse.com.au  Fact Sheet for Healthcare Providers: IncredibleEmployment.be  This test is not yet approved or cleared by the [place] FDA and has been authorized for detection and/or diagnosis of SARS-CoV-2 by FDA under an Emergency Use Authorization (EUA). This EUA will remain in effect (meaning this test can be used) for the duration of the COVID-19 declaration under Section 564(b)(1) of the Act, 21 U.S.C. section 360bbb-3(b)(1), unless the authorization is terminated or revoked.  Performed at [facility], [address], [place], [place] [address]      Radiology Studies: No results found.  Scheduled Meds:  apixaban  2.5 mg Oral BID   atorvastatin  40 mg Oral q1800   calcium-vitamin D  1 tablet Oral Daily   ferrous sulfate  325 mg Oral Q breakfast   folic acid  1 mg Oral Daily   nystatin   Topical TID   sodium chloride flush  3 mL Intravenous Q12H   timolol  1 drop Both Eyes BID   Continuous Infusions:  sodium chloride     furosemide (LASIX) 200 mg in dextrose 5% 100 mL (2mg /mL) infusion 6 mg/hr (08/13/21 1537)    LOS: 4 days   Time spent: 35 mins   Imonie Tuch Wynetta Emery, MD How to contact the [facility] Attending or Consulting provider [place] or covering provider during after hours [place], for this patient?  Check the care team in [facility] and look for a) attending/consulting TRH provider listed and b) the [facility] team listed Log into [email or web address] and use [place]'s universal password to access. If you do not have the password, please contact the hospital operator. Locate the [facility] provider you are looking for under [facility] and page to a number that you can be directly reached. If you still have difficulty reaching the provider, please page the [facility] (Director on Call) for the Hospitalists  listed on amion for assistance.  08/14/2021, 4:21 PM

## 2021-08-14 NOTE — Plan of Care (Signed)

## 2021-08-14 NOTE — Progress Notes (Addendum)
Progress Note  Patient Name: Lawrence Harper Date of Encounter: 08/14/2021  [facility] Cardiologist:  Dr. Cornelious Bryant with [facility]) in [place], [place]   He had an episode of PND overnight around 0300 which he thinks was due to him sliding down in the bed. Breathing improved this AM. No chest pain or palpitations. Says he is planning to move back to [facility] permanently next year.   Inpatient Medications    Scheduled Meds:  apixaban  2.5 mg Oral BID   atorvastatin  40 mg Oral q1800   calcium-vitamin D  1 tablet Oral Daily   ferrous sulfate  325 mg Oral Q breakfast   folic acid  1 mg Oral Daily   nystatin   Topical TID   sodium chloride flush  3 mL Intravenous Q12H   timolol  1 drop Both Eyes BID   Continuous Infusions:  sodium chloride     furosemide (LASIX) 200 mg in dextrose 5% 100 mL (2mg /mL) infusion 6 mg/hr (08/13/21 1537)   PRN Meds: sodium chloride, acetaminophen, albuterol, nitroGLYCERIN, ondansetron (ZOFRAN) IV, sodium chloride flush   Vital Signs    Vitals:   08/13/21 2009 08/14/21 0416 08/14/21 0448 08/14/21 0519  BP: (!) 126/56 116/65  111/60  Pulse: 65 (!) 57  (!) 55  Resp: 18 15  18   Temp: 98.4 F (36.9 C)   99 F (37.2 C)  TempSrc: Oral   Oral  SpO2: 100% 97% 99% 100%  Weight:    102.3 kg  Height:        Intake/Output Summary (Last 24 hours) at 08/14/2021 0741 Last data filed at 08/14/2021 0530 Gross per 24 hour  Intake 1152.71 ml  Output 4400 ml  Net -3247.29 ml   Last 3 Weights 08/14/2021 08/13/2021 08/12/2021  Weight (lbs) 225 lb 8.5 oz 233 lb 7.5 oz 240 lb 15.4 oz  Weight (kg) 102.3 kg 105.9 kg 109.3 kg      Telemetry    Atrial fibrillation, HR in 60's to 70's. Episode of NSVT for 7 beats.  - Personally Reviewed  ECG    No new tracings.   Physical Exam   GEN: Pleasant elderly male appearing in no acute distress.   Neck: JVD remains elevated to jaw.  Cardiac: Irregularly  irregular, 2/6 SEM along RUSB.  Respiratory: Mild rales along bases bilaterally.  GI: Still with significant abdominal distension. Hernia also noted.  MS: Trace lower extremity edema; No deformity. Neuro:  Nonfocal  Psych: Normal affect   Labs    High Sensitivity Troponin:   Recent Labs  Lab 08/10/21 1036 08/10/21 1220  TROPONINIHS 10 9     Chemistry Recent Labs  Lab 08/10/21 1036 08/11/21 0618 08/12/21 0605 08/13/21 0554 08/14/21 0539  NA 136   < > 137 135 136  K 4.7   < > 4.5 4.5 4.4  CL 104   < > 103 101 98  CO2 21*   < > 24 25 29   GLUCOSE 91   < > 83 89 85  BUN 70*   < > 68* 72* 70*  CREATININE 2.86*   < > 2.51* 2.73* 2.65*  CALCIUM 8.9   < > 8.7* 8.7* 8.8*  MG 2.2  --   --  2.1 2.0  PROT 8.1  --   --  6.9  --   ALBUMIN 3.0*  --   --  2.5*  --   AST  20  --   --  19  --   ALT 12  --   --  11  --   ALKPHOS 143*  --   --  126  --   BILITOT 0.7  --   --  0.7  --   GFRNONAA 21*   < > 24* 22* 23*  ANIONGAP 11   < > 10 9 9    < > = values in this interval not displayed.    Lipids No results for input(s): CHOL, TRIG, HDL, LABVLDL, LDLCALC, CHOLHDL in the last 168 hours.  Hematology Recent Labs  Lab 08/12/21 0605 08/13/21 0554 08/14/21 0539  WBC 5.0 5.1 5.5  RBC 2.93* 2.84* 2.94*  HGB 9.1* 8.6* 9.0*  HCT 29.3* 28.3* 29.0*  MCV 100.0 99.6 98.6  MCH 31.1 30.3 30.6  MCHC 31.1 30.4 31.0  RDW 17.7* 17.6* 17.5*  PLT 270 258 273   Thyroid No results for input(s): TSH, FREET4 in the last 168 hours.  BNP Recent Labs  Lab 08/10/21 1036  BNP 522.0*    DDimer No results for input(s): DDIMER in the last 168 hours.   Radiology    US Abdomen Complete  Result Date: 08/12/2021 CLINICAL DATA:  Abdominal distension.  Cholecystectomy. EXAM: ABDOMEN ULTRASOUND COMPLETE COMPARISON:  None. FINDINGS: Gallbladder: Surgically absent. Common bile duct: Diameter: 6 mm Liver: No focal lesion identified. Increase in parenchymal echogenicity. Portal vein is patent on color  Doppler imaging with normal direction of blood flow towards the liver. IVC: No abnormality visualized. Pancreas: Not well seen secondary to overlying bowel gas. Spleen: Size and appearance within normal limits. Right Kidney: Length: 13.5. Echogenicity is increased. There is a 12.6 x 8.4 x 8.0 cm cyst in the superior pole the right kidney. There is a 4.0 by 3.5 x 3.3 cm cyst in the mid right kidney. No hydronephrosis. Left Kidney: Length: 14.1. Echogenicity is increased. No hydronephrosis. There is a mid renal cysts measuring 3.5 x 2.9 x 2.5 cm. There is a lower pole cyst measuring 2.3 x 2.4 x 2.8 cm. Abdominal aorta: No aneurysm visualized. Distal portion and bifurcation not well seen. Measured portion of the aorta 2.7 cm. Other findings: Small volume ascites. IMPRESSION: 1. Small volume ascites. 2. Bilateral echogenic kidneys worrisome for medical renal disease. 3. Bilateral renal cysts. Largest cyst is in the superior pole the right kidney measuring 12.6 cm. 4. Pancreas not well seen. 5. Cholecystectomy. 6. Echogenic liver suggestive of hepatocellular disease such as fatty infiltration. Electronically Signed   By: Ronney Asters M.D.   On: 08/12/2021 15:10    Cardiac Studies   Echocardiogram: 08/11/2021 IMPRESSIONS     1. Left ventricular ejection fraction, by estimation, is 65 to 70%. The  left ventricle has normal function. The left ventricle has no regional  wall motion abnormalities. There is mild left ventricular hypertrophy.  Left ventricular diastolic parameters  are indeterminate.   2. Right ventricular systolic function is severely reduced. The right  ventricular size is moderately enlarged. There is mildly elevated  pulmonary artery systolic pressure.   3. Left atrial size was mildly dilated.   4. Right atrial size was severely dilated.   5. Trivial mitral valve regurgitation.   6. Tricuspid valve regurgitation is severe.   7. AV is thickened, calcified. Peak and mean gradients  throught the valve  are 33 and 17 mm Hg respectviely AVA (VTI) is 0.84 cm 2. Dimensionless  index is 0.31 consistent with moderate AS. Marland Kitchen The aortic  valve is  tricuspid. Aortic valve regurgitation is  not visualized.   8. The inferior vena cava is dilated in size with <50% respiratory  variability, suggesting right atrial pressure of 15 mmHg.    Patient Profile     87 y.o. male w/ PMH of CAD (s/p NSTEMI in 9/14 with DES to LAD), permanent atrial fibrillation, HFpEF, ASD, HTN, HLD, Type 2 DM, CKD (Creatinine in Care Everywhere since 04/2020 ranges 1.6-2.8; last SCr 2.1 in 9/22), GERD, peripheral arterial disease, glaucoma and obesity who is currently admitted for an acute CHF exacerbation. Cardiology consulted due to nocturnal pauses > 4 seconds.   Assessment & Plan    1. Acute HFpEF/RV Dysfunction - Presented with a 40 lb weight gain and associated dyspnea, abdominal distension and lower extremity edema. Echo this admission shows a preserved EF of 65-70% but severely reduced RV function and severe TR. Abdominal US shows small volume ascites and also suggestive of fatty liver disease. Will need a sleep study as an outpatient.  - Was transitioned to a Lasix drip 2 days ago and has been responding well with a net output of -3.2 L yesterday and overall -9.0 L this admission and weight has declined from 245 lbs to 225 lbs. Anticipate several more days of IV diuresis as baseline weight is around 200 lbs. Would continue Lasix drip at current dose of 6 mg/hr as his renal function worsened at a higher dose and he is having a good response to the current regimen. Would plan to switch to Torsemide at discharge.    2. Permanent Atrial Fibrillation - Rates remain in the 60's to 70's. No longer on Lopressur due to pauses this admission.  - He remains on Eliquis 2.5mg  BID for anticoagulation which is the appropriate dose given his age and renal function.    3. Nocturnal Pause - Noted to have 4 second  nocturnal pauses this admission and BB therapy was discontinued with no recurrence. Would recommend a sleep study as an outpatient due to his nocturnal pauses and RV dysfunction by echocardiogram.   4. CAD - S/p NSTEMI in 9/14 with DES to LAD. No reports of recent angina and Hs Troponin values have been negative this admission. Remains on statin therapy but not on ASA due to the need for anticoagulation and BB discontinued this admission due to pauses.  5. Aortic Stenosis - Mild by echo in 2017 and moderate by echo this admission. Will need to be followed by his Cardiologist in [place] as an outpatient or by [facility] if returning to [place].    6. Stage 4 CKD - Abdominal US this admission shows echogenic kidneys consistent with medical renal disease and also noted to have bilateral renal cysts. - Creatinine was elevated to 2.86 on admission, improved to 2.65 today.    7. Anemia - Hgb at 9.0 this AM and likely due to his CKD. No reports of active bleeding.    For questions or updates, please contact [place] Please consult [email or web address] for contact info under        Signed, Erma Heritage, PA-C  08/14/2021, 7:41 AM    Pt seen and examined   I agree with findings as noted by B Strader above  Pt feels better than yesterday ON exam:    Neck:  JVP is increased (in setting of severe TR) Lungs are CTA Caridac exam   Irreg irreg  III/VI systolic murmrr LSB Abd is distended    Softer than yesterday Ext with  triv edema  Skin is dry  Continue IV diuresis   Down almost 9 L      I would not back off of IV lasix gtt yet Will need some form of diuretic (oral) at d/c   ? Torsemide After D/C would get sleep study    Given age, renal function continue medical Rx only beyond this   Dorris Carnes MD

## 2021-08-15 DIAGNOSIS — I509 Heart failure, unspecified: Secondary | ICD-10-CM | POA: Diagnosis not present

## 2021-08-15 LAB — BASIC METABOLIC PANEL
Anion gap: 9 (ref 5–15)
BUN: 73 mg/dL — ABNORMAL HIGH (ref 8–23)
CO2: 29 mmol/L (ref 22–32)
Calcium: 8.7 mg/dL — ABNORMAL LOW (ref 8.9–10.3)
Chloride: 98 mmol/L (ref 98–111)
Creatinine, Ser: 2.75 mg/dL — ABNORMAL HIGH (ref 0.61–1.24)
GFR, Estimated: 22 mL/min — ABNORMAL LOW (ref >60)
Glucose, Bld: 84 mg/dL (ref 70–99)
Potassium: 4.2 mmol/L (ref 3.5–5.1)
Sodium: 136 mmol/L (ref 135–145)

## 2021-08-15 NOTE — Progress Notes (Signed)
PROGRESS NOTE   Lawrence Harper  KYH:[id number] DOB: 27-Apr-1932 DOA: 08/10/2021 PCP: System, Provider Not In   Chief Complaint  Patient presents with   Shortness of Breath   Level of care: Telemetry  Brief Admission History:  87 y.o. male with medical history significant of paroxysmal A. fib, coronary artery disease, hypertension, obesity, chronic kidney disease, and diastolic heart failure.  Patient splits time between [facility] and [place].  Most of patient's primary care and specialist reside in [place], fortunately these records are available through epic for review.  Patient states that he has gained 40 to 50 pounds in the last 2 months.  Apparently patient had stopped taking his Lasix for a time period and was just resumed about a month ago his doctors in [place] have been encouraging him to come to the hospital for about 10 days.  Patient states that the swelling in his abdomen and his shortness of breath with exertion has continued to get worse prompting him to come to the ER.  He states his baseline weight is around 200 pounds.  Weight in the ER is 245 pounds.  Stay complicated with a pause of 4 seconds  Assessment & Plan:   Principal Problem:   Acute exacerbation of CHF (congestive heart failure) ([place]) Active Problems:   Hypertension   Atrial fibrillation (HCC)   CAD (coronary artery disease)   Chronic kidney disease   Goals of care, counseling/discussion   Acute respiratory failure (HCC)   Sinus pause   Abdominal distension   Hypoxia   Acute HFpEF / Right heart failure  -Patient responding favorably to IV Lasix infusion however remains very volume overloaded. -Cardiology team started IV Lasix infusion 08/12/2021, reduce to 4mg /hr and hopefully can transition to oral torsemide 12/25  -Continue to monitor intake output Daily weights and electrolytes closely. -Patient remains approximately 20 pounds over baseline weight of 193 pounds from 04/2021  Intake/Output  Summary (Last 24 hours) at 08/15/2021 1007 Last data filed at 08/15/2021 0900 Gross per 24 hour  Intake 753.01 ml  Output 4500 ml  Net -3746.99 ml   Filed Weights   08/13/21 0500 08/14/21 0519 08/15/21 0500  Weight: 105.9 kg 102.3 kg 99.8 kg   Chronic atrial fibrillation -Lopressor 25 mg twice daily held due to pauses greater than 4 seconds -Cardiology team monitoring telemetry -Heart rate has remained stable and controlled on no AV nodal blocking agent -Apixaban 2.5 mg twice daily for full anticoagulation  Nocturnal pauses -Appreciate cardiology consultation recommendations -Hold Lopressor at this time -Hold all AV nodal blocking agents  Coronary artery disease -Status post NSTEMI in September 2014 with DES to LAD. -He is on statin therapy daily  Moderate aortic stenosis -Noted on recent 2D echocardiogram continue outpatient follow-up with cardiology service  Right leg numbness/pain - check ABI studies   Stage IV CKD -Creatinine has improved slightly with diuresis continue to follow closely  DNR present on admission -It was discussed with daughter who is agreeable to BiPAP temporarily if required  DVT prophylaxis: Apixaban Code Status: DNR Family Communication: Daughter Disposition: Anticipate home Status is: Inpatient  Remains inpatient appropriate because: He continues to require IV diuresis with Lasix and now on a continuous infusion   Consultants:  Cardiology (inpatient) [facility]   Procedures:  ABI pending  Antimicrobials:  N/a   Subjective: Abdomen continues to be distended but overall going down.  C/o numbness in right leg.    Objective: Vitals:   08/14/21 1732 08/14/21 2107 08/15/21 0443 08/15/21  0500  BP: (!) 143/76 109/61 116/72   Pulse: 94 91 (!) 58   Resp: 19 19 18    Temp: 98.3 F (36.8 C) 98.7 F (37.1 C) 98.8 F (37.1 C)   TempSrc:      SpO2: 100% 98% 94%   Weight:    99.8 kg  Height:        Intake/Output Summary (Last 24 hours) at  08/15/2021 1007 Last data filed at 08/15/2021 0900 Gross per 24 hour  Intake 753.01 ml  Output 4500 ml  Net -3746.99 ml   Filed Weights   08/13/21 0500 08/14/21 0519 08/15/21 0500  Weight: 105.9 kg 102.3 kg 99.8 kg    Examination:  General exam: frail elderly male, sitting up in chair, appears volume overloaded, Appears calm and comfortable  Respiratory system: BBS clear to auscultation.  Respiratory effort normal. Cardiovascular system: normal S1 & S2 heard. No JVD, murmurs, rubs, gallops or clicks. No pedal edema. Gastrointestinal system: Abdomen is distended, tympanitic, soft and nontender. No organomegaly or masses felt. Normal bowel sounds heard. Central nervous system: Alert and oriented. No focal neurological deficits. Extremities: trace edema in legs, pedal pulses 1+ bilateral.   Skin: No rashes, lesions or ulcers.  Psychiatry: Judgement and insight appear normal. Mood & affect appropriate.   Data Reviewed: I have personally reviewed following labs and imaging studies  CBC: Recent Labs  Lab 08/10/21 1036 08/12/21 0605 08/13/21 0554 08/14/21 0539  WBC 6.2 5.0 5.1 5.5  NEUTROABS 4.8  --   --   --   HGB 9.7* 9.1* 8.6* 9.0*  HCT 31.4* 29.3* 28.3* 29.0*  MCV 102.3* 100.0 99.6 98.6  PLT 287 270 258 809    Basic Metabolic Panel: Recent Labs  Lab 08/10/21 1036 08/11/21 0618 08/12/21 0605 08/13/21 0554 08/14/21 0539 08/15/21 0415  NA 136 138 137 135 136 136  K 4.7 4.9 4.5 4.5 4.4 4.2  CL 104 106 103 101 98 98  CO2 21* 24 24 25 29 29   GLUCOSE 91 91 83 89 85 84  BUN 70* 70* 68* 72* 70* 73*  CREATININE 2.86* 2.77* 2.51* 2.73* 2.65* 2.75*  CALCIUM 8.9 8.9 8.7* 8.7* 8.8* 8.7*  MG 2.2  --   --  2.1 2.0  --     GFR: Estimated Creatinine Clearance: 20.9 mL/min (A) (by C-G formula based on SCr of 2.75 mg/dL (H)).  Liver Function Tests: Recent Labs  Lab 08/10/21 1036 08/13/21 0554  AST 20 19  ALT 12 11  ALKPHOS 143* 126  BILITOT 0.7 0.7  PROT 8.1 6.9   ALBUMIN 3.0* 2.5*    CBG: No results for input(s): GLUCAP in the last 168 hours.  Recent Results (from the past 240 hour(s))  Resp Panel by RT-PCR (Flu A&B, Covid) Nasopharyngeal Swab     Status: None   Collection Time: 08/10/21 11:45 AM   Specimen: Nasopharyngeal Swab; Nasopharyngeal(NP) swabs in vial transport medium  Result Value Ref Range Status   SARS Coronavirus 2 by RT PCR NEGATIVE NEGATIVE Final    Comment: (NOTE) SARS-CoV-2 target nucleic acids are NOT DETECTED.  The SARS-CoV-2 RNA is generally detectable in upper respiratory specimens during the acute phase of infection. The lowest concentration of SARS-CoV-2 viral copies this assay can detect is 138 copies/mL. A negative result does not preclude SARS-Cov-2 infection and should not be used as the sole basis for treatment or other patient management decisions. A negative result may occur with  improper specimen collection/handling, submission of specimen other  than nasopharyngeal swab, presence of viral mutation(s) within the areas targeted by this assay, and inadequate number of viral copies(<138 copies/mL). A negative result must be combined with clinical observations, patient history, and epidemiological information. The expected result is Negative.  Fact Sheet for Patients:  EntrepreneurPulse.com.au  Fact Sheet for Healthcare Providers:  IncredibleEmployment.be  This test is no t yet approved or cleared by the [place] FDA and  has been authorized for detection and/or diagnosis of SARS-CoV-2 by FDA under an Emergency Use Authorization (EUA). This EUA will remain  in effect (meaning this test can be used) for the duration of the COVID-19 declaration under Section 564(b)(1) of the Act, 21 U.S.C.section 360bbb-3(b)(1), unless the authorization is terminated  or revoked sooner.       Influenza A by PCR NEGATIVE NEGATIVE Final   Influenza B by PCR NEGATIVE NEGATIVE Final     Comment: (NOTE) The Xpert Xpress SARS-CoV-2/FLU/RSV plus assay is intended as an aid in the diagnosis of influenza from Nasopharyngeal swab specimens and should not be used as a sole basis for treatment. Nasal washings and aspirates are unacceptable for Xpert Xpress SARS-CoV-2/FLU/RSV testing.  Fact Sheet for Patients: EntrepreneurPulse.com.au  Fact Sheet for Healthcare Providers: IncredibleEmployment.be  This test is not yet approved or cleared by the [place] FDA and has been authorized for detection and/or diagnosis of SARS-CoV-2 by FDA under an Emergency Use Authorization (EUA). This EUA will remain in effect (meaning this test can be used) for the duration of the COVID-19 declaration under Section 564(b)(1) of the Act, 21 U.S.C. section 360bbb-3(b)(1), unless the authorization is terminated or revoked.  Performed at [facility], [address], [place], [place] [address]      Radiology Studies: No results found.  Scheduled Meds:  apixaban  2.5 mg Oral BID   atorvastatin  40 mg Oral q1800   calcium-vitamin D  1 tablet Oral Daily   ferrous sulfate  325 mg Oral Q breakfast   folic acid  1 mg Oral Daily   nystatin   Topical TID   sodium chloride flush  3 mL Intravenous Q12H   timolol  1 drop Both Eyes BID   Continuous Infusions:  sodium chloride     furosemide (LASIX) 200 mg in dextrose 5% 100 mL (2mg /mL) infusion 6 mg/hr (08/15/21 0105)    LOS: 5 days   Time spent: 35 mins   Cattaleya Wien Wynetta Emery, MD How to contact the [facility] Attending or Consulting provider [place] or covering provider during after hours [place], for this patient?  Check the care team in [facility] and look for a) attending/consulting [facility] provider listed and b) the [facility] team listed Log into [email or web address] and use [place]'s universal password to access. If you do not have the password, please contact the hospital operator. Locate the [facility] provider you are looking for  under [facility] and page to a number that you can be directly reached. If you still have difficulty reaching the provider, please page the [facility] (Director on Call) for the Hospitalists listed on amion for assistance.  08/15/2021, 10:07 AM

## 2021-08-15 NOTE — Progress Notes (Signed)
Patient c/o right lower extremity pain and states " it doesn't feel like its getting circulation". Pedal pulse is +1 , he also states it hurts behind his knee, given Prn tylenol and notified Dr. Wynetta Emery

## 2021-08-15 NOTE — Plan of Care (Signed)

## 2021-08-16 ENCOUNTER — Inpatient Hospital Stay (HOSPITAL_COMMUNITY): Payer: Medicare (Managed Care)

## 2021-08-16 DIAGNOSIS — N184 Chronic kidney disease, stage 4 (severe): Secondary | ICD-10-CM | POA: Diagnosis not present

## 2021-08-16 DIAGNOSIS — I455 Other specified heart block: Secondary | ICD-10-CM | POA: Diagnosis not present

## 2021-08-16 DIAGNOSIS — I509 Heart failure, unspecified: Secondary | ICD-10-CM | POA: Diagnosis not present

## 2021-08-16 LAB — BASIC METABOLIC PANEL
Anion gap: 13 (ref 5–15)
BUN: 72 mg/dL — ABNORMAL HIGH (ref 8–23)
CO2: 26 mmol/L (ref 22–32)
Calcium: 8.5 mg/dL — ABNORMAL LOW (ref 8.9–10.3)
Chloride: 95 mmol/L — ABNORMAL LOW (ref 98–111)
Creatinine, Ser: 2.64 mg/dL — ABNORMAL HIGH (ref 0.61–1.24)
GFR, Estimated: 23 mL/min — ABNORMAL LOW (ref >60)
Glucose, Bld: 87 mg/dL (ref 70–99)
Potassium: 3.5 mmol/L (ref 3.5–5.1)
Sodium: 134 mmol/L — ABNORMAL LOW (ref 135–145)

## 2021-08-16 LAB — MAGNESIUM: Magnesium: 2 mg/dL (ref 1.7–2.4)

## 2021-08-16 MED ORDER — TORSEMIDE 20 MG PO TABS
40.0000 mg | ORAL_TABLET | Freq: Every day | ORAL | Status: DC
  Administered 2021-08-16: 09:00:00 40 mg via ORAL
  Filled 2021-08-16: qty 2

## 2021-08-16 MED ORDER — POTASSIUM CHLORIDE CRYS ER 10 MEQ PO TBCR
10.0000 meq | EXTENDED_RELEASE_TABLET | Freq: Two times a day (BID) | ORAL | Status: DC
  Administered 2021-08-16 – 2021-08-17 (×3): 10 meq via ORAL
  Filled 2021-08-16 (×3): qty 1

## 2021-08-16 NOTE — Plan of Care (Signed)

## 2021-08-16 NOTE — Progress Notes (Signed)
PROGRESS NOTE   Lawrence Harper  QQV:[id number] DOB: [birth year] DOA: 08/10/2021 PCP: System, Provider Not In   Chief Complaint  Patient presents with   Shortness of Breath   Level of care: Med-Surg  Brief Admission History:  87 y.o. male with medical history significant of paroxysmal A. fib, coronary artery disease, hypertension, obesity, chronic kidney disease, and diastolic heart failure.  Patient splits time between [facility] and [place].  Most of patient's primary care and specialist reside in [place], fortunately these records are available through epic for review.  Patient states that he has gained 40 to 50 pounds in the last 2 months.  Apparently patient had stopped taking his Lasix for a time period and was just resumed about a month ago his doctors in [place] have been encouraging him to come to the hospital for about 10 days.  Patient states that the swelling in his abdomen and his shortness of breath with exertion has continued to get worse prompting him to come to the ER.  He states his baseline weight is around 200 pounds.  Weight in the ER is 245 pounds.  Stay complicated with a pause of 4 seconds  Assessment & Plan:   Principal Problem:   Acute exacerbation of CHF (congestive heart failure) ([place]) Active Problems:   Hypertension   Atrial fibrillation (HCC)   CAD (coronary artery disease)   Chronic kidney disease   Goals of care, counseling/discussion   Acute respiratory failure (HCC)   Sinus pause   Abdominal distension   Hypoxia   Acute HFpEF / Right heart failure  -Patient responding favorably to IV Lasix infusion however remains very volume overloaded. -Cardiology team started IV Lasix infusion 08/12/2021.  I reduce to 4mg /hr 12/24 and transitioned to oral torsemide 40 mg daily on 12/25.  -Continue to monitor intake output Daily weights and electrolytes closely. -Patient now near baseline weight of 193 pounds from 04/2021 -Plan to discharge home 12/26 if we  can demonstrate adequate response to oral torsemide.  -K down to 3.5.  potassium supplement added 12/25.   Intake/Output Summary (Last 24 hours) at 08/16/2021 1127 Last data filed at 08/16/2021 3329 Gross per 24 hour  Intake 339.02 ml  Output 2000 ml  Net -1660.98 ml   Filed Weights   08/14/21 0519 08/15/21 0500 08/16/21 0500  Weight: 102.3 kg 99.8 kg 94.9 kg   Chronic atrial fibrillation -Lopressor 25 mg twice daily held due to pauses greater than 4 seconds -Cardiology team monitoring telemetry -Heart rate has remained stable and controlled on no AV nodal blocking agent -Apixaban 2.5 mg twice daily for full anticoagulation  Nocturnal pauses -Appreciate cardiology consultation recommendations -Hold Lopressor at this time -Hold all AV nodal blocking agents  Coronary artery disease -Status post NSTEMI in September 2014 with DES to LAD. -He is on statin therapy daily  Moderate aortic stenosis -Noted on recent 2D echocardiogram continue outpatient follow-up with cardiology service  Right leg numbness/pain - check ABI studies, (pending)   Stage IV CKD -Creatinine has remained stable with diuresis, monitoring  DNR present on admission -It was discussed with daughter who is agreeable to BiPAP temporarily if required  DVT prophylaxis: Apixaban Code Status: DNR Family Communication: Daughter Disposition: Anticipate home in 1-2 days Status is: Inpatient  Remains inpatient appropriate because: He continues to require IV diuresis with Lasix and now on a continuous infusion   Consultants:  Cardiology (inpatient) [facility]   Procedures:  ABI pending  Antimicrobials:  N/a   Subjective: Pt  reports right leg numbness has resolved, less SOB, still urinating frequently.    Objective: Vitals:   08/15/21 1400 08/15/21 1958 08/16/21 0323 08/16/21 0500  BP: 116/63 (!) 130/57 (!) 119/56   Pulse: (!) 53 69 72   Resp: 20 18 18    Temp: 98.5 F (36.9 C) 98.1 F (36.7 C) 98.1 F  (36.7 C)   TempSrc: Oral     SpO2: 94% 97% 96%   Weight:    94.9 kg  Height:        Intake/Output Summary (Last 24 hours) at 08/16/2021 1127 Last data filed at 08/16/2021 1245 Gross per 24 hour  Intake 339.02 ml  Output 2000 ml  Net -1660.98 ml   Filed Weights   08/14/21 0519 08/15/21 0500 08/16/21 0500  Weight: 102.3 kg 99.8 kg 94.9 kg    Examination:  General exam: frail elderly male, sitting up in chair, appears volume overloaded, Appears calm and comfortable.   Respiratory system: BBS clear to auscultation.  Respiratory effort normal. Cardiovascular system: normal S1 & S2 heard. No JVD, murmurs, rubs, gallops or clicks. No pedal edema. Gastrointestinal system: Abdomen is distended, tympanitic, soft and nontender. No organomegaly or masses felt. Normal bowel sounds heard. Central nervous system: Alert and oriented. No focal neurological deficits. Extremities: trace edema in legs, pedal pulses 1+ bilateral.   Skin: No rashes, lesions or ulcers.  Psychiatry: Judgement and insight appear normal. Mood & affect appropriate.   Data Reviewed: I have personally reviewed following labs and imaging studies  CBC: Recent Labs  Lab 08/10/21 1036 08/12/21 0605 08/13/21 0554 08/14/21 0539  WBC 6.2 5.0 5.1 5.5  NEUTROABS 4.8  --   --   --   HGB 9.7* 9.1* 8.6* 9.0*  HCT 31.4* 29.3* 28.3* 29.0*  MCV 102.3* 100.0 99.6 98.6  PLT 287 270 258 809    Basic Metabolic Panel: Recent Labs  Lab 08/10/21 1036 08/11/21 0618 08/12/21 0605 08/13/21 0554 08/14/21 0539 08/15/21 0415 08/16/21 0449  NA 136   < > 137 135 136 136 134*  K 4.7   < > 4.5 4.5 4.4 4.2 3.5  CL 104   < > 103 101 98 98 95*  CO2 21*   < > 24 25 29 29 26   GLUCOSE 91   < > 83 89 85 84 87  BUN 70*   < > 68* 72* 70* 73* 72*  CREATININE 2.86*   < > 2.51* 2.73* 2.65* 2.75* 2.64*  CALCIUM 8.9   < > 8.7* 8.7* 8.8* 8.7* 8.5*  MG 2.2  --   --  2.1 2.0  --  2.0   < > = values in this interval not displayed.     GFR: Estimated Creatinine Clearance: 21.2 mL/min (A) (by C-G formula based on SCr of 2.64 mg/dL (H)).  Liver Function Tests: Recent Labs  Lab 08/10/21 1036 08/13/21 0554  AST 20 19  ALT 12 11  ALKPHOS 143* 126  BILITOT 0.7 0.7  PROT 8.1 6.9  ALBUMIN 3.0* 2.5*    CBG: No results for input(s): GLUCAP in the last 168 hours.  Recent Results (from the past 240 hour(s))  Resp Panel by RT-PCR (Flu A&B, Covid) Nasopharyngeal Swab     Status: None   Collection Time: 08/10/21 11:45 AM   Specimen: Nasopharyngeal Swab; Nasopharyngeal(NP) swabs in vial transport medium  Result Value Ref Range Status   SARS Coronavirus 2 by RT PCR NEGATIVE NEGATIVE Final    Comment: (NOTE) SARS-CoV-2 target nucleic  acids are NOT DETECTED.  The SARS-CoV-2 RNA is generally detectable in upper respiratory specimens during the acute phase of infection. The lowest concentration of SARS-CoV-2 viral copies this assay can detect is 138 copies/mL. A negative result does not preclude SARS-Cov-2 infection and should not be used as the sole basis for treatment or other patient management decisions. A negative result may occur with  improper specimen collection/handling, submission of specimen other than nasopharyngeal swab, presence of viral mutation(s) within the areas targeted by this assay, and inadequate number of viral copies(<138 copies/mL). A negative result must be combined with clinical observations, patient history, and epidemiological information. The expected result is Negative.  Fact Sheet for Patients:  EntrepreneurPulse.com.au  Fact Sheet for Healthcare Providers:  IncredibleEmployment.be  This test is no t yet approved or cleared by the [place] FDA and  has been authorized for detection and/or diagnosis of SARS-CoV-2 by FDA under an Emergency Use Authorization (EUA). This EUA will remain  in effect (meaning this test can be used) for the duration  of the COVID-19 declaration under Section 564(b)(1) of the Act, 21 U.S.C.section 360bbb-3(b)(1), unless the authorization is terminated  or revoked sooner.       Influenza A by PCR NEGATIVE NEGATIVE Final   Influenza B by PCR NEGATIVE NEGATIVE Final    Comment: (NOTE) The Xpert Xpress SARS-CoV-2/FLU/RSV plus assay is intended as an aid in the diagnosis of influenza from Nasopharyngeal swab specimens and should not be used as a sole basis for treatment. Nasal washings and aspirates are unacceptable for Xpert Xpress SARS-CoV-2/FLU/RSV testing.  Fact Sheet for Patients: EntrepreneurPulse.com.au  Fact Sheet for Healthcare Providers: IncredibleEmployment.be  This test is not yet approved or cleared by the [place] FDA and has been authorized for detection and/or diagnosis of SARS-CoV-2 by FDA under an Emergency Use Authorization (EUA). This EUA will remain in effect (meaning this test can be used) for the duration of the COVID-19 declaration under Section 564(b)(1) of the Act, 21 U.S.C. section 360bbb-3(b)(1), unless the authorization is terminated or revoked.  Performed at [facility], [address], [place], [place] [address]      Radiology Studies: No results found.  Scheduled Meds:  apixaban  2.5 mg Oral BID   atorvastatin  40 mg Oral q1800   calcium-vitamin D  1 tablet Oral Daily   ferrous sulfate  325 mg Oral Q breakfast   folic acid  1 mg Oral Daily   nystatin   Topical TID   potassium chloride  10 mEq Oral BID   sodium chloride flush  3 mL Intravenous Q12H   timolol  1 drop Both Eyes BID   torsemide  40 mg Oral Daily   Continuous Infusions:  sodium chloride      LOS: 6 days   Time spent: 35 mins   Raynisha Avilla Wynetta Emery, MD How to contact the [facility] Attending or Consulting provider [place] or covering provider during after hours [place], for this patient?  Check the care team in [facility] and look for a) attending/consulting TRH  provider listed and b) the [facility] team listed Log into [email or web address] and use [place]'s universal password to access. If you do not have the password, please contact the hospital operator. Locate the [facility] provider you are looking for under [facility] and page to a number that you can be directly reached. If you still have difficulty reaching the provider, please page the [facility] (Director on Call) for the Hospitalists listed on amion for  assistance.  08/16/2021, 11:27 AM

## 2021-08-17 DIAGNOSIS — I4811 Longstanding persistent atrial fibrillation: Secondary | ICD-10-CM | POA: Diagnosis not present

## 2021-08-17 DIAGNOSIS — R140 Abdominal distension (gaseous): Secondary | ICD-10-CM | POA: Diagnosis not present

## 2021-08-17 DIAGNOSIS — N184 Chronic kidney disease, stage 4 (severe): Secondary | ICD-10-CM | POA: Diagnosis not present

## 2021-08-17 DIAGNOSIS — I5033 Acute on chronic diastolic (congestive) heart failure: Secondary | ICD-10-CM | POA: Diagnosis not present

## 2021-08-17 LAB — BASIC METABOLIC PANEL
Anion gap: 14 (ref 5–15)
BUN: 72 mg/dL — ABNORMAL HIGH (ref 8–23)
CO2: 24 mmol/L (ref 22–32)
Calcium: 8.7 mg/dL — ABNORMAL LOW (ref 8.9–10.3)
Chloride: 94 mmol/L — ABNORMAL LOW (ref 98–111)
Creatinine, Ser: 2.39 mg/dL — ABNORMAL HIGH (ref 0.61–1.24)
GFR, Estimated: 26 mL/min — ABNORMAL LOW (ref >60)
Glucose, Bld: 91 mg/dL (ref 70–99)
Potassium: 4 mmol/L (ref 3.5–5.1)
Sodium: 132 mmol/L — ABNORMAL LOW (ref 135–145)

## 2021-08-17 MED ORDER — NYSTATIN 100000 UNIT/GM EX POWD: Freq: Three times a day (TID) | CUTANEOUS | 0 refills | Status: AC

## 2021-08-17 MED ORDER — TORSEMIDE 20 MG PO TABS
30.0000 mg | ORAL_TABLET | Freq: Every day | ORAL | Status: DC
  Administered 2021-08-17: 11:00:00 30 mg via ORAL
  Filled 2021-08-17: qty 2

## 2021-08-17 MED ORDER — POTASSIUM CHLORIDE CRYS ER 10 MEQ PO TBCR: 10.0000 meq | EXTENDED_RELEASE_TABLET | ORAL | 1 refills | Status: AC

## 2021-08-17 MED ORDER — TORSEMIDE 10 MG PO TABS: 30.0000 mg | ORAL_TABLET | Freq: Every day | ORAL | 1 refills | Status: AC

## 2021-08-17 MED ORDER — POTASSIUM CHLORIDE CRYS ER 10 MEQ PO TBCR: 10.0000 meq | EXTENDED_RELEASE_TABLET | Freq: Every day | ORAL | Status: DC

## 2021-08-17 NOTE — Progress Notes (Signed)
Nsg Discharge Note  Admit Date:  08/10/2021 Discharge date: 08/17/2021   Sylvie Farrier to be D/C'd Home per MD order.  AVS completed.  Copy for chart, and copy for patient signed, and dated. Patient/caregiver able to verbalize understanding.  Discharge Medication: Allergies as of 08/17/2021   No Known Allergies      Medication List     STOP taking these medications    amLODipine 10 MG tablet Commonly known as: NORVASC   aspirin 81 MG chewable tablet   clopidogrel 75 MG tablet Commonly known as: PLAVIX   furosemide 40 MG tablet Commonly known as: LASIX   lisinopril 40 MG tablet Commonly known as: ZESTRIL   metoprolol tartrate 25 MG tablet Commonly known as: LOPRESSOR   Warfarin - Physician Dosing Inpatient Misc   warfarin 5 MG tablet Commonly known as: COUMADIN       TAKE these medications    acetaminophen 650 MG CR tablet Commonly known as: TYLENOL Take 1,300 mg by mouth every 8 (eight) hours as needed for pain.   atorvastatin 40 MG tablet Commonly known as: LIPITOR Take 40 mg by mouth daily.   Calcium 600 +D High Potency 600-10 MG-MCG Tabs Generic drug: Calcium Carbonate-Vitamin D Take 1 tablet by mouth daily.   Eliquis 5 MG Tabs tablet Generic drug: apixaban Take 2.5 mg by mouth 2 (two) times daily.   ferrous sulfate 325 (65 FE) MG tablet Take 325 mg by mouth daily with breakfast.   folic acid 1 MG tablet Commonly known as: FOLVITE Take 1 mg by mouth daily.   nitroGLYCERIN 0.4 MG SL tablet Commonly known as: NITROSTAT Place 1 tablet (0.4 mg total) under the tongue every 5 (five) minutes as needed for chest pain.   nystatin powder Commonly known as: MYCOSTATIN/NYSTOP Apply topically 3 (three) times daily.   potassium chloride 10 MEQ tablet Commonly known as: KLOR-CON M Take 1 tablet (10 mEq total) by mouth every other day. Start taking on: August 19, 2021   timolol 0.5 % ophthalmic solution Commonly known as: TIMOPTIC Place 1  drop into both eyes 2 (two) times daily.   torsemide 10 MG tablet Commonly known as: DEMADEX Take 3 tablets (30 mg total) by mouth daily. Start taking on: August 18, 2021        Discharge Assessment: Vitals:   08/16/21 2112 08/17/21 0312  BP: 100/65 (!) 103/57  Pulse: 80 80  Resp: 19 19  Temp: 98.1 F (36.7 C) 98.2 F (36.8 C)  SpO2: 99% 95%   Skin clean, dry and intact without evidence of skin break down, no evidence of skin tears noted. IV catheter discontinued intact. Site without signs and symptoms of complications - no redness or edema noted at insertion site, patient denies c/o pain - only slight tenderness at site.  Dressing with slight pressure applied.  D/c Instructions-Education: Discharge instructions given to patient/family with verbalized understanding. D/c education completed with patient/family including follow up instructions, medication list, d/c activities limitations if indicated, with other d/c instructions as indicated by MD - patient able to verbalize understanding, all questions fully answered. Patient instructed to return to ED, call 911, or call MD for any changes in condition.  Patient escorted via [place], and D/C home via private auto.  Dorcas Mcmurray, LPN [id number] 97:67 PM

## 2021-08-17 NOTE — TOC Transition Note (Addendum)
Transition of Care [facility]) - CM/SW Discharge Note   Patient Details  Name: Lawrence Harper MRN: [id number] Date of Birth: 02-21-1932  Transition of Care [facility]) CM/SW Contact:  Shade Flood, LCSW Phone Number: 08/17/2021, 11:11 AM   Clinical Narrative:     Pt stable for dc today per MD. Damaris Schooner with pt to review dc plans. Pt aware that TOC unable to find a [place] agency that can accept his insurance. He is agreeable to referral for outpatient PT and requests [place] [facility]. Referral made and information on AVS for pt's reference.  Pt states he is going to be staying in [place] permanently and his daughter is helping him get set up with a PCP, Cardiology, and new insurance.  Pt does not identify any other TOC needs for dc.  Final next level of care: Home/Self Care Barriers to Discharge: Barriers Resolved, No Home Care Agency will accept this patient   Patient Goals and CMS Choice Patient states their goals for this hospitalization and ongoing recovery are:: Home with [facility] CMS Medicare.gov Compare Post Acute Care list provided to:: Patient Choice offered to / list presented to : Patient  Discharge Placement                       Discharge Plan and Services In-house Referral: Clinical Social Work Discharge Planning Services: CM Consult Post Acute Care Choice: Home Health                               Social Determinants of Health (SDOH) Interventions     Readmission Risk Interventions Readmission Risk Prevention Plan 08/11/2021  Transportation Screening Complete  Home Care Screening Complete  Medication Review (RN CM) Complete  Some recent data might be hidden

## 2021-08-17 NOTE — Discharge Summary (Addendum)
Physician Discharge Summary  [name]:[id number] DOB: 13-Nov-1931 DOA: 08/10/2021  CARDIOLOGY: [facility]  Admit date: 08/10/2021 Discharge date: 08/17/2021  Admitted From:  HOME  Disposition: HOME   Recommendations for Outpatient Follow-up:  Follow up with Cardiology office in 1-2 weeks Please check weight, BMP in 1 week  Please follow up / establish care with PCP in 2-3 weeks   Discharge Condition: STABLE   CODE STATUS: DNR DIET: heart healthy low sodium    Brief Hospitalization Summary: Please see all hospital notes, images, labs for full details of the hospitalization. ADMISSION HPI:  Lawrence Harper is a 87 y.o. male with medical history significant of paroxysmal A. fib, coronary artery disease, hypertension, obesity, chronic kidney disease, and diastolic heart failure.  Patient splits time between [facility] and [place].  Most of patient's primary care and specialist reside in [place], fortunately these records are available through epic for review.  Patient states that he has gained 40 to 50 pounds in the last 2 months.  Apparently patient had stopped taking his Lasix for a time period and was just resumed about a month ago his doctors in [place] have been encouraging him to come to the hospital for about 10 days.  Patient states that the swelling in his abdomen and his shortness of breath with exertion has continued to get worse prompting him to come to the ER. He states his baseline weight is around 200 pounds.  Weight in the ER is 245 pounds.   In the ER he was found to be volume overloaded, requiring 2 to 3 L of oxygen.  He was given IV Lasix.  Hospitalist were consulted for admission for continued diuresis.  HOSPITAL COURSE BY PROBLEM LIST   Acute HFpEF / Right heart failure/massive volume overload  -Patient responding favorably to IV Lasix infusion and had diuresed over 16 L since admission.   -Cardiology team started IV Lasix infusion 08/12/2021.  I  reduce to 4mg /hr 12/24 and transitioned to oral torsemide 40 mg daily on 12/25.  He has diuresed an additional 2L since starting oral torsemide.  -Continue to monitor daily weights and electrolytes closely on an outpatient basis. -Patient now nearing baseline weight of 193 pounds from 04/2021 -Plan to discharge home 12/26 after we have been able to demonstrate adequate response to oral torsemide.  -potassium supplement added 12/25. To take 10 meQ every other day.   -weight down to 208#  -dc home with outpatient follow up with [facility] for labs, weight, med check in 7-10 days -Pt planning to establish a PCP in [place] in 2-3 weeks per what he is reporting to me now.   Intake/Output Summary (Last 24 hours) at 08/17/2021 1103 Last data filed at 08/17/2021 0900 Gross per 24 hour  Intake 604 ml  Output 2400 ml  Net -1796 ml   Filed Weights   08/16/21 0500 08/17/21 0500 08/17/21 0909  Weight: 94.9 kg 97.4 kg 94.5 kg   Chronic atrial fibrillation -Lopressor 25 mg twice daily discontinued due to pauses greater than 4 seconds -Cardiology team evaluated in hospital  -Heart rate has remained stable and controlled on NO AV nodal blocking agents -Apixaban 2.5 mg twice daily for full anticoagulation  Nocturnal pauses -Appreciate cardiology consultation recommendations -Hold Lopressor at this time -Hold all AV nodal blocking agents  Coronary artery disease -Status post NSTEMI in September 2014 with DES to LAD. -He is on statin therapy daily  Moderate aortic stenosis -Noted on recent 2D echocardiogram continue  outpatient follow-up with cardiology service  Right leg numbness/pain -  ABI studies reassuring and symptoms have resolved   Stage IV CKD -Creatinine has remained stable with diuresis, monitoring - follow up with [facility] in 7-10 days for repeat labs   DNR present on admission -It was discussed with daughter who is agreeable to BiPAP temporarily if required  DVT  prophylaxis: Apixaban Code Status: DNR Family Communication: Daughter Disposition: Home  Status is: Inpatient  Remains inpatient appropriate because: He continues to require IV diuresis with Lasix and now on a continuous infusion   Consultants:  Cardiology (inpatient) [facility]   Procedures:  ABI   Discharge Diagnoses:  Principal Problem:   Acute exacerbation of CHF (congestive heart failure) ([place]) Active Problems:   Hypertension   Atrial fibrillation ([place])   CAD (coronary artery disease)   Chronic kidney disease   Goals of care, counseling/discussion   Acute respiratory failure ([place])   Sinus pause   Abdominal distension   Hypoxia  Discharge Instructions: Discharge Instructions     Ambulatory referral to Cardiology   Complete by: As directed    Urgent hospital follow up needs labs and weight   Ambulatory referral to Physical Therapy   Complete by: As directed       Allergies as of 08/17/2021   No Known Allergies      Medication List     STOP taking these medications    amLODipine 10 MG tablet Commonly known as: NORVASC   aspirin 81 MG chewable tablet   clopidogrel 75 MG tablet Commonly known as: PLAVIX   furosemide 40 MG tablet Commonly known as: LASIX   lisinopril 40 MG tablet Commonly known as: ZESTRIL   metoprolol tartrate 25 MG tablet Commonly known as: LOPRESSOR   Warfarin - Physician Dosing Inpatient Misc   warfarin 5 MG tablet Commonly known as: COUMADIN       TAKE these medications    acetaminophen 650 MG CR tablet Commonly known as: TYLENOL Take 1,300 mg by mouth every 8 (eight) hours as needed for pain.   atorvastatin 40 MG tablet Commonly known as: LIPITOR Take 40 mg by mouth daily.   Calcium 600 +D High Potency 600-10 MG-MCG Tabs Generic drug: Calcium Carbonate-Vitamin D Take 1 tablet by mouth daily.   Eliquis 5 MG Tabs tablet Generic drug: apixaban Take 2.5 mg by mouth 2 (two) times daily.   ferrous sulfate 325 (65  FE) MG tablet Take 325 mg by mouth daily with breakfast.   folic acid 1 MG tablet Commonly known as: FOLVITE Take 1 mg by mouth daily.   nitroGLYCERIN 0.4 MG SL tablet Commonly known as: NITROSTAT Place 1 tablet (0.4 mg total) under the tongue every 5 (five) minutes as needed for chest pain.   nystatin powder Commonly known as: MYCOSTATIN/NYSTOP Apply topically 3 (three) times daily.   potassium chloride 10 MEQ tablet Commonly known as: KLOR-CON M Take 1 tablet (10 mEq total) by mouth every other day. Start taking on: August 19, 2021   timolol 0.5 % ophthalmic solution Commonly known as: TIMOPTIC Place 1 drop into both eyes 2 (two) times daily.   torsemide 10 MG tablet Commonly known as: DEMADEX Take 3 tablets (30 mg total) by mouth daily. Start taking on: August 18, 2021        Follow-up Information     [facility]. Schedule an appointment as soon as possible for a visit in 1 week(s).   Specialty: Cardiology Why: Hospital Follow Up  Contact information: [place] [phone]               No Known Allergies Allergies as of 08/17/2021   No Known Allergies      Medication List     STOP taking these medications    amLODipine 10 MG tablet Commonly known as: NORVASC   aspirin 81 MG chewable tablet   clopidogrel 75 MG tablet Commonly known as: PLAVIX   furosemide 40 MG tablet Commonly known as: LASIX   lisinopril 40 MG tablet Commonly known as: ZESTRIL   metoprolol tartrate 25 MG tablet Commonly known as: LOPRESSOR   Warfarin - Physician Dosing Inpatient Misc   warfarin 5 MG tablet Commonly known as: COUMADIN       TAKE these medications    acetaminophen 650 MG CR tablet Commonly known as: TYLENOL Take 1,300 mg by mouth every 8 (eight) hours as needed for pain.   atorvastatin 40 MG tablet Commonly known as: LIPITOR Take 40 mg by mouth daily.   Calcium 600 +D High Potency  600-10 MG-MCG Tabs Generic drug: Calcium Carbonate-Vitamin D Take 1 tablet by mouth daily.   Eliquis 5 MG Tabs tablet Generic drug: apixaban Take 2.5 mg by mouth 2 (two) times daily.   ferrous sulfate 325 (65 FE) MG tablet Take 325 mg by mouth daily with breakfast.   folic acid 1 MG tablet Commonly known as: FOLVITE Take 1 mg by mouth daily.   nitroGLYCERIN 0.4 MG SL tablet Commonly known as: NITROSTAT Place 1 tablet (0.4 mg total) under the tongue every 5 (five) minutes as needed for chest pain.   nystatin powder Commonly known as: MYCOSTATIN/NYSTOP Apply topically 3 (three) times daily.   potassium chloride 10 MEQ tablet Commonly known as: KLOR-CON M Take 1 tablet (10 mEq total) by mouth every other day. Start taking on: August 19, 2021   timolol 0.5 % ophthalmic solution Commonly known as: TIMOPTIC Place 1 drop into both eyes 2 (two) times daily.   torsemide 10 MG tablet Commonly known as: DEMADEX Take 3 tablets (30 mg total) by mouth daily. Start taking on: August 18, 2021        Procedures/Studies: DG Chest 2 View  Result Date: 08/10/2021 CLINICAL DATA:  87 year old male with a history of shortness of breath EXAM: CHEST - 2 VIEW COMPARISON:  None. FINDINGS: Cardiomediastinal silhouette enlarged with cardiomegaly. Calcifications of the aortic arch. Interlobular septal thickening. Low lung volumes. Opacity at the left lung base with obscuration of the left hemidiaphragm and the left heart border. Opacity at the posterior lung base on the lateral view. No pneumothorax.  No right-sided pleural effusion. Degenerative changes of the spine. IMPRESSION: Low lung volumes, with evidence acute CHF and left-sided pleural effusion. Cardiomegaly and aortic atherosclerosis. Electronically Signed   By: Corrie Mckusick D.O.   On: 08/10/2021 11:26   US Abdomen Complete  Result Date: 08/12/2021 CLINICAL DATA:  Abdominal distension.  Cholecystectomy. EXAM: ABDOMEN ULTRASOUND  COMPLETE COMPARISON:  None. FINDINGS: Gallbladder: Surgically absent. Common bile duct: Diameter: 6 mm Liver: No focal lesion identified. Increase in parenchymal echogenicity. Portal vein is patent on color Doppler imaging with normal direction of blood flow towards the liver. IVC: No abnormality visualized. Pancreas: Not well seen secondary to overlying bowel gas. Spleen: Size and appearance within normal limits. Right Kidney: Length: 13.5. Echogenicity is increased. There is a 12.6 x 8.4 x 8.0 cm cyst in the superior pole the right kidney. There is  a 4.0 by 3.5 x 3.3 cm cyst in the mid right kidney. No hydronephrosis. Left Kidney: Length: 14.1. Echogenicity is increased. No hydronephrosis. There is a mid renal cysts measuring 3.5 x 2.9 x 2.5 cm. There is a lower pole cyst measuring 2.3 x 2.4 x 2.8 cm. Abdominal aorta: No aneurysm visualized. Distal portion and bifurcation not well seen. Measured portion of the aorta 2.7 cm. Other findings: Small volume ascites. IMPRESSION: 1. Small volume ascites. 2. Bilateral echogenic kidneys worrisome for medical renal disease. 3. Bilateral renal cysts. Largest cyst is in the superior pole the right kidney measuring 12.6 cm. 4. Pancreas not well seen. 5. Cholecystectomy. 6. Echogenic liver suggestive of hepatocellular disease such as fatty infiltration. Electronically Signed   By: Ronney Asters M.D.   On: 08/12/2021 15:10   US ARTERIAL ABI (SCREENING LOWER EXTREMITY)  Result Date: 08/16/2021 CLINICAL DATA:  Lower extremity pain and numbness, diabetes, hypertension, coronary disease. EXAM: NONINVASIVE PHYSIOLOGIC VASCULAR STUDY OF BILATERAL LOWER EXTREMITIES TECHNIQUE: Evaluation of both lower extremities were performed at rest, including calculation of ankle-brachial indices with single level Doppler, pressure and pulse volume recording. COMPARISON:  None. FINDINGS: Right ABI:  0.96 Left ABI:  1.09 Right Lower Extremity: Monophasic waveforms at the right ankle. Right  dorsalis pedis vasculature is noncompressible. Left Lower Extremity: Monophasic waveforms at the left ankle. Left dorsalis pedis vasculature also noncompressible. 0.9-0.99 Borderline PAD IMPRESSION: Monophasic ankle flow bilaterally with noncompressible dorsalis pedis vasculature. This likely results in falsely elevated resting ABIs. Electronically Signed   By: Jerilynn Mages.  Shick M.D.   On: 08/16/2021 11:34   ECHOCARDIOGRAM COMPLETE  Result Date: 08/11/2021    ECHOCARDIOGRAM REPORT   Patient Name:   Lawrence Harper Date of Exam: 08/11/2021 Medical Rec #:  [id number]      Height:       66.0 in Accession #:    [id number]     Weight:       242.5 lb Date of Birth:  1931/10/23     BSA:          2.170 m Patient Age:    30 years       BP:           119/62 mmHg Patient Gender: M              HR:           71 bpm. Exam Location:  [facility] Procedure: 2D Echo, Cardiac Doppler, Color Doppler and Intracardiac            Opacification Agent Indications:    CHF  History:        Patient has no prior history of Echocardiogram examinations.                 CHF, Previous Myocardial Infarction and CAD, Arrythmias:Atrial                 Fibrillation; Risk Factors:Hypertension and Diabetes.  Sonographer:    Wenda Low Referring Phys: [place] Comments: Patient is morbidly obese. Image acquisition challenging due to patient body habitus. IMPRESSIONS  1. Left ventricular ejection fraction, by estimation, is 65 to 70%. The left ventricle has normal function. The left ventricle has no regional wall motion abnormalities. There is mild left ventricular hypertrophy. Left ventricular diastolic parameters are indeterminate.  2. Right ventricular systolic function is severely reduced. The right ventricular size is moderately enlarged. There is mildly elevated pulmonary artery systolic pressure.  3. Left  atrial size was mildly dilated.  4. Right atrial size was severely dilated.  5. Trivial mitral valve regurgitation.  6.  Tricuspid valve regurgitation is severe.  7. AV is thickened, calcified. Peak and mean gradients throught the valve are 33 and 17 mm Hg respectviely AVA (VTI) is 0.84 cm 2. Dimensionless index is 0.31 consistent with moderate AS. Marland Kitchen The aortic valve is tricuspid. Aortic valve regurgitation is not visualized.  8. The inferior vena cava is dilated in size with <50% respiratory variability, suggesting right atrial pressure of 15 mmHg. FINDINGS  Left Ventricle: Left ventricular ejection fraction, by estimation, is 65 to 70%. The left ventricle has normal function. The left ventricle has no regional wall motion abnormalities. Definity contrast agent was given IV to delineate the left ventricular  endocardial borders. The left ventricular internal cavity size was normal in size. There is mild left ventricular hypertrophy. Left ventricular diastolic parameters are indeterminate. Right Ventricle: The right ventricular size is moderately enlarged. Right vetricular wall thickness was not assessed. Right ventricular systolic function is severely reduced. There is mildly elevated pulmonary artery systolic pressure. The tricuspid regurgitant velocity is 2.61 m/s, and with an assumed right atrial pressure of 15 mmHg, the estimated right ventricular systolic pressure is 98.3 mmHg. Left Atrium: Left atrial size was mildly dilated. Right Atrium: Right atrial size was severely dilated. Pericardium: There is no evidence of pericardial effusion. Mitral Valve: There is mild thickening of the mitral valve leaflet(s). Mild mitral annular calcification. Trivial mitral valve regurgitation. MV peak gradient, 7.8 mmHg. The mean mitral valve gradient is 2.0 mmHg. Tricuspid Valve: The tricuspid valve is normal in structure. Tricuspid valve regurgitation is severe. Aortic Valve: AV is thickened, calcified. Peak and mean gradients throught the valve are 33 and 17 mm Hg respectviely AVA (VTI) is 0.84 cm 2. Dimensionless index is 0.31 consistent  with moderate AS. The aortic valve is tricuspid. Aortic valve regurgitation is not visualized. Aortic valve mean gradient measures 15.5 mmHg. Aortic valve peak gradient measures 31.8 mmHg. Aortic valve area, by VTI measures 0.89 cm. Pulmonic Valve: The pulmonic valve was grossly normal. Pulmonic valve regurgitation is mild. Aorta: The aortic root is normal in size and structure. Venous: The inferior vena cava is dilated in size with less than 50% respiratory variability, suggesting right atrial pressure of 15 mmHg. IAS/Shunts: No atrial level shunt detected by color flow Doppler.  LEFT VENTRICLE PLAX 2D LVIDd:         4.50 cm   Diastology LVIDs:         2.60 cm   LV e' medial:    9.36 cm/s LV PW:         1.30 cm   LV E/e' medial:  13.1 LV IVS:        1.00 cm   LV e' lateral:   10.90 cm/s LVOT diam:     1.90 cm   LV E/e' lateral: 11.3 LV SV:         66 LV SV Index:   30 LVOT Area:     2.84 cm  RIGHT VENTRICLE RV Basal diam:  5.50 cm RV S prime:     10.80 cm/s TAPSE (M-mode): 2.1 cm LEFT ATRIUM             Index        RIGHT ATRIUM           Index LA diam:        5.60 cm 2.58 cm/m   RA  Area:     40.00 cm LA Vol (A2C):   81.4 ml 37.50 ml/m  RA Volume:   166.00 ml 76.48 ml/m LA Vol (A4C):   77.3 ml 35.61 ml/m LA Biplane Vol: 81.6 ml 37.60 ml/m  AORTIC VALVE                     PULMONIC VALVE AV Area (Vmax):    0.90 cm      PV Vmax:       0.88 m/s AV Area (Vmean):   0.95 cm      PV Peak grad:  3.1 mmHg AV Area (VTI):     0.89 cm AV Vmax:           282.00 cm/s AV Vmean:          183.000 cm/s AV VTI:            0.740 m AV Peak Grad:      31.8 mmHg AV Mean Grad:      15.5 mmHg LVOT Vmax:         89.40 cm/s LVOT Vmean:        61.300 cm/s LVOT VTI:          0.233 m LVOT/AV VTI ratio: 0.31  AORTA Ao Root diam: 2.70 cm MITRAL VALVE                TRICUSPID VALVE MV Area (PHT): 2.93 cm     TR Peak grad:   27.2 mmHg MV Area VTI:   2.00 cm     TR Vmax:        261.00 cm/s MV Peak grad:  7.8 mmHg MV Mean grad:  2.0 mmHg      SHUNTS MV Vmax:       1.40 m/s     Systemic VTI:  0.23 m MV Vmean:      48.1 cm/s    Systemic Diam: 1.90 cm MV Decel Time: 259 msec MV E velocity: 123.00 cm/s Dorris Carnes MD Electronically signed by Dorris Carnes MD Signature Date/Time: 08/11/2021/5:32:59 PM    Final      Subjective: Pt reports overall he is feeling much better.  He has no SOB, CP or palpitations. His leg feels much better.   Discharge Exam: Vitals:   08/16/21 2112 08/17/21 0312  BP: 100/65 (!) 103/57  Pulse: 80 80  Resp: 19 19  Temp: 98.1 F (36.7 C) 98.2 F (36.8 C)  SpO2: 99% 95%   Vitals:   08/16/21 2112 08/17/21 0312 08/17/21 0500 08/17/21 0909  BP: 100/65 (!) 103/57    Pulse: 80 80    Resp: 19 19    Temp: 98.1 F (36.7 C) 98.2 F (36.8 C)    TempSrc: Oral Oral    SpO2: 99% 95%    Weight:   97.4 kg 94.5 kg  Height:       General exam: frail elderly male, sitting up in chair, appears volume overloaded, Appears calm and comfortable.   Respiratory system: BBS clear to auscultation.  Respiratory effort normal. Cardiovascular system: normal S1 & S2 heard. No JVD, murmurs, rubs, gallops or clicks. No pedal edema. Gastrointestinal system: Abdomen is much less distended,  soft and nontender. No organomegaly or masses felt. Normal bowel sounds heard. Central nervous system: Alert and oriented. No focal neurological deficits. Extremities: trace edema in legs, pedal pulses 1+ bilateral.   Skin: No rashes, lesions or ulcers.  Psychiatry: Judgement and insight appear normal. Mood &  affect appropriate.   The results of significant diagnostics from this hospitalization (including imaging, microbiology, ancillary and laboratory) are listed below for reference.     Microbiology: Recent Results (from the past 240 hour(s))  Resp Panel by RT-PCR (Flu A&B, Covid) Nasopharyngeal Swab     Status: None   Collection Time: 08/10/21 11:45 AM   Specimen: Nasopharyngeal Swab; Nasopharyngeal(NP) swabs in vial transport medium   Result Value Ref Range Status   SARS Coronavirus 2 by RT PCR NEGATIVE NEGATIVE Final    Comment: (NOTE) SARS-CoV-2 target nucleic acids are NOT DETECTED.  The SARS-CoV-2 RNA is generally detectable in upper respiratory specimens during the acute phase of infection. The lowest concentration of SARS-CoV-2 viral copies this assay can detect is 138 copies/mL. A negative result does not preclude SARS-Cov-2 infection and should not be used as the sole basis for treatment or other patient management decisions. A negative result may occur with  improper specimen collection/handling, submission of specimen other than nasopharyngeal swab, presence of viral mutation(s) within the areas targeted by this assay, and inadequate number of viral copies(<138 copies/mL). A negative result must be combined with clinical observations, patient history, and epidemiological information. The expected result is Negative.  Fact Sheet for Patients:  EntrepreneurPulse.com.au  Fact Sheet for Healthcare Providers:  IncredibleEmployment.be  This test is no t yet approved or cleared by the [place] FDA and  has been authorized for detection and/or diagnosis of SARS-CoV-2 by FDA under an Emergency Use Authorization (EUA). This EUA will remain  in effect (meaning this test can be used) for the duration of the COVID-19 declaration under Section 564(b)(1) of the Act, 21 U.S.C.section 360bbb-3(b)(1), unless the authorization is terminated  or revoked sooner.       Influenza A by PCR NEGATIVE NEGATIVE Final   Influenza B by PCR NEGATIVE NEGATIVE Final    Comment: (NOTE) The Xpert Xpress SARS-CoV-2/FLU/RSV plus assay is intended as an aid in the diagnosis of influenza from Nasopharyngeal swab specimens and should not be used as a sole basis for treatment. Nasal washings and aspirates are unacceptable for Xpert Xpress SARS-CoV-2/FLU/RSV testing.  Fact Sheet for  Patients: EntrepreneurPulse.com.au  Fact Sheet for Healthcare Providers: IncredibleEmployment.be  This test is not yet approved or cleared by the [place] FDA and has been authorized for detection and/or diagnosis of SARS-CoV-2 by FDA under an Emergency Use Authorization (EUA). This EUA will remain in effect (meaning this test can be used) for the duration of the COVID-19 declaration under Section 564(b)(1) of the Act, 21 U.S.C. section 360bbb-3(b)(1), unless the authorization is terminated or revoked.  Performed at [facility], [address], [place], [place] [address]      Labs: BNP (last 3 results) Recent Labs    08/10/21 1036  BNP 921.1*   Basic Metabolic Panel: Recent Labs  Lab 08/13/21 0554 08/14/21 0539 08/15/21 0415 08/16/21 0449 08/17/21 0450  NA 135 136 136 134* 132*  K 4.5 4.4 4.2 3.5 4.0  CL 101 98 98 95* 94*  CO2 25 29 29 26 24   GLUCOSE 89 85 84 87 91  BUN 72* 70* 73* 72* 72*  CREATININE 2.73* 2.65* 2.75* 2.64* 2.39*  CALCIUM 8.7* 8.8* 8.7* 8.5* 8.7*  MG 2.1 2.0  --  2.0  --    Liver Function Tests: Recent Labs  Lab 08/13/21 0554  AST 19  ALT 11  ALKPHOS 126  BILITOT 0.7  PROT 6.9  ALBUMIN 2.5*   No results for input(s): LIPASE,  AMYLASE in the last 168 hours. No results for input(s): AMMONIA in the last 168 hours. CBC: Recent Labs  Lab 08/12/21 0605 08/13/21 0554 08/14/21 0539  WBC 5.0 5.1 5.5  HGB 9.1* 8.6* 9.0*  HCT 29.3* 28.3* 29.0*  MCV 100.0 99.6 98.6  PLT 270 258 273   Cardiac Enzymes: No results for input(s): CKTOTAL, CKMB, CKMBINDEX, TROPONINI in the last 168 hours. BNP: Invalid input(s): POCBNP CBG: No results for input(s): GLUCAP in the last 168 hours. D-Dimer No results for input(s): DDIMER in the last 72 hours. Hgb A1c No results for input(s): HGBA1C in the last 72 hours. Lipid Profile No results for input(s): CHOL, HDL, LDLCALC, TRIG, CHOLHDL, LDLDIRECT in the last 72  hours. Thyroid function studies No results for input(s): TSH, T4TOTAL, T3FREE, THYROIDAB in the last 72 hours.  Invalid input(s): FREET3 Anemia work up No results for input(s): VITAMINB12, FOLATE, FERRITIN, TIBC, IRON, RETICCTPCT in the last 72 hours. Urinalysis No results found for: COLORURINE, APPEARANCEUR, [place], [place], GLUCOSEU, [place], [place], [place], PROTEINUR, UROBILINOGEN, NITRITE, LEUKOCYTESUR Sepsis Labs Invalid input(s): PROCALCITONIN,  WBC,  LACTICIDVEN Microbiology Recent Results (from the past 240 hour(s))  Resp Panel by RT-PCR (Flu A&B, Covid) Nasopharyngeal Swab     Status: None   Collection Time: 08/10/21 11:45 AM   Specimen: Nasopharyngeal Swab; Nasopharyngeal(NP) swabs in vial transport medium  Result Value Ref Range Status   SARS Coronavirus 2 by RT PCR NEGATIVE NEGATIVE Final    Comment: (NOTE) SARS-CoV-2 target nucleic acids are NOT DETECTED.  The SARS-CoV-2 RNA is generally detectable in upper respiratory specimens during the acute phase of infection. The lowest concentration of SARS-CoV-2 viral copies this assay can detect is 138 copies/mL. A negative result does not preclude SARS-Cov-2 infection and should not be used as the sole basis for treatment or other patient management decisions. A negative result may occur with  improper specimen collection/handling, submission of specimen other than nasopharyngeal swab, presence of viral mutation(s) within the areas targeted by this assay, and inadequate number of viral copies(<138 copies/mL). A negative result must be combined with clinical observations, patient history, and epidemiological information. The expected result is Negative.  Fact Sheet for Patients:  EntrepreneurPulse.com.au  Fact Sheet for Healthcare Providers:  IncredibleEmployment.be  This test is no t yet approved or cleared by the [place] FDA and  has been authorized for detection and/or  diagnosis of SARS-CoV-2 by FDA under an Emergency Use Authorization (EUA). This EUA will remain  in effect (meaning this test can be used) for the duration of the COVID-19 declaration under Section 564(b)(1) of the Act, 21 U.S.C.section 360bbb-3(b)(1), unless the authorization is terminated  or revoked sooner.       Influenza A by PCR NEGATIVE NEGATIVE Final   Influenza B by PCR NEGATIVE NEGATIVE Final    Comment: (NOTE) The Xpert Xpress SARS-CoV-2/FLU/RSV plus assay is intended as an aid in the diagnosis of influenza from Nasopharyngeal swab specimens and should not be used as a sole basis for treatment. Nasal washings and aspirates are unacceptable for Xpert Xpress SARS-CoV-2/FLU/RSV testing.  Fact Sheet for Patients: EntrepreneurPulse.com.au  Fact Sheet for Healthcare Providers: IncredibleEmployment.be  This test is not yet approved or cleared by the [place] FDA and has been authorized for detection and/or diagnosis of SARS-CoV-2 by FDA under an Emergency Use Authorization (EUA). This EUA will remain in effect (meaning this test can be used) for the duration of the COVID-19 declaration under Section 564(b)(1) of the Act, 21 U.S.C. section 360bbb-3(b)(1),  unless the authorization is terminated or revoked.  Performed at [facility], [address], [place], [place] [address]    Time coordinating discharge: 36 mins   SIGNED:  Irwin Brakeman, MD  [facility] 08/17/2021, 11:18 AM How to contact the [facility] Attending or Consulting provider [place] or covering provider during after hours [place], for this patient?  Check the care team in [facility] and look for a) attending/consulting TRH provider listed and b) the [facility] team listed Log into [email or web address] and use [place]'s universal password to access. If you do not have the password, please contact the hospital operator. Locate the [facility] provider you are looking for under [facility] and page to a number that you can be directly reached. If you still have difficulty reaching the provider, please page the [facility] (Director on Call) for the Hospitalists listed on amion for assistance.

## 2021-08-17 NOTE — Discharge Instructions (Signed)
IMPORTANT INFORMATION: PAY CLOSE ATTENTION   PHYSICIAN DISCHARGE INSTRUCTIONS  Follow with Primary care provider  System, Provider Not In  and other consultants as instructed by your Hospitalist Physician  [place] IF SYMPTOMS COME BACK, WORSEN OR NEW PROBLEM DEVELOPS   Please note: You were cared for by a hospitalist during your hospital stay. Every effort will be made to forward records to your primary care provider.  You can request that your primary care provider send for your hospital records if they have not received them.  Once you are discharged, your primary care physician will handle any further medical issues. Please note that NO REFILLS for any discharge medications will be authorized once you are discharged, as it is imperative that you return to your primary care physician (or establish a relationship with a primary care physician if you do not have one) for your post hospital discharge needs so that they can reassess your need for medications and monitor your lab values.  Please get a complete blood count and chemistry panel checked by your Primary MD at your next visit, and again as instructed by your Primary MD.  Get Medicines reviewed and adjusted: Please take all your medications with you for your next visit with your Primary MD  Laboratory/radiological data: Please request your Primary MD to go over all hospital tests and procedure/radiological results at the follow up, please ask your primary care provider to get all Hospital records sent to his/her office.  In some cases, they will be blood work, cultures and biopsy results pending at the time of your discharge. Please request that your primary care provider follow up on these results.  If you are diabetic, please bring your blood sugar readings with you to your follow up appointment with primary care.    Please call and make your follow up appointments as soon as possible.    Also  Note the following: If you experience worsening of your admission symptoms, develop shortness of breath, life threatening emergency, suicidal or homicidal thoughts you must seek medical attention immediately by calling 911 or calling your MD immediately  if symptoms less severe.  You must read complete instructions/literature along with all the possible adverse reactions/side effects for all the Medicines you take and that have been prescribed to you. Take any new Medicines after you have completely understood and accpet all the possible adverse reactions/side effects.   Do not drive when taking Pain medications or sleeping medications (Benzodiazepines)  Do not take more than prescribed Pain, Sleep and Anxiety Medications. It is not advisable to combine anxiety,sleep and pain medications without talking with your primary care practitioner  Special Instructions: If you have smoked or chewed Tobacco  in the last 2 yrs please stop smoking, stop any regular Alcohol  and or any Recreational drug use.  Wear Seat belts while driving.  Do not drive if taking any narcotic, mind altering or controlled substances or recreational drugs or alcohol.

## 2021-08-26 ENCOUNTER — Other Ambulatory Visit: Payer: Self-pay

## 2021-08-26 ENCOUNTER — Encounter: Payer: Self-pay | Admitting: Internal Medicine

## 2021-08-26 ENCOUNTER — Ambulatory Visit (INDEPENDENT_AMBULATORY_CARE_PROVIDER_SITE_OTHER): Payer: Medicare (Managed Care) | Admitting: Internal Medicine

## 2021-08-26 VITALS — BP 128/60 | HR 85 | Ht 66.0 in | Wt 204.6 lb

## 2021-08-26 DIAGNOSIS — Z79899 Other long term (current) drug therapy: Secondary | ICD-10-CM

## 2021-08-26 DIAGNOSIS — I4891 Unspecified atrial fibrillation: Secondary | ICD-10-CM

## 2021-08-26 NOTE — Progress Notes (Signed)
Cardiology Office Note:    Date:  08/26/2021   ID:  Lawrence Harper, DOB [birth year], MRN [id number]  PCP:  System, Provider Not In   [place] Providers Cardiologist:  None     Referring MD: Murlean Iba, MD   No chief complaint on file. Hospital Follow up  History of Present Illness:   Per hospital HPI and hospital course 12/19-12/26  Lawrence Harper is a 88 y.o. male with medical history significant of paroxysmal A. fib, coronary artery disease, hypertension, obesity, chronic kidney disease, and diastolic heart failure.  Patient splits time between [facility] and [place].  Most of patient's primary care and specialist reside in [place], fortunately these records are available through epic for review.  Patient states that he has gained 40 to 50 pounds in the last 2 months.  Apparently patient had stopped taking his Lasix for a time period and was just resumed about a month ago his doctors in [place] have been encouraging him to come to the hospital for about 10 days.  Patient states that the swelling in his abdomen and his shortness of breath with exertion has continued to get worse prompting him to come to the ER. He states his baseline weight is around 200 pounds.  Weight in the ER is 245 pounds. Patient was managed with IV diuresis.  HOSPITAL COURSE BY PROBLEM LIST    Acute HFpEF / Right heart failure/massive volume overload  -Patient responding favorably to IV Lasix infusion and had diuresed over 16 L since admission.   -Cardiology team started IV Lasix infusion 08/12/2021.  I reduce to 4mg /hr 12/24 and transitioned to oral torsemide 40 mg daily on 12/25.  He has diuresed an additional 2L since starting oral torsemide.  -Continue to monitor daily weights and electrolytes closely on an outpatient basis. -Patient now nearing baseline weight of 193 pounds from 04/2021 -Plan to discharge home 12/26 after we have been able to demonstrate adequate response to oral  torsemide.  -potassium supplement added 12/25. To take 10 meQ every other day.   -weight down to 208 -dc home with outpatient follow up with [facility] [place] for labs, weight, med check in 7-10 days -Pt planning to establish a PCP in [place] in 2-3 weeks per what he is reporting to me now.   Discharge weight 94.5 kg 12/26  Chronic atrial fibrillation -Lopressor 25 mg twice daily discontinued due to pauses greater than 4 seconds -Cardiology team evaluated in hospital  -Heart rate has remained stable and controlled on NO AV nodal blocking agents -Apixaban 2.5 mg twice daily for full anticoagulation  Coronary artery disease -Status post NSTEMI in September 2014 with DES to LAD. 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 He decided to live here. Creatinine 2.39 on discharge. He weighs himself each day and he was 196 pounds at home. He is 92 kg/204 pounds here in the office. This was his first hospital admission for VO. He can lie flat at home and breath well. He denies LE edema. 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 He is taking  his medications well. He is staying away from salt and improving his diet. He denies chest pain or shortness of breath. He's had atrial fibrillation for 40 years. He was on metoprolol but stopped 2/2 pauses. He denies any palpitations.    Cardiology Studies TTE 08/11/2021  1. Left ventricular ejection fraction, by estimation, is 65 to 70%. The  left ventricle has normal function. The left ventricle has no regional  wall motion abnormalities. There is mild left ventricular hypertrophy.  Left ventricular diastolic parameters   are indeterminate.   2. Right ventricular systolic function is severely reduced. The right  ventricular size is moderately enlarged. There is mildly elevated  pulmonary artery systolic pressure.   3. Left atrial size was mildly dilated.   4. Right atrial size was severely dilated.   5. Trivial mitral valve regurgitation.   6. Tricuspid valve regurgitation is severe.   7. AV is thickened, calcified. Peak and mean gradients throught the valve  are 33 and 17 mm Hg respectviely AVA (VTI) is 0.84 cm 2. Dimensionless  index is 0.31 consistent with moderate AS. Marland Kitchen The aortic valve is  tricuspid. Aortic valve regurgitation is  not visualized.   8. The inferior vena cava is dilated in size with <50% respiratory  variability, suggesting right atrial pressure of 15 mmHg.   Past Medical History:  Diagnosis Date   (HFpEF) heart failure with preserved ejection fraction ([place])    Echocardiogram 03/16/16 [facility] in [place]): EF 55, normal RVSF, mod to severe TR, mild AS (mean 10 mmHg), trace AI, mild PI, mild MR, small secundum ASD w L-R shunt, RVSP 40   Arthritis    Atrial fibrillation (HCC)    Cancer (HCC)    Chronic kidney disease    Coronary artery disease    NSTEMI 9/14 >> Cath: LM ok, pLAD 50, mLAD 95, pD1 80; LCx/RI/OM1/OM2/LPDA/RCA patent >> PCI: 2.5 x 24 mm Promus DES to mLAD   Diabetes mellitus without complication (HCC)    Diverticulitis    GERD (gastroesophageal reflux disease)    High cholesterol    History of non-ST elevation myocardial infarction in 2014 s/p DES to LAD    Hypertension    Peripheral vascular disease ([place])     Past Surgical History:  Procedure Laterality Date   CHOLECYSTECTOMY     CORONARY ANGIOPLASTY     EYE SURGERY     KNEE SURGERY     KNEE SURGERY     LEFT HEART CATH N/A 05/12/2013   Procedure: LEFT HEART CATH;  Surgeon: Jettie Booze, MD;  Location: [facility] CATH LAB;  Service: Cardiovascular;  Laterality: N/A;    Current Medications: Current Meds   Medication Sig   acetaminophen (TYLENOL) 650 MG CR tablet Take 1,300 mg by mouth every 8 (eight) hours as needed for pain.   atorvastatin (LIPITOR) 40 MG tablet Take 40 mg by mouth daily.   Calcium Carbonate-Vitamin D (CALCIUM 600 +D HIGH POTENCY) 600-10 MG-MCG TABS Take 1 tablet by mouth daily.   ELIQUIS 5 MG TABS tablet Take 2.5 mg by mouth 2 (two) times daily.   ferrous sulfate 325 (65 FE) MG tablet Take 325 mg by mouth daily with breakfast.   folic acid (FOLVITE) 1 MG tablet Take 1 mg by mouth daily.   nitroGLYCERIN (NITROSTAT) 0.4 MG SL tablet Place 1 tablet (0.4 mg total) under the tongue every 5 (five) minutes as needed for chest pain.   nystatin (MYCOSTATIN/NYSTOP) powder Apply topically 3 (three) times daily.  potassium chloride (KLOR-CON M) 10 MEQ tablet Take 1 tablet (10 mEq total) by mouth every other day.   timolol (TIMOPTIC) 0.5 % ophthalmic solution Place 1 drop into both eyes 2 (two) times daily.   torsemide (DEMADEX) 10 MG tablet Take 3 tablets (30 mg total) by mouth daily.     Allergies:   Patient has no known allergies.   Social History   Socioeconomic History   Marital status: Married    Spouse name: Not on file   Number of children: Not on file   Years of education: Not on file   Highest education level: Not on file  Occupational History   Not on file  Tobacco Use   Smoking status: Never   Smokeless tobacco: Never  Vaping Use   Vaping Use: Never used  Substance and Sexual Activity   Alcohol use: No   Drug use: No   Sexual activity: Not on file  Other Topics Concern   Not on file  Social History Narrative   Not on file   Social Determinants of Health   Financial Resource Strain: Not on file  Food Insecurity: Not on file  Transportation Needs: Not on file  Physical Activity: Not on file  Stress: Not on file  Social Connections: Not on file     Family History: The patient's family history includes Throat cancer (age of onset: 54) in his  mother.  ROS:   Please see the history of present illness.     All other systems reviewed and are negative.  EKGs/Labs/Other Studies Reviewed:    The following studies were reviewed today:   EKG:  EKG is  ordered today.  The ekg ordered today demonstrates   Atrial Fibrillation, RBBB  Recent Labs: 08/10/2021: B Natriuretic Peptide 522.0 08/13/2021: ALT 11 08/14/2021: Hemoglobin 9.0; Platelets 273 08/16/2021: Magnesium 2.0 08/17/2021: BUN 72; Creatinine, Ser 2.39; Potassium 4.0; Sodium 132  Recent Lipid Panel    Component Value Date/Time   CHOL 115 05/13/2013 0435   TRIG 124 05/13/2013 0435   HDL 42 05/13/2013 0435   CHOLHDL 2.7 05/13/2013 0435   VLDL 25 05/13/2013 0435   LDLCALC 48 05/13/2013 0435      Risk Assessment/Calculations:    CHA2DS2-VASc Score = 6   This indicates a 9.7% annual risk of stroke. The patient's score is based upon: CHF History: 1 HTN History: 1 Diabetes History: 1 Stroke History: 0 Vascular Disease History: 1 Age Score: 2 Gender Score: 0          Physical Exam:    VS:  Vitals:   08/26/21 0746  BP: 128/60  Pulse: 85  SpO2: 97%     Wt Readings from Last 3 Encounters:  08/26/21 204 lb 9.6 oz (92.8 kg)  08/17/21 208 lb 6.4 oz (94.5 kg)  05/25/13 275 lb (124.7 kg)     Physical Exam Gen: well appearing Neuro: alert and oriented CV: r,r,r  3/6 SEM RUSB, mild JVD Vasc: 2+ radial pulses Pulm: nl wob, CLAB Abd: non distended Ext: No LE edema Skin: warm and well perfused Psych: normal mood   ASSESSMENT:    #HfpeF/RV dysfunction: NYHA Class II. He had significant RV dilation and failure with preserved LV function. PASP 41 mmhg c/f group II. He was diuresed 16 L.  BNP 522. He had no signs of ischemia. He is euvolemic today. Weight 92.8 kg. He is maintaining a low sodium diet.  -continue torsemide 30 mg daily -can discuss SGLT2 on FU -continue daily  weights -continue fluid and salt restrictions -repeating BMET  today  #Ischemic Heart Dx: s/p NSTEMI 04/2013 s/p DES to the LAD. Has a cardiologist in [place] he follows. [facility] in [place] lipid panel 05/11/2021 showed HDL 33, LDL 24 ( at goal) - continue atorvastatin 40 mg  #Chronic AF: Chads2vasc 6, No hx of stroke. He is not on AV nodal blocking 2/2 4 second pauses. Will continue off BB. He is rate controlled. Will continue eliquis 5 mg BID.  #Moderate AS: Stable. Was mild in 2017 and progressed. Mean gradient 17 mmhg. AVA <1. Dimensionless index of 0.31. No changes.  #CKD Stage IV: stable. Has anemia of chronic disease.   PLAN:    In order of problems listed above:  BMET , Mg Follow up in 3 months-patient preference to FU in [place]         Medication Adjustments/Labs and Tests Ordered: Current medicines are reviewed at length with the patient today.  Concerns regarding medicines are outlined above.  Orders Placed This Encounter  Procedures   Basic metabolic panel   Magnesium   EKG 12-Lead   No orders of the defined types were placed in this encounter.   Patient Instructions  Medication Instructions:  No Changes In Medications at this time.  *If you need a refill on your cardiac medications before your next appointment, please call your pharmacy*  Lab Work: LAB [facility] If you have labs (blood work) drawn today and your tests are completely normal, you will receive your results only by: [place] (if you have MyChart) OR A paper copy in the mail If you have any lab test that is abnormal or we need to change your treatment, we will call you to review the results.  Follow-Up: At [facility], you and your health needs are our priority.  As part of our continuing mission to provide you with exceptional heart care, we have created designated Provider Care Teams.  These Care Teams include your primary Cardiologist (physician) and Advanced Practice Providers (APPs -  Physician Assistants and Nurse Practitioners)  who all work together to provide you with the care you need, when you need it.  Your next appointment:   3 month(s)  The format for your next appointment:   In Person  Provider:   PATIENT PREFERS TO FOLLOW UP IN [place] OFFICE}     Signed, Janina Mayo, MD  08/26/2021 5:07 PM    [place]

## 2021-08-26 NOTE — Patient Instructions (Signed)
Medication Instructions:  No Changes In Medications at this time.  *If you need a refill on your cardiac medications before your next appointment, please call your pharmacy*  Lab Work: LAB [facility] If you have labs (blood work) drawn today and your tests are completely normal, you will receive your results only by: [place] (if you have MyChart) OR A paper copy in the mail If you have any lab test that is abnormal or we need to change your treatment, we will call you to review the results.  Follow-Up: At [facility], you and your health needs are our priority.  As part of our continuing mission to provide you with exceptional heart care, we have created designated Provider Care Teams.  These Care Teams include your primary Cardiologist (physician) and Advanced Practice Providers (APPs -  Physician Assistants and Nurse Practitioners) who all work together to provide you with the care you need, when you need it.  Your next appointment:   3 month(s)  The format for your next appointment:   In Person  Provider:   PATIENT PREFERS TO FOLLOW UP IN [place] OFFICE}

## 2021-08-27 LAB — BASIC METABOLIC PANEL
BUN/Creatinine Ratio: 26 — ABNORMAL HIGH (ref 10–24)
BUN: 48 mg/dL — ABNORMAL HIGH (ref 8–27)
CO2: 21 mmol/L (ref 20–29)
Calcium: 9.4 mg/dL (ref 8.6–10.2)
Chloride: 98 mmol/L (ref 96–106)
Creatinine, Ser: 1.88 mg/dL — ABNORMAL HIGH (ref 0.76–1.27)
Glucose: 79 mg/dL (ref 70–99)
Potassium: 5.2 mmol/L (ref 3.5–5.2)
Sodium: 134 mmol/L (ref 134–144)
eGFR: 34 mL/min/{1.73_m2} — ABNORMAL LOW (ref >59)

## 2021-08-27 LAB — MAGNESIUM: Magnesium: 2.1 mg/dL (ref 1.6–2.3)

## 2022-01-04 ENCOUNTER — Encounter (HOSPITAL_COMMUNITY): Payer: Self-pay | Admitting: *Deleted

## 2022-01-04 ENCOUNTER — Emergency Department (HOSPITAL_COMMUNITY): Payer: Medicare (Managed Care)

## 2022-01-04 ENCOUNTER — Other Ambulatory Visit: Payer: Self-pay

## 2022-01-04 ENCOUNTER — Emergency Department (HOSPITAL_COMMUNITY)
Admission: EM | Admit: 2022-01-04 | Discharge: 2022-01-04 | Disposition: A | Discharge status: Home / Self Care | Payer: Medicare (Managed Care) | Attending: Emergency Medicine | Admitting: Emergency Medicine

## 2022-01-04 DIAGNOSIS — N189 Chronic kidney disease, unspecified: Secondary | ICD-10-CM | POA: Diagnosis not present

## 2022-01-04 DIAGNOSIS — E1122 Type 2 diabetes mellitus with diabetic chronic kidney disease: Secondary | ICD-10-CM | POA: Diagnosis not present

## 2022-01-04 DIAGNOSIS — S61411A Laceration without foreign body of right hand, initial encounter: Secondary | ICD-10-CM | POA: Insufficient documentation

## 2022-01-04 DIAGNOSIS — W01198A Fall on same level from slipping, tripping and stumbling with subsequent striking against other object, initial encounter: Secondary | ICD-10-CM | POA: Insufficient documentation

## 2022-01-04 DIAGNOSIS — S81012A Laceration without foreign body, left knee, initial encounter: Secondary | ICD-10-CM | POA: Insufficient documentation

## 2022-01-04 DIAGNOSIS — S6991XA Unspecified injury of right wrist, hand and finger(s), initial encounter: Secondary | ICD-10-CM | POA: Diagnosis present

## 2022-01-04 DIAGNOSIS — I509 Heart failure, unspecified: Secondary | ICD-10-CM | POA: Insufficient documentation

## 2022-01-04 DIAGNOSIS — I130 Hypertensive heart and chronic kidney disease with heart failure and stage 1 through stage 4 chronic kidney disease, or unspecified chronic kidney disease: Secondary | ICD-10-CM | POA: Diagnosis not present

## 2022-01-04 DIAGNOSIS — I2510 Atherosclerotic heart disease of native coronary artery without angina pectoris: Secondary | ICD-10-CM | POA: Diagnosis not present

## 2022-01-04 DIAGNOSIS — S0990XA Unspecified injury of head, initial encounter: Secondary | ICD-10-CM | POA: Diagnosis not present

## 2022-01-04 DIAGNOSIS — W19XXXA Unspecified fall, initial encounter: Secondary | ICD-10-CM

## 2022-01-04 LAB — CBC WITH DIFFERENTIAL/PLATELET
Abs Immature Granulocytes: 0.02 10*3/uL (ref 0.00–0.07)
Basophils Absolute: 0.1 10*3/uL (ref 0.0–0.1)
Basophils Relative: 1 %
Eosinophils Absolute: 0.1 10*3/uL (ref 0.0–0.5)
Eosinophils Relative: 2 %
HCT: 31.1 % — ABNORMAL LOW (ref 39.0–52.0)
Hemoglobin: 9.7 g/dL — ABNORMAL LOW (ref 13.0–17.0)
Immature Granulocytes: 0 %
Lymphocytes Relative: 9 %
Lymphs Abs: 0.5 10*3/uL — ABNORMAL LOW (ref 0.7–4.0)
MCH: 31.4 pg (ref 26.0–34.0)
MCHC: 31.2 g/dL (ref 30.0–36.0)
MCV: 100.6 fL — ABNORMAL HIGH (ref 80.0–100.0)
Monocytes Absolute: 0.4 10*3/uL (ref 0.1–1.0)
Monocytes Relative: 7 %
Neutro Abs: 5 10*3/uL (ref 1.7–7.7)
Neutrophils Relative %: 81 %
Platelets: 221 10*3/uL (ref 150–400)
RBC: 3.09 MIL/uL — ABNORMAL LOW (ref 4.22–5.81)
RDW: 17.3 % — ABNORMAL HIGH (ref 11.5–15.5)
WBC: 6.2 10*3/uL (ref 4.0–10.5)
nRBC: 0 % (ref 0.0–0.2)

## 2022-01-04 LAB — BASIC METABOLIC PANEL
Anion gap: 5 (ref 5–15)
BUN: 64 mg/dL — ABNORMAL HIGH (ref 8–23)
CO2: 25 mmol/L (ref 22–32)
Calcium: 8.6 mg/dL — ABNORMAL LOW (ref 8.9–10.3)
Chloride: 106 mmol/L (ref 98–111)
Creatinine, Ser: 2.25 mg/dL — ABNORMAL HIGH (ref 0.61–1.24)
GFR, Estimated: 28 mL/min — ABNORMAL LOW (ref >60)
Glucose, Bld: 145 mg/dL — ABNORMAL HIGH (ref 70–99)
Potassium: 4.1 mmol/L (ref 3.5–5.1)
Sodium: 136 mmol/L (ref 135–145)

## 2022-01-04 MED ORDER — DOXYCYCLINE HYCLATE 100 MG PO TABS
100.0000 mg | ORAL_TABLET | Freq: Once | ORAL | Status: DC
  Filled 2022-01-04: qty 1

## 2022-01-04 MED ORDER — LIDOCAINE HCL (PF) 1 % IJ SOLN
30.0000 mL | Freq: Once | INTRAMUSCULAR | Status: AC
  Administered 2022-01-04: 30 mL
  Filled 2022-01-04: qty 30

## 2022-01-04 MED ORDER — DOXYCYCLINE HYCLATE 100 MG PO CAPS
100.0000 mg | ORAL_CAPSULE | Freq: Two times a day (BID) | ORAL | 0 refills | Status: DC
Start: 2022-01-04 — End: 2022-05-03

## 2022-01-04 MED ORDER — SODIUM CHLORIDE 0.9 % IV BOLUS
500.0000 mL | Freq: Once | INTRAVENOUS | Status: AC
Start: 2022-01-04 — End: 2022-01-04
  Administered 2022-01-04: 500 mL via INTRAVENOUS

## 2022-01-04 NOTE — ED Provider Notes (Signed)
[facility] EMERGENCY DEPARTMENT Provider Note   CSN: [id number] Arrival date & time: 01/04/22  1032     History Chief Complaint  Patient presents with   Lawrence Harper    EDRIK RUNDLE is a 88 y.o. male with h/o CHF, atrial fibrillation on Eliquis, CAD, CKD, diabetes, hypertension presents the emergency department via EMS for a mechanical fall at home.  Patient was walking out to his golf cart when he stepped on the on the Velcro strap of his shoe and fell hitting his head on a golf cart hitch.  He denies any LOC or any blurry vision.  He reports his head does hurt some, otherwise it is mainly his left knee that hurts in addition to his left elbow and right hand.  He reports he is compliant with his blood pressure medications.  He denies any chest pain or any shortness of breath.  Denies any abdominal pain, nausea, or vomiting.  Denies any lightheadedness, back pain, or neck pain.  No known drug allergies.   Fall Pertinent negatives include no chest pain, no abdominal pain, no headaches and no shortness of breath.      Home Medications Prior to Admission medications   Medication Sig Start Date End Date Taking? Authorizing Provider  acetaminophen (TYLENOL) 650 MG CR tablet Take 1,300 mg by mouth every 8 (eight) hours as needed for pain.   Yes [provider]  atorvastatin (LIPITOR) 40 MG tablet Take 40 mg by mouth daily. 07/22/21  Yes [provider]  Calcium Carbonate-Vitamin D (CALCIUM 600 +D HIGH POTENCY) 600-10 MG-MCG TABS Take 1 tablet by mouth daily.   Yes [provider]  ELIQUIS 5 MG TABS tablet Take 2.5 mg by mouth 2 (two) times daily. 06/10/21  Yes [provider]  ferrous sulfate 325 (65 FE) MG tablet Take 325 mg by mouth daily with breakfast.   Yes [provider]  folic acid (FOLVITE) 1 MG tablet Take 1 mg by mouth daily.   Yes [provider]  nitroGLYCERIN (NITROSTAT) 0.4 MG SL tablet Place 1 tablet (0.4 mg total) under the  tongue every 5 (five) minutes as needed for chest pain. 05/13/13  Yes Jerline Pain, MD  nystatin (MYCOSTATIN/NYSTOP) powder Apply topically 3 (three) times daily. 08/17/21  Yes Johnson, Clanford L, MD  potassium chloride (KLOR-CON M) 10 MEQ tablet Take 1 tablet (10 mEq total) by mouth every other day. 08/19/21  Yes Johnson, Clanford L, MD  timolol (TIMOPTIC) 0.5 % ophthalmic solution Place 1 drop into both eyes 2 (two) times daily. 07/22/21  Yes [provider]  torsemide (DEMADEX) 10 MG tablet Take 3 tablets (30 mg total) by mouth daily. 08/18/21  Yes Murlean Iba, MD      Allergies    Patient has no known allergies.    Review of Systems   Review of Systems  Respiratory:  Negative for shortness of breath.   Cardiovascular:  Negative for chest pain.  Gastrointestinal:  Negative for abdominal pain, constipation, diarrhea, nausea and vomiting.  Musculoskeletal:  Positive for arthralgias and myalgias. Negative for back pain and neck pain.  Skin:  Positive for wound.  Neurological:  Negative for syncope and headaches.   Physical Exam Updated Vital Signs BP 140/87   Pulse 70   Temp 97.6 F (36.4 C)   Resp 18   Ht '5\' 6"'$  (1.676 m)   Wt 93.4 kg   SpO2 99%   BMI 33.25 kg/m  Physical Exam Vitals and nursing  note reviewed.  Constitutional:      General: He is not in acute distress.    Appearance: Normal appearance. He is not ill-appearing or toxic-appearing.  HENT:     Head:     Comments: Mole noted to the left upper aspect of the patient's scalp, some surrounding blood, but no laceration. No step offs or deformities. See picture.     Mouth/Throat:     Mouth: Mucous membranes are moist.  Eyes:     General: No scleral icterus.    Extraocular Movements: Extraocular movements intact.     Pupils: Pupils are equal, round, and reactive to light.  Neck:     Comments: Full ROM of neck. No midline or paraspinal cervical tenderness. No signs of trauma. No step offs or  deformities.  Cardiovascular:     Rate and Rhythm: Normal rate. Rhythm irregular.     Pulses: Normal pulses.  Pulmonary:     Effort: Pulmonary effort is normal. No respiratory distress.     Breath sounds: Normal breath sounds.  Abdominal:     General: Bowel sounds are normal.     Palpations: Abdomen is soft.     Tenderness: There is no abdominal tenderness. There is no guarding or rebound.  Musculoskeletal:        General: Tenderness and signs of injury present. No deformity.     Cervical back: Normal range of motion. No tenderness.     Comments: No midline or paraspinal cervical, thoracic, or lumbar tenderness palpation.  No step-offs or deformities.  Nontender to palpation.  He has no snuffbox tenderness bilaterally.  He has normal finger to thumb opposition bilaterally.  No pronator drift.  He has equal strength in his upper and lower bilateral extremities, although does have some pain with resistance with his left knee.  Compartments are soft.  Sensation intact throughout.  Palpable pulses.  Skin:    General: Skin is warm and dry.     Comments: Approximately 2.5 cm to the base of the right thumb. It is shallow, but will require sutures. Approximately 10 cm laceration to the left knee. No obvious contamination. Bleeding controlled. He does have an abrasion noted to his left elbow and several superficial skin tears noted tot eh right hand and lower arm. See pictures.   Neurological:     General: No focal deficit present.     Mental Status: He is alert. Mental status is at baseline.     GCS: GCS eye subscore is 4. GCS verbal subscore is 5. GCS motor subscore is 6.     Cranial Nerves: No cranial nerve deficit.     Sensory: No sensory deficit.     Motor: No pronator drift.     Coordination: Finger-Nose-Finger Test normal.     Gait: Gait is intact.     Comments: GCS 15.  Patient alert and oriented x3.  Cranial nerves II through XII intact.  No sensation deficit.  No pronator drift.  Strength  is equal in patient's upper and lower bilateral extremities.  Normal finger-to-nose.  Normal coordination.  Patient is ambulatory.            ED Results / Procedures / Treatments   Labs (all labs ordered are listed, but only abnormal results are displayed) Labs Reviewed  CBC WITH DIFFERENTIAL/PLATELET - Abnormal; Notable for the following components:      Result Value   RBC 3.09 (*)    Hemoglobin 9.7 (*)    HCT 31.1 (*)  MCV 100.6 (*)    RDW 17.3 (*)    Lymphs Abs 0.5 (*)    All other components within normal limits  BASIC METABOLIC PANEL - Abnormal; Notable for the following components:   Glucose, Bld 145 (*)    BUN 64 (*)    Creatinine, Ser 2.25 (*)    Calcium 8.6 (*)    GFR, Estimated 28 (*)    All other components within normal limits    EKG None  Radiology DG Pelvis 1-2 Views  Result Date: 01/04/2022 CLINICAL DATA:  Fall. EXAM: PELVIS - 1-2 VIEW COMPARISON:  None Available. FINDINGS: There is no evidence of pelvic fracture or diastasis. No pelvic bone lesions are seen. IMPRESSION: Negative. Electronically Signed   By: Marijo Conception M.D.   On: 01/04/2022 13:58   DG Elbow Complete Left  Result Date: 01/04/2022 CLINICAL DATA:  Fall today. EXAM: LEFT ELBOW - COMPLETE 3+ VIEW COMPARISON:  None Available. FINDINGS: There is no evidence of fracture, dislocation, or joint effusion. There is no evidence of arthropathy or other focal bone abnormality. Soft tissues are unremarkable. IMPRESSION: Negative. Electronically Signed   By: Marijo Conception M.D.   On: 01/04/2022 13:47   DG Elbow Complete Right  Result Date: 01/04/2022 CLINICAL DATA:  Post fall with abrasions to the RIGHT elbow. EXAM: RIGHT ELBOW - COMPLETE 3+ VIEW COMPARISON:  None available aside from contralateral elbow of the same date. FINDINGS: Degenerative changes about the RIGHT elbow. No sign of fracture or dislocation. No joint effusion. IMPRESSION: Degenerative changes in the RIGHT elbow. Electronically  Signed   By: Zetta Bills M.D.   On: 01/04/2022 13:47   DG Wrist Complete Left  Result Date: 01/04/2022 CLINICAL DATA:  Fall today. EXAM: LEFT WRIST - COMPLETE 3+ VIEW COMPARISON:  None Available. FINDINGS: There is no evidence of fracture or dislocation. Severe degenerative changes seen involving the first carpometacarpal joint. Soft tissues are unremarkable. 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 No acute abnormality is noted. Electronically Signed   By: Marijo Conception M.D.   On: 01/04/2022 13:51   DG Wrist Complete Right  Result Date: 01/04/2022 CLINICAL DATA:  Fall today. EXAM: RIGHT WRIST - COMPLETE 3+ VIEW COMPARISON:  None Available. FINDINGS: There is no evidence of fracture or dislocation. Moderate degenerative changes seen involving the first carpometacarpal joint. Soft tissues are unremarkable. IMPRESSION: Moderate osteoarthritis of first carpometacarpal joint. No acute abnormality seen. Electronically Signed   By: Marijo Conception M.D.   On: 01/04/2022 13:49   CT Head Wo Contrast  Result Date: 01/04/2022 CLINICAL DATA:  Head trauma, minor (Age >= 65y) EXAM: CT HEAD WITHOUT CONTRAST TECHNIQUE: Contiguous axial images were obtained from the base of the skull through the vertex without intravenous contrast. RADIATION DOSE REDUCTION: This exam was performed according to the departmental dose-optimization program which includes automated exposure control, adjustment of the mA and/or kV according to patient size and/or use of iterative reconstruction technique. COMPARISON:  None Available. FINDINGS: Brain: There is no acute intracranial hemorrhage, mass effect, or edema. Gray-white differentiation is preserved. There is no extra-axial fluid collection. Prominence of the ventricles and sulci reflects parenchymal volume loss. Patchy and confluent hypoattenuation in the supratentorial white matter is nonspecific but may reflect chronic microvascular ischemic changes.  Vascular: There is atherosclerotic calcification at the skull base. Skull: Calvarium is unremarkable. Sinuses/Orbits: No acute finding. Other: Scalp soft tissue swelling on the left near the vertex. IMPRESSION: No evidence of acute intracranial  injury. Electronically Signed   By: Macy Mis M.D.   On: 01/04/2022 13:13   CT Cervical Spine Wo Contrast  Result Date: 01/04/2022 CLINICAL DATA:  Neck trauma (Age >= 65y) EXAM: CT CERVICAL SPINE WITHOUT CONTRAST TECHNIQUE: Multidetector CT imaging of the cervical spine was performed without intravenous contrast. Multiplanar CT image reconstructions were also generated. RADIATION DOSE REDUCTION: This exam was performed according to the departmental dose-optimization program which includes automated exposure control, adjustment of the mA and/or kV according to patient size and/or use of iterative reconstruction technique. COMPARISON:  None Available. FINDINGS: Alignment: Mild anterolisthesis at C3-C4. Skull base and vertebrae: Degenerative endplate irregularity. No acute fracture. Prominent anterior bridging osteophytes. Soft tissues and spinal canal: No prevertebral fluid or swelling. No visible canal hematoma. Disc levels: Multilevel degenerative changes are present including disc space narrowing, endplate osteophytes, and facet and uncovertebral hypertrophy. There is superimposed ossification of the posterior longitudinal ligament at C3 to C5-C6 levels. Associated canal stenosis. Upper chest: No apical lung mass. Other: Calcified plaque along the common and internal carotids. IMPRESSION: No acute cervical spine fracture. Cervical spondylosis. 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 Electronically Signed   By: Macy Mis M.D.   On: 01/04/2022 13:06   DG Knee Complete 4 Views Left  Result Date: 01/04/2022 CLINICAL DATA:  Left knee pain after fall today. EXAM: LEFT KNEE - COMPLETE 4+ VIEW  COMPARISON:  March 09, 2006. FINDINGS: Status post left total knee arthroplasty. Vascular calcifications are noted. No fracture or dislocation is noted. IMPRESSION: No acute abnormality seen. Electronically Signed   By: Marijo Conception M.D.   On: 01/04/2022 13:56   DG Knee Complete 4 Views Right  Result Date: 01/04/2022 CLINICAL DATA:  Fall today. EXAM: RIGHT KNEE - COMPLETE 4+ VIEW COMPARISON:  None Available. FINDINGS: No evidence of fracture, dislocation, or joint effusion. Mild narrowing of medial joint space is noted. Soft tissues are unremarkable. IMPRESSION: Mild degenerative joint disease is noted medially. No acute abnormality seen. Electronically Signed   By: Marijo Conception M.D.   On: 01/04/2022 13:57   DG Hand Complete Left  Result Date: 01/04/2022 CLINICAL DATA:  Fall today. EXAM: LEFT HAND - COMPLETE 3+ VIEW COMPARISON:  None Available. FINDINGS: There is no evidence of fracture or dislocation. Severe degenerative changes seen involving first carpometacarpal joint as well as first interphalangeal joint. Soft tissues are unremarkable. IMPRESSION: Severe osteoarthritis of first carpometacarpal joint. No acute abnormality is noted. Electronically Signed   By: Marijo Conception M.D.   On: 01/04/2022 13:53   DG Hand Complete Right  Result Date: 01/04/2022 CLINICAL DATA:  Fall today. EXAM: RIGHT HAND - COMPLETE 3+ VIEW COMPARISON:  None Available. FINDINGS: There is no evidence of fracture or dislocation. Moderate narrowing of the first carpometacarpal joint. Soft tissues are unremarkable. IMPRESSION: Moderate osteoarthritis of the first carpometacarpal joint. No acute abnormality seen. Electronically Signed   By: Marijo Conception M.D.   On: 01/04/2022 13:54     Procedures .Marland KitchenLaceration Repair  Date/Time: 01/07/2022 9:05 PM Performed by: Belva Bertin Authorized by: Sherrell Puller, PA-C   Consent:    Consent obtained:  Verbal   Consent given by:  Patient   Risks, benefits,  and alternatives were discussed: yes     Risks discussed:  Infection, need for additional repair, pain, poor cosmetic result, poor wound healing and nerve damage   Alternatives discussed:  No treatment and delayed treatment  Universal protocol:    Procedure explained and questions answered to patient or proxy's satisfaction: yes     Relevant documents present and verified: no     Test results available: no     Imaging studies available: yes     Required blood products, implants, devices, and special equipment available: no     Site/side marked: no     Immediately prior to procedure, a time out was called: no     Patient identity confirmed:  Verbally with patient Anesthesia:    Anesthesia method:  Local infiltration   Local anesthetic:  Lidocaine 1% w/o epi Laceration details:    Location:  Leg   Leg location:  L knee   Length (cm):  10 Pre-procedure details:    Preparation:  Patient was prepped and draped in usual sterile fashion and imaging obtained to evaluate for foreign bodies Exploration:    Hemostasis achieved with:  Direct pressure   Imaging obtained: x-ray     Imaging outcome: foreign body not noted     Wound exploration: wound explored through full range of motion and entire depth of wound visualized     Contaminated: no   Treatment:    Area cleansed with:  Saline   Amount of cleaning:  Extensive   Irrigation solution:  Sterile saline   Irrigation method:  Pressure wash and syringe Skin repair:    Repair method:  Sutures   Suture size:  4-0   Suture material:  Nylon   Suture technique:  Horizontal mattress and simple interrupted   Number of sutures: 6 simple and 6 horizontal mattress. Approximation:    Approximation:  Loose (Was not able to closely apprixmately the wound due to tissue edema) Repair type:    Repair type:  Intermediate Post-procedure details:    Dressing:  Non-adherent dressing   Procedure completion:  Tolerated well, no immediate  complications .Marland KitchenLaceration Repair  Date/Time: 01/07/2022 9:08 PM Performed by: Belva Bertin Authorized by: Sherrell Puller, PA-C   Consent:    Consent obtained:  Verbal   Consent given by:  Patient   Risks discussed:  Infection, need for additional repair, pain, poor cosmetic result, poor wound healing and nerve damage   Alternatives discussed:  No treatment and delayed treatment Universal protocol:    Procedure explained and questions answered to patient or proxy's satisfaction: yes     Relevant documents present and verified: no     Test results available: no     Imaging studies available: no     Required blood products, implants, devices, and special equipment available: no     Site/side marked: no     Immediately prior to procedure, a time out was called: no     Patient identity confirmed:  Verbally with patient Anesthesia:    Anesthesia method:  Local infiltration   Local anesthetic:  Lidocaine 1% w/o epi Laceration details:    Location:  Finger   Finger location:  R thumb (base of right thumb)   Length (cm):  2.5 Pre-procedure details:    Preparation:  Patient was prepped and draped in usual sterile fashion Exploration:    Hemostasis achieved with:  Direct pressure   Wound exploration: wound explored through full range of motion and entire depth of wound visualized     Contaminated: no   Treatment:    Area cleansed with:  Saline   Amount of cleaning:  Extensive   Irrigation solution:  Sterile saline Skin repair:  Repair method:  Sutures   Suture size:  4-0   Suture material:  Nylon   Suture technique:  Simple interrupted   Number of sutures:  2 Approximation:    Approximation:  Close Repair type:    Repair type:  Simple Post-procedure details:    Dressing:  Non-adherent dressing   Procedure completion:  Tolerated well, no immediate complications  Cardiac monitor shows : afib which is chronic for the patient.   Medications Ordered in  ED Medications  sodium chloride 0.9 % bolus 500 mL (0 mLs Intravenous Stopped 01/04/22 1545)  lidocaine (PF) (XYLOCAINE) 1 % injection 30 mL (30 mLs Infiltration Given 01/04/22 1500)    ED Course/ Medical Decision Making/ A&P                           Medical Decision Making Amount and/or Complexity of Data Reviewed Labs: ordered. Radiology: ordered.  Risk Prescription drug management.   88 year old male patient presents the emergency department for evaluation after mechanical fall.  Differential diagnosis includes was not limited to bleed, fracture, dislocation, laceration.  Vital signs show normotensive, afebrile, normal pulse rate, satting well on room air without any increased work of breathing.  Physical exam is pertinent for mole noted to the left upper aspect of the patient's scalp, some surrounding blood, but no laceration. No step offs or deformities. See picture. Full ROM of neck. No midline or paraspinal cervical tenderness. No signs of trauma. No step offs or deformities. Irregular heart rhythm, pt knwon a fib. No midline or paraspinal cervical, thoracic, or lumbar tenderness palpation.  No step-offs or deformities.  Nontender to palpation.  He has no snuffbox tenderness bilaterally.  He has normal finger to thumb opposition bilaterally.  No pronator drift.  He has equal strength in his upper and lower bilateral extremities, although does have some pain with resistance with his left knee.  Compartments are soft.  Sensation intact throughout.  Palpable pulses. Approximately 2.5 cm to the base of the right thumb. It is shallow, but will require sutures. Approximately 10 cm laceration to the left knee. No obvious contamination. Bleeding controlled. He does have an abrasion noted to his left elbow and several superficial skin tears noted tot eh right hand and lower arm. See pictures. GCS 15.  Patient alert and oriented x3.  Cranial nerves II through XII intact.  No sensation deficit.  No  pronator drift.  Strength is equal in patient's upper and lower bilateral extremities.  Normal finger-to-nose.  Normal coordination.  Patient is ambulatory.   Upon seeing the patient, he was initially not leveled as a level 2 trauma. I alerted charge nurse Jinny Blossom immediately that the patient should be upgraded to a level 2 trauma and will need to go to the scanner.  Given the patient's fall, otherwise he is well-appearing.  He is denying any pain medication at this time.  Given the appearance of his lacerations as well as his advanced age, will order multiple imaging studies to rule out any fractures.  I independently reviewed and interpreted the patient's labs.  CBC shows mild anemia at 9.7 although there is no leukocytosis.  BMP shows mildly elevated glucose at 145 although this is not fasting.  He does have a significant elevated creatinine and BUN although this appears to be his baseline.  Calcium is mildly decreased at 8.6.  Patient does have known CKD.  CT of the head shows no evidence any acute intracranial  injury.  There is some scalp soft tissue swelling on the left near the vertex.  C2 cervical shows no acute cervical spine fracture.  There are some cervical spondylosis.  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  His left knee x-ray which is where his laceration is shows some vascular calcifications however there is no fracture or dislocation noted.  He is status post left knee total arthroplasty.  X-ray of his right knee shows mild degenerative joint disease is noted medially, there is no acute abnormality seen.  X-ray of his left hand shows severe osteoarthritis in the first metacarpal joint.  There is no acute abnormality noted.  X-ray of the right hand shows moderate osteoarthritis of the first carpometacarpal joint.  No acute abnormality seen.  Plain film of his pelvis shows no evidence any pelvic fracture diastasis.  No pelvic  bone lesions are seen.  X-ray of the left elbow shows no evidence any fracture, dislocation, or any joint effusion.  There is no evidence of any arthropathy or other focal bone abnormality.  Soft tissues are unremarkable.  Right x-ray of the elbow shows degenerative changes of the right elbow otherwise no acute fracture or dislocation.  X-ray of the left wrist shows severe osteoarthritis at the first carpometacarpal joint however no acute normality seen.  X-ray of the right wrist shows moderate osteoarthritis in of the carpometacarpal joint without any acute abnormality seen.  The patient is still adamant that he doesn't want any pain medication.   I directly supervised the PA student, Doylene Canning, perform the two laceration notes. The patient tolerated well. Nonadherent dressings placed. Please see procedures notes for additional information. Additionally, the patient had three superficial skin tears that I removed and cleansed and placed bandages.  We have placed him in a short knee immobilizer so he will not pull his stitches. He is ambulatory with the brace with his walker.   Given his reassuring imaging findings, the patient is safe for discharge with close outpatient follow up.   The patient's son and daughter join him at bedside. We discussed the imaging and lab findings. Discussed laceration wound care. Discussed that he will need to return to his PCP, urgent care, or ER in 1 week for removal of his hand sutures and in 2 weeks for removal of the sutures in his knee. We discussed red flag symptoms with head injuries and with wound infection. Will place the patient on doxycycline for possible skin contamination. They verbalize understanding and agree to the plan. The patient does not currently have a PCP in the area as he originally is from [place]. Included list of [place] PCPs. The patient is stable and being discharged home in good condition.   I discussed this case with my attending physician  who cosigned this note including patient's presenting symptoms, physical exam, and planned diagnostics and interventions. Attending physician stated agreement with plan or made changes to plan which were implemented.    Final Clinical Impression(s) / ED Diagnoses Final diagnoses:  Fall, initial encounter  Injury of head, initial encounter  Knee laceration, left, initial encounter  Laceration of right hand without foreign body, initial encounter    Rx / DC Orders ED Discharge Orders          Ordered    doxycycline (VIBRAMYCIN) 100 MG capsule  2 times daily        01/04/22 1902  Sherrell Puller, PA-C 01/07/22 2146    Milton Ferguson, MD 01/11/22 [phone]

## 2022-01-04 NOTE — Discharge Instructions (Addendum)
You were seen in the ER for evaluation after your fall. You have a laceration to your right hand and to your left knee that have been repaired. I have started you on an antibiotic that you will need to take twice daily for the next week. I have given you your first dose today. You can take Tylenol as needed for pain. Please leave the knee brace on for the next 48-72 hours, as well as walking with a walker, to keep your knee straight and allow for the swelling to go down so that you don't interfere with the sutures. It is important that you follow up with either your PCP, urgent care, or the ER in 7 days for removal of the sutures in your hand and 10-14 days for the removal of the sutures on your knee. Please make sure you are cleaning the lacerations daily with clean water and Dial soap. Make sure the area is dry before applying a bandage. I have included additional information on laceration care as well as fall prevention, RICE therapy, and head injury information. Please make sure to read. If you have any concern, new or worsening symptoms, please return to the nearest ER for re-evaluation.  ? ?Contact a doctor if: ?You got a tetanus shot and you have any of these problems where the needle went in: ?Swelling. ?Very bad pain. ?Redness. ?Bleeding. ?A wound that was closed breaks open. ?You have a fever. ?You have any of these signs of infection in your wound: ?More redness, swelling, or pain. ?Fluid or blood. ?Warmth. ?Pus or a bad smell. ?You see something coming out of the wound, such as wood or glass. ?Medicine does not make your pain go away. ?You notice a change in the color of your skin near your wound. ?You need to change the bandage often. ?You have a new rash. ?You lose feeling (have numbness) around the wound. ?Get help right away if: ?You have very bad swelling around the wound. ?Your pain suddenly gets worse and is very bad. ?You have painful lumps near the wound or on skin anywhere on your body. ?You  have a red streak going away from your wound. ?The wound is on your hand or foot, and: ?You cannot move a finger or toe. ?Your fingers or toes look pale or bluish. ?You have: ?A very bad headache that is not helped by medicine. ?Trouble walking or weakness in your arms and legs. ?Clear or bloody fluid coming from your nose or ears. ?Changes in how you see (vision). ?A seizure. ?More confusion or more grumpy moods. ?Your symptoms get worse. ?You are sleepier than normal and have trouble staying awake. ?You lose your balance. ?The black centers of your eyes (pupils) change in size. ?Your speech is slurred. ?Your dizziness gets worse. ?You vomit. ?These symptoms may be an emergency. Do not wait to see if the symptoms will go away. Get medical help right away. Call your local emergency services (911 in the [place] Do not drive yourself to the hospital. ? ? ? ?[facility] ? ?Tula Nakayama, MD. Specialty: Family Medicine Contact information: [address]  ?[place] [address]  ?[phone]  ? ?Sallee Lange, MD. Specialty: Family Medicine Contact information: [place]  ?[address]  ?[place] [address]  ?[phone]  ? ?Rosita Fire, MD Specialty: Internal Medicine Contact information: [place]  ?[place] [address]  ?[phone]  ? ?Delphina Cahill, MD. Specialty: Internal Medicine Contact information: [phone]  ?[place] [address]  ?[phone]  ? ? [facility] (Dr. Maudie Mercury) Specialty: Family Medicine Contact information: [phone] [place]  ?[place] [address]  ?[phone]  ? ?Leslie Andrea, MD. Specialty: Family Medicine Contact information: [place]  ?[place] [address]  ?[phone]  ? ?Asencion Noble, MD. Specialty: Internal Medicine Contact information: [place]  ?[address]  ?[place] [address]  ?[phone]  ? ? ?[facility] ? ?[address] ?[place], [place]  [address] ?[phone] ? ?Services ?[facility] offers a variety of basic health services. ? ?Services include but are not limited to: ?Blood pressure checks  ?Heart rate checks  ?Blood sugar checks  ?Urine analysis  ?Rapid strep tests  ?Pregnancy tests.  ?Health education and referrals ? ?People needing more complex services will be directed to a physician online. Using these virtual visits, doctors can evaluate and prescribe medicine and treatments. ?There will be no medication on-site, though [facility] will help patients fill their prescriptions at little to no cost. ? ? ?For More information please go to: ?[email or web address]  ?

## 2022-01-04 NOTE — ED Triage Notes (Signed)
Pt brought in by [facility] from home with c/o fall. Pt fell next to his golf cart. EMS reports they hit his leg on the golf cart hitch. EMS reports laceration to head, left knee, left elbow and right hand. Pt has pressure dressing to left knee from excessive bleeding. BP 157/85, HR 95 for EMS.  ?

## 2022-01-04 NOTE — ED Notes (Signed)
Pt transported to CT ?

## 2022-01-19 ENCOUNTER — Encounter (HOSPITAL_COMMUNITY): Payer: Self-pay

## 2022-01-19 ENCOUNTER — Emergency Department (HOSPITAL_COMMUNITY)
Admission: EM | Admit: 2022-01-19 | Discharge: 2022-01-19 | Disposition: A | Discharge status: Home / Self Care | Payer: Medicare (Managed Care) | Attending: Emergency Medicine | Admitting: Emergency Medicine

## 2022-01-19 ENCOUNTER — Other Ambulatory Visit: Payer: Self-pay

## 2022-01-19 DIAGNOSIS — Z7901 Long term (current) use of anticoagulants: Secondary | ICD-10-CM | POA: Insufficient documentation

## 2022-01-19 DIAGNOSIS — Z4802 Encounter for removal of sutures: Secondary | ICD-10-CM | POA: Diagnosis present

## 2022-01-19 DIAGNOSIS — Z79899 Other long term (current) drug therapy: Secondary | ICD-10-CM | POA: Diagnosis not present

## 2022-01-19 DIAGNOSIS — Z4889 Encounter for other specified surgical aftercare: Secondary | ICD-10-CM

## 2022-01-19 NOTE — ED Provider Notes (Signed)
[facility] Provider Note   CSN: [id number] Arrival date & time: 01/19/22  4098     History  Chief Complaint  Patient presents with   Suture / Staple Removal    Lawrence Harper is a 88 y.o. male.  Patient is here for suture removal he has had the sutures in for 14 days.  Patient denies any fever or chills.  The history is provided by the patient. No language interpreter was used.  Suture / Staple Removal This is a new problem. The problem occurs constantly. The problem has not changed since onset.Nothing aggravates the symptoms. Nothing relieves the symptoms. He has tried nothing for the symptoms. The treatment provided no relief.      Home Medications Prior to Admission medications   Medication Sig Start Date End Date Taking? Authorizing Provider  acetaminophen (TYLENOL) 650 MG CR tablet Take 1,300 mg by mouth every 8 (eight) hours as needed for pain.    [provider]  atorvastatin (LIPITOR) 40 MG tablet Take 40 mg by mouth daily. 07/22/21   [provider]  Calcium Carbonate-Vitamin D (CALCIUM 600 +D HIGH POTENCY) 600-10 MG-MCG TABS Take 1 tablet by mouth daily.    [provider]  doxycycline (VIBRAMYCIN) 100 MG capsule Take 1 capsule (100 mg total) by mouth 2 (two) times daily. 01/04/22   Sherrell Puller, PA-C  ELIQUIS 5 MG TABS tablet Take 2.5 mg by mouth 2 (two) times daily. 06/10/21   [provider]  ferrous sulfate 325 (65 FE) MG tablet Take 325 mg by mouth daily with breakfast.    [provider]  folic acid (FOLVITE) 1 MG tablet Take 1 mg by mouth daily.    [provider]  nitroGLYCERIN (NITROSTAT) 0.4 MG SL tablet Place 1 tablet (0.4 mg total) under the tongue every 5 (five) minutes as needed for chest pain. 05/13/13   Jerline Pain, MD  nystatin (MYCOSTATIN/NYSTOP) powder Apply topically 3 (three) times daily. 08/17/21   Johnson, Clanford L, MD  potassium chloride (KLOR-CON M) 10 MEQ tablet  Take 1 tablet (10 mEq total) by mouth every other day. 08/19/21   Johnson, Clanford L, MD  timolol (TIMOPTIC) 0.5 % ophthalmic solution Place 1 drop into both eyes 2 (two) times daily. 07/22/21   [provider]  torsemide (DEMADEX) 10 MG tablet Take 3 tablets (30 mg total) by mouth daily. 08/18/21   Murlean Iba, MD      Allergies    Patient has no known allergies.    Review of Systems   Review of Systems  Constitutional:  Negative for fever.  Skin:  Positive for wound.  All other systems reviewed and are negative.  Physical Exam Updated Vital Signs BP (!) 162/90 (BP Location: Left Arm)   Pulse 95   Temp 97.6 F (36.4 C) (Oral)   Resp 18   Ht '5\' 6"'$  (1.676 m)   Wt 93.4 kg   SpO2 100%   BMI 33.25 kg/m  Physical Exam Vitals reviewed.  Constitutional:      Appearance: Normal appearance.  Skin:    Comments: Well-healed laceration right hand with 2 sutures left knee multiple sutures across knee wound pills healed but has a large scab.  Neurological:     General: No focal deficit present.     Mental Status: He is alert.  Psychiatric:        Mood and Affect: Mood normal.    ED Results / Procedures / Treatments  Labs (all labs ordered are listed, but only abnormal results are displayed) Labs Reviewed - No data to display  EKG None  Radiology No results found.  Procedures Procedures    Medications Ordered in ED Medications - No data to display  ED Course/ Medical Decision Making/ A&P                           Medical Decision Making  MDM we will remove the sutures placed Steri-Strips to knee to continue healing        Final Clinical Impression(s) / ED Diagnoses Final diagnoses:  Suture check    Rx / DC Orders ED Discharge Orders     None     An After Visit Summary was printed and given to the patient.     Fransico Meadow, [place] 01/19/22 9191    Noemi Chapel, MD 01/20/22 [phone]

## 2022-01-19 NOTE — ED Triage Notes (Signed)
Pt brought to ED for suture removal from right hand and left knee. Pt denies any problems

## 2022-04-30 ENCOUNTER — Other Ambulatory Visit: Payer: Self-pay

## 2022-04-30 ENCOUNTER — Emergency Department (HOSPITAL_COMMUNITY): Payer: Medicare (Managed Care)

## 2022-04-30 ENCOUNTER — Encounter (HOSPITAL_COMMUNITY): Payer: Self-pay | Admitting: *Deleted

## 2022-04-30 DIAGNOSIS — I878 Other specified disorders of veins: Secondary | ICD-10-CM | POA: Diagnosis present

## 2022-04-30 DIAGNOSIS — D72829 Elevated white blood cell count, unspecified: Secondary | ICD-10-CM | POA: Diagnosis present

## 2022-04-30 DIAGNOSIS — A419 Sepsis, unspecified organism: Secondary | ICD-10-CM | POA: Diagnosis present

## 2022-04-30 DIAGNOSIS — I350 Nonrheumatic aortic (valve) stenosis: Secondary | ICD-10-CM | POA: Diagnosis present

## 2022-04-30 DIAGNOSIS — I10 Essential (primary) hypertension: Secondary | ICD-10-CM | POA: Diagnosis present

## 2022-04-30 DIAGNOSIS — I509 Heart failure, unspecified: Secondary | ICD-10-CM | POA: Diagnosis not present

## 2022-04-30 DIAGNOSIS — Z515 Encounter for palliative care: Secondary | ICD-10-CM

## 2022-04-30 DIAGNOSIS — E1151 Type 2 diabetes mellitus with diabetic peripheral angiopathy without gangrene: Secondary | ICD-10-CM | POA: Diagnosis present

## 2022-04-30 DIAGNOSIS — I21A1 Myocardial infarction type 2: Secondary | ICD-10-CM | POA: Diagnosis present

## 2022-04-30 DIAGNOSIS — Z79899 Other long term (current) drug therapy: Secondary | ICD-10-CM

## 2022-04-30 DIAGNOSIS — I4821 Permanent atrial fibrillation: Secondary | ICD-10-CM | POA: Diagnosis present

## 2022-04-30 DIAGNOSIS — G9341 Metabolic encephalopathy: Secondary | ICD-10-CM | POA: Diagnosis not present

## 2022-04-30 DIAGNOSIS — Z955 Presence of coronary angioplasty implant and graft: Secondary | ICD-10-CM

## 2022-04-30 DIAGNOSIS — E669 Obesity, unspecified: Secondary | ICD-10-CM | POA: Diagnosis present

## 2022-04-30 DIAGNOSIS — Z6837 Body mass index (BMI) 37.0-37.9, adult: Secondary | ICD-10-CM

## 2022-04-30 DIAGNOSIS — I2729 Other secondary pulmonary hypertension: Secondary | ICD-10-CM | POA: Diagnosis present

## 2022-04-30 DIAGNOSIS — M1990 Unspecified osteoarthritis, unspecified site: Secondary | ICD-10-CM | POA: Diagnosis present

## 2022-04-30 DIAGNOSIS — Z9049 Acquired absence of other specified parts of digestive tract: Secondary | ICD-10-CM

## 2022-04-30 DIAGNOSIS — E46 Unspecified protein-calorie malnutrition: Secondary | ICD-10-CM

## 2022-04-30 DIAGNOSIS — I4891 Unspecified atrial fibrillation: Secondary | ICD-10-CM | POA: Diagnosis present

## 2022-04-30 DIAGNOSIS — I5033 Acute on chronic diastolic (congestive) heart failure: Secondary | ICD-10-CM | POA: Diagnosis present

## 2022-04-30 DIAGNOSIS — I214 Non-ST elevation (NSTEMI) myocardial infarction: Secondary | ICD-10-CM | POA: Diagnosis present

## 2022-04-30 DIAGNOSIS — E441 Mild protein-calorie malnutrition: Secondary | ICD-10-CM | POA: Diagnosis present

## 2022-04-30 DIAGNOSIS — E871 Hypo-osmolality and hyponatremia: Secondary | ICD-10-CM | POA: Diagnosis not present

## 2022-04-30 DIAGNOSIS — Z23 Encounter for immunization: Secondary | ICD-10-CM

## 2022-04-30 DIAGNOSIS — I252 Old myocardial infarction: Secondary | ICD-10-CM

## 2022-04-30 DIAGNOSIS — N179 Acute kidney failure, unspecified: Secondary | ICD-10-CM | POA: Diagnosis present

## 2022-04-30 DIAGNOSIS — E1122 Type 2 diabetes mellitus with diabetic chronic kidney disease: Secondary | ICD-10-CM | POA: Diagnosis present

## 2022-04-30 DIAGNOSIS — R140 Abdominal distension (gaseous): Secondary | ICD-10-CM | POA: Diagnosis not present

## 2022-04-30 DIAGNOSIS — I5082 Biventricular heart failure: Secondary | ICD-10-CM | POA: Diagnosis present

## 2022-04-30 DIAGNOSIS — E611 Iron deficiency: Secondary | ICD-10-CM | POA: Diagnosis present

## 2022-04-30 DIAGNOSIS — K219 Gastro-esophageal reflux disease without esophagitis: Secondary | ICD-10-CM | POA: Diagnosis present

## 2022-04-30 DIAGNOSIS — J690 Pneumonitis due to inhalation of food and vomit: Secondary | ICD-10-CM | POA: Diagnosis not present

## 2022-04-30 DIAGNOSIS — J449 Chronic obstructive pulmonary disease, unspecified: Secondary | ICD-10-CM | POA: Diagnosis present

## 2022-04-30 DIAGNOSIS — I2510 Atherosclerotic heart disease of native coronary artery without angina pectoris: Secondary | ICD-10-CM | POA: Diagnosis present

## 2022-04-30 DIAGNOSIS — Z66 Do not resuscitate: Secondary | ICD-10-CM | POA: Diagnosis not present

## 2022-04-30 DIAGNOSIS — D631 Anemia in chronic kidney disease: Secondary | ICD-10-CM | POA: Diagnosis present

## 2022-04-30 DIAGNOSIS — E7800 Pure hypercholesterolemia, unspecified: Secondary | ICD-10-CM | POA: Diagnosis present

## 2022-04-30 DIAGNOSIS — I130 Hypertensive heart and chronic kidney disease with heart failure and stage 1 through stage 4 chronic kidney disease, or unspecified chronic kidney disease: Secondary | ICD-10-CM | POA: Diagnosis not present

## 2022-04-30 DIAGNOSIS — N184 Chronic kidney disease, stage 4 (severe): Secondary | ICD-10-CM | POA: Diagnosis present

## 2022-04-30 DIAGNOSIS — E8809 Other disorders of plasma-protein metabolism, not elsewhere classified: Secondary | ICD-10-CM | POA: Diagnosis present

## 2022-04-30 DIAGNOSIS — Z20822 Contact with and (suspected) exposure to covid-19: Secondary | ICD-10-CM | POA: Diagnosis present

## 2022-04-30 DIAGNOSIS — Z7901 Long term (current) use of anticoagulants: Secondary | ICD-10-CM

## 2022-04-30 DIAGNOSIS — R100 Acute abdomen: Secondary | ICD-10-CM | POA: Diagnosis not present

## 2022-04-30 DIAGNOSIS — E876 Hypokalemia: Secondary | ICD-10-CM | POA: Diagnosis present

## 2022-04-30 LAB — HEPATIC FUNCTION PANEL
ALT: 13 U/L (ref 0–44)
AST: 22 U/L (ref 15–41)
Albumin: 3.2 g/dL — ABNORMAL LOW (ref 3.5–5.0)
Alkaline Phosphatase: 135 U/L — ABNORMAL HIGH (ref 38–126)
Bilirubin, Direct: 0.4 mg/dL — ABNORMAL HIGH (ref 0.0–0.2)
Indirect Bilirubin: 1.1 mg/dL — ABNORMAL HIGH (ref 0.3–0.9)
Total Bilirubin: 1.5 mg/dL — ABNORMAL HIGH (ref 0.3–1.2)
Total Protein: 8.5 g/dL — ABNORMAL HIGH (ref 6.5–8.1)

## 2022-04-30 LAB — URINALYSIS, ROUTINE W REFLEX MICROSCOPIC
Bacteria, UA: NONE SEEN
Bilirubin Urine: NEGATIVE
Glucose, UA: NEGATIVE mg/dL
Ketones, ur: NEGATIVE mg/dL
Leukocytes,Ua: NEGATIVE
Nitrite: NEGATIVE
Protein, ur: 30 mg/dL — AB
Specific Gravity, Urine: 1.013 (ref 1.005–1.030)
pH: 7 (ref 5.0–8.0)

## 2022-04-30 LAB — BASIC METABOLIC PANEL
Anion gap: 9 (ref 5–15)
BUN: 56 mg/dL — ABNORMAL HIGH (ref 8–23)
CO2: 25 mmol/L (ref 22–32)
Calcium: 9.2 mg/dL (ref 8.9–10.3)
Chloride: 103 mmol/L (ref 98–111)
Creatinine, Ser: 2.34 mg/dL — ABNORMAL HIGH (ref 0.61–1.24)
GFR, Estimated: 26 mL/min — ABNORMAL LOW (ref >60)
Glucose, Bld: 96 mg/dL (ref 70–99)
Potassium: 4.9 mmol/L (ref 3.5–5.1)
Sodium: 137 mmol/L (ref 135–145)

## 2022-04-30 LAB — CBC
HCT: 37.1 % — ABNORMAL LOW (ref 39.0–52.0)
Hemoglobin: 11.4 g/dL — ABNORMAL LOW (ref 13.0–17.0)
MCH: 30.2 pg (ref 26.0–34.0)
MCHC: 30.7 g/dL (ref 30.0–36.0)
MCV: 98.1 fL (ref 80.0–100.0)
Platelets: 227 10*3/uL (ref 150–400)
RBC: 3.78 MIL/uL — ABNORMAL LOW (ref 4.22–5.81)
RDW: 19.2 % — ABNORMAL HIGH (ref 11.5–15.5)
WBC: 12.3 10*3/uL — ABNORMAL HIGH (ref 4.0–10.5)
nRBC: 0 % (ref 0.0–0.2)

## 2022-04-30 MED ORDER — FENTANYL CITRATE PF 50 MCG/ML IJ SOSY
50.0000 ug | PREFILLED_SYRINGE | Freq: Once | INTRAMUSCULAR | Status: AC
  Administered 2022-05-01: 50 ug via INTRAVENOUS
  Filled 2022-04-30: qty 1

## 2022-04-30 NOTE — ED Provider Notes (Signed)
[facility] Emergency Department Provider Note MRN:  [id number]  Arrival date & time: 05/01/22     Chief Complaint   Fatigue   History of Present Illness   Lawrence Harper is a 88 y.o. year-old male with a history of heart failure, A-fib presenting to the ED with chief complaint of fatigue.  Malaise, fatigue, shortness of breath progressively worsening over the past 2 weeks.  Also having increased fluid retention over the past 2 weeks in the legs extending up into the abdomen.  Shortness of breath worse when lying flat.  Denies chest pain.  Thinks maybe he had a fever yesterday.  Review of Systems  A thorough review of systems was obtained and all systems are negative except as noted in the HPI and PMH.   Patient's Health History    Past Medical History:  Diagnosis Date   (HFpEF) heart failure with preserved ejection fraction ([place])    Echocardiogram 03/16/16 [facility] in [place]): EF 55, normal RVSF, mod to severe TR, mild AS (mean 10 mmHg), trace AI, mild PI, mild MR, small secundum ASD w L-R shunt, RVSP 40   Arthritis    Atrial fibrillation (HCC)    Cancer (HCC)    Chronic kidney disease    Coronary artery disease    NSTEMI 9/14 >> Cath: LM ok, pLAD 50, mLAD 95, pD1 80; LCx/RI/OM1/OM2/LPDA/RCA patent >> PCI: 2.5 x 24 mm Promus DES to mLAD   Diabetes mellitus without complication (HCC)    Diverticulitis    GERD (gastroesophageal reflux disease)    High cholesterol    History of non-ST elevation myocardial infarction in 2014 s/p DES to LAD    Hypertension    Peripheral vascular disease ([place])     Past Surgical History:  Procedure Laterality Date   CHOLECYSTECTOMY     CORONARY ANGIOPLASTY     EYE SURGERY     KNEE SURGERY Left    KNEE SURGERY     LEFT HEART CATH N/A 05/12/2013   Procedure: LEFT HEART CATH;  Surgeon: Jettie Booze, MD;  Location: [facility] CATH LAB;  Service: Cardiovascular;  Laterality: N/A;    Family History  Problem Relation  Age of Onset   Throat cancer Mother 69    Social History   Socioeconomic History   Marital status: Married    Spouse name: Not on file   Number of children: Not on file   Years of education: Not on file   Highest education level: Not on file  Occupational History   Not on file  Tobacco Use   Smoking status: Never   Smokeless tobacco: Never  Vaping Use   Vaping Use: Never used  Substance and Sexual Activity   Alcohol use: No   Drug use: No   Sexual activity: Not on file  Other Topics Concern   Not on file  Social History Narrative   Not on file   Social Determinants of Health   Financial Resource Strain: Not on file  Food Insecurity: Not on file  Transportation Needs: Not on file  Physical Activity: Not on file  Stress: Not on file  Social Connections: Not on file  Intimate Partner Violence: Not on file     Physical Exam   Vitals:   05/01/22 0015 05/01/22 0030  BP:  134/84  Pulse: 96 80  Resp: 16 16  Temp:    SpO2: 94% 93%    CONSTITUTIONAL: Chronically ill-appearing, NAD NEURO/PSYCH:  Alert and oriented x  3, no focal deficits EYES:  eyes equal and reactive ENT/NECK:  no LAD, no JVD CARDIO: Regular rate, well-perfused, normal S1 and S2 PULM:  CTAB no wheezing or rhonchi GI/GU:  non-distended, non-tender MSK/SPINE:  No gross deformities, pitting edema to bilateral lower extremities with weeping of the right lower extremity SKIN:  no rash, atraumatic   *Additional and/or pertinent findings included in MDM below  Diagnostic and Interventional Summary    EKG Interpretation  Date/Time:  Friday April 30 2022 17:56:41 EDT Ventricular Rate:  90 PR Interval:    QRS Duration: 136 QT Interval:  384 QTC Calculation: 469 R Axis:   65 Text Interpretation: Undetermined rhythm Non-specific intra-ventricular conduction block Cannot rule out Septal infarct , age undetermined T wave abnormality, consider anterior ischemia Abnormal ECG When compared with ECG of  04-Jan-2022 12:26, PREVIOUS ECG IS PRESENT Confirmed by Gerlene Fee [phone]) on 05/15/2022 11:40:37 PM       Labs Reviewed  BASIC METABOLIC PANEL - Abnormal; Notable for the following components:      Result Value   BUN 56 (*)    Creatinine, Ser 2.34 (*)    GFR, Estimated 26 (*)    All other components within normal limits  CBC - Abnormal; Notable for the following components:   WBC 12.3 (*)    RBC 3.78 (*)    Hemoglobin 11.4 (*)    HCT 37.1 (*)    RDW 19.2 (*)    All other components within normal limits  URINALYSIS, ROUTINE W REFLEX MICROSCOPIC - Abnormal; Notable for the following components:   Hgb urine dipstick MODERATE (*)    Protein, ur 30 (*)    All other components within normal limits  HEPATIC FUNCTION PANEL - Abnormal; Notable for the following components:   Total Protein 8.5 (*)    Albumin 3.2 (*)    Alkaline Phosphatase 135 (*)    Total Bilirubin 1.5 (*)    Bilirubin, Direct 0.4 (*)    Indirect Bilirubin 1.1 (*)    All other components within normal limits  BRAIN NATRIURETIC PEPTIDE - Abnormal; Notable for the following components:   B Natriuretic Peptide 2,619.0 (*)    All other components within normal limits  MAGNESIUM - Abnormal; Notable for the following components:   Magnesium 2.5 (*)    All other components within normal limits  TROPONIN I (HIGH SENSITIVITY) - Abnormal; Notable for the following components:   Troponin I (High Sensitivity) 31 (*)    All other components within normal limits    DG Chest 2 View  Final Result      Medications  furosemide (LASIX) injection 40 mg (has no administration in time range)  fentaNYL (SUBLIMAZE) injection 50 mcg (50 mcg Intravenous Given 05/01/22 0000)     Procedures  /  Critical Care Procedures  ED Course and Medical Decision Making  Initial Impression and Ddx On exam patient appears to have anasarca, per history from patient and patient's son this is progressively worsening over the past 2 weeks.  History  of heart failure and so exacerbation is favored.  Has been increasing his fluid pill at home without any improvement.  Also describing orthopnea.  Report of possible subjective fever yesterday and so UTI, pneumonia also considered.  Electrolyte disturbance, renal/liver dysfunction also a possibility.  Past medical/surgical history that increases complexity of ED encounter: CHF  Interpretation of Diagnostics I personally reviewed the EKG and my interpretation is as follows: Suspect A-fib with right bundle branch pattern, largely  unchanged from prior  Labs reveal markedly elevated BNP, minimally elevated troponin, otherwise no significant changes to blood counts or electrolytes, baseline renal function  Patient Reassessment and Ultimate Disposition/Management    Plan is for admission for continued diuresis.  Patient management required discussion with the following services or consulting groups:  Hospitalist Service  Complexity of Problems Addressed Acute illness or injury that poses threat of life of bodily function  Additional Data Reviewed and Analyzed Further history obtained from: Further history from spouse/family member  Additional Factors Impacting ED Encounter Risk Consideration of hospitalization  Barth Kirks. Sedonia Small, [place] [email or web address]  Final Clinical Impressions(s) / ED Diagnoses     ICD-10-CM   1. Acute on chronic congestive heart failure, unspecified heart failure type ([place])  I50.9       ED Discharge Orders     None        Discharge Instructions Discussed with and Provided to Patient:   Discharge Instructions   None      Maudie Flakes, MD 05/01/22 [phone]

## 2022-04-30 NOTE — ED Triage Notes (Signed)
Pt c/o fatigue and generalized weakness over past several days.  SOB been getting worse as well. Pt c/o weeping to right lower leg.

## 2022-05-01 ENCOUNTER — Inpatient Hospital Stay (HOSPITAL_COMMUNITY): Payer: Medicare (Managed Care)

## 2022-05-01 ENCOUNTER — Other Ambulatory Visit: Payer: Self-pay

## 2022-05-01 DIAGNOSIS — E8809 Other disorders of plasma-protein metabolism, not elsewhere classified: Secondary | ICD-10-CM

## 2022-05-01 DIAGNOSIS — I10 Essential (primary) hypertension: Secondary | ICD-10-CM

## 2022-05-01 DIAGNOSIS — I2510 Atherosclerotic heart disease of native coronary artery without angina pectoris: Secondary | ICD-10-CM

## 2022-05-01 DIAGNOSIS — J690 Pneumonitis due to inhalation of food and vomit: Secondary | ICD-10-CM | POA: Diagnosis not present

## 2022-05-01 DIAGNOSIS — I5033 Acute on chronic diastolic (congestive) heart failure: Secondary | ICD-10-CM

## 2022-05-01 DIAGNOSIS — I509 Heart failure, unspecified: Secondary | ICD-10-CM | POA: Diagnosis present

## 2022-05-01 DIAGNOSIS — I4821 Permanent atrial fibrillation: Secondary | ICD-10-CM | POA: Diagnosis present

## 2022-05-01 DIAGNOSIS — N184 Chronic kidney disease, stage 4 (severe): Secondary | ICD-10-CM | POA: Diagnosis present

## 2022-05-01 DIAGNOSIS — E1122 Type 2 diabetes mellitus with diabetic chronic kidney disease: Secondary | ICD-10-CM | POA: Diagnosis present

## 2022-05-01 DIAGNOSIS — E46 Unspecified protein-calorie malnutrition: Secondary | ICD-10-CM

## 2022-05-01 DIAGNOSIS — A419 Sepsis, unspecified organism: Secondary | ICD-10-CM | POA: Diagnosis present

## 2022-05-01 DIAGNOSIS — I5031 Acute diastolic (congestive) heart failure: Secondary | ICD-10-CM

## 2022-05-01 DIAGNOSIS — I214 Non-ST elevation (NSTEMI) myocardial infarction: Secondary | ICD-10-CM | POA: Diagnosis not present

## 2022-05-01 DIAGNOSIS — I482 Chronic atrial fibrillation, unspecified: Secondary | ICD-10-CM | POA: Diagnosis not present

## 2022-05-01 DIAGNOSIS — Z23 Encounter for immunization: Secondary | ICD-10-CM | POA: Diagnosis present

## 2022-05-01 DIAGNOSIS — Z20822 Contact with and (suspected) exposure to covid-19: Secondary | ICD-10-CM | POA: Diagnosis present

## 2022-05-01 DIAGNOSIS — J449 Chronic obstructive pulmonary disease, unspecified: Secondary | ICD-10-CM | POA: Diagnosis present

## 2022-05-01 DIAGNOSIS — E669 Obesity, unspecified: Secondary | ICD-10-CM

## 2022-05-01 DIAGNOSIS — I130 Hypertensive heart and chronic kidney disease with heart failure and stage 1 through stage 4 chronic kidney disease, or unspecified chronic kidney disease: Secondary | ICD-10-CM | POA: Diagnosis present

## 2022-05-01 DIAGNOSIS — I5032 Chronic diastolic (congestive) heart failure: Secondary | ICD-10-CM | POA: Diagnosis not present

## 2022-05-01 DIAGNOSIS — N179 Acute kidney failure, unspecified: Secondary | ICD-10-CM | POA: Diagnosis present

## 2022-05-01 DIAGNOSIS — D631 Anemia in chronic kidney disease: Secondary | ICD-10-CM | POA: Diagnosis present

## 2022-05-01 DIAGNOSIS — I2729 Other secondary pulmonary hypertension: Secondary | ICD-10-CM | POA: Diagnosis present

## 2022-05-01 DIAGNOSIS — Z66 Do not resuscitate: Secondary | ICD-10-CM | POA: Diagnosis not present

## 2022-05-01 DIAGNOSIS — G9341 Metabolic encephalopathy: Secondary | ICD-10-CM | POA: Diagnosis not present

## 2022-05-01 DIAGNOSIS — E441 Mild protein-calorie malnutrition: Secondary | ICD-10-CM | POA: Diagnosis present

## 2022-05-01 DIAGNOSIS — Z515 Encounter for palliative care: Secondary | ICD-10-CM | POA: Diagnosis not present

## 2022-05-01 DIAGNOSIS — I5082 Biventricular heart failure: Secondary | ICD-10-CM | POA: Diagnosis present

## 2022-05-01 DIAGNOSIS — E1151 Type 2 diabetes mellitus with diabetic peripheral angiopathy without gangrene: Secondary | ICD-10-CM | POA: Diagnosis present

## 2022-05-01 DIAGNOSIS — I350 Nonrheumatic aortic (valve) stenosis: Secondary | ICD-10-CM | POA: Diagnosis present

## 2022-05-01 DIAGNOSIS — I21A1 Myocardial infarction type 2: Secondary | ICD-10-CM | POA: Diagnosis present

## 2022-05-01 DIAGNOSIS — E871 Hypo-osmolality and hyponatremia: Secondary | ICD-10-CM | POA: Diagnosis not present

## 2022-05-01 DIAGNOSIS — D72829 Elevated white blood cell count, unspecified: Secondary | ICD-10-CM

## 2022-05-01 LAB — CBC
HCT: 34.7 % — ABNORMAL LOW (ref 39.0–52.0)
Hemoglobin: 10.8 g/dL — ABNORMAL LOW (ref 13.0–17.0)
MCH: 30.7 pg (ref 26.0–34.0)
MCHC: 31.1 g/dL (ref 30.0–36.0)
MCV: 98.6 fL (ref 80.0–100.0)
Platelets: 209 10*3/uL (ref 150–400)
RBC: 3.52 MIL/uL — ABNORMAL LOW (ref 4.22–5.81)
RDW: 19.6 % — ABNORMAL HIGH (ref 11.5–15.5)
WBC: 14.7 10*3/uL — ABNORMAL HIGH (ref 4.0–10.5)
nRBC: 0 % (ref 0.0–0.2)

## 2022-05-01 LAB — HEPARIN LEVEL (UNFRACTIONATED): Heparin Unfractionated: >1.1 IU/mL — ABNORMAL HIGH (ref 0.30–0.70)

## 2022-05-01 LAB — APTT
aPTT: 60 seconds — ABNORMAL HIGH (ref 24–36)
aPTT: 65 seconds — ABNORMAL HIGH (ref 24–36)

## 2022-05-01 LAB — BRAIN NATRIURETIC PEPTIDE: B Natriuretic Peptide: 2619 pg/mL — ABNORMAL HIGH (ref 0.0–100.0)

## 2022-05-01 LAB — ECHOCARDIOGRAM COMPLETE
AR max vel: 1.46 cm2
AV Area VTI: 1.32 cm2
AV Area mean vel: 1.35 cm2
AV Mean grad: 10 mmHg
AV Peak grad: 13.7 mmHg
Ao pk vel: 1.85 m/s
Area-P 1/2: 4.01 cm2
Height: 65 in
S' Lateral: 3.7 cm
Weight: 3650.82 oz

## 2022-05-01 LAB — SARS CORONAVIRUS 2 BY RT PCR: SARS Coronavirus 2 by RT PCR: NEGATIVE

## 2022-05-01 LAB — TROPONIN I (HIGH SENSITIVITY)
Troponin I (High Sensitivity): 1141 ng/L (ref <18)
Troponin I (High Sensitivity): 1314 ng/L (ref <18)
Troponin I (High Sensitivity): 31 ng/L — ABNORMAL HIGH (ref <18)
Troponin I (High Sensitivity): 486 ng/L (ref <18)
Troponin I (High Sensitivity): 920 ng/L (ref <18)

## 2022-05-01 LAB — MAGNESIUM: Magnesium: 2.5 mg/dL — ABNORMAL HIGH (ref 1.7–2.4)

## 2022-05-01 MED ORDER — ASPIRIN 81 MG PO TBEC
81.0000 mg | DELAYED_RELEASE_TABLET | Freq: Every day | ORAL | Status: DC
  Administered 2022-05-01 – 2022-05-04 (×3): 81 mg via ORAL
  Filled 2022-05-01 (×5): qty 1

## 2022-05-01 MED ORDER — ATORVASTATIN CALCIUM 40 MG PO TABS
40.0000 mg | ORAL_TABLET | Freq: Every day | ORAL | Status: DC
  Administered 2022-05-02 – 2022-05-04 (×3): 40 mg via ORAL
  Filled 2022-05-01 (×4): qty 1

## 2022-05-01 MED ORDER — FUROSEMIDE 10 MG/ML IJ SOLN
40.0000 mg | Freq: Two times a day (BID) | INTRAMUSCULAR | Status: DC
  Administered 2022-05-01: 40 mg via INTRAVENOUS
  Filled 2022-05-01: qty 4

## 2022-05-01 MED ORDER — VANCOMYCIN HCL 1250 MG/250ML IV SOLN: 1250.0000 mg | INTRAVENOUS | Status: DC

## 2022-05-01 MED ORDER — INFLUENZA VAC A&B SA ADJ QUAD 0.5 ML IM PRSY
0.5000 mL | PREFILLED_SYRINGE | INTRAMUSCULAR | Status: AC
  Administered 2022-05-02: 0.5 mL via INTRAMUSCULAR
  Filled 2022-05-01: qty 0.5

## 2022-05-01 MED ORDER — FERROUS SULFATE 325 (65 FE) MG PO TABS
325.0000 mg | ORAL_TABLET | Freq: Every day | ORAL | Status: DC
  Administered 2022-05-02 – 2022-05-04 (×3): 325 mg via ORAL
  Filled 2022-05-01 (×4): qty 1

## 2022-05-01 MED ORDER — CALCIUM CARBONATE-VITAMIN D 600-10 MG-MCG PO TABS: 1.0000 | ORAL_TABLET | Freq: Every day | ORAL | Status: DC

## 2022-05-01 MED ORDER — FUROSEMIDE 10 MG/ML IJ SOLN
40.0000 mg | Freq: Two times a day (BID) | INTRAMUSCULAR | Status: DC
  Administered 2022-05-01 – 2022-05-02 (×3): 40 mg via INTRAVENOUS
  Filled 2022-05-01 (×3): qty 4

## 2022-05-01 MED ORDER — VANCOMYCIN HCL 2000 MG/400ML IV SOLN
2000.0000 mg | Freq: Once | INTRAVENOUS | Status: AC
Start: 2022-05-01 — End: 2022-05-01
  Administered 2022-05-01: 2000 mg via INTRAVENOUS
  Filled 2022-05-01: qty 400

## 2022-05-01 MED ORDER — POTASSIUM CHLORIDE CRYS ER 10 MEQ PO TBCR
10.0000 meq | EXTENDED_RELEASE_TABLET | ORAL | Status: DC
  Administered 2022-05-02: 10 meq via ORAL
  Filled 2022-05-01: qty 1

## 2022-05-01 MED ORDER — FUROSEMIDE 10 MG/ML IJ SOLN
40.0000 mg | Freq: Once | INTRAMUSCULAR | Status: AC
  Administered 2022-05-01: 40 mg via INTRAVENOUS
  Filled 2022-05-01: qty 4

## 2022-05-01 MED ORDER — OYSTER SHELL CALCIUM/D3 500-5 MG-MCG PO TABS
1.0000 | ORAL_TABLET | Freq: Every day | ORAL | Status: DC
  Administered 2022-05-02 – 2022-05-04 (×3): 1 via ORAL
  Filled 2022-05-01 (×4): qty 1

## 2022-05-01 MED ORDER — VANCOMYCIN HCL IN DEXTROSE 1-5 GM/200ML-% IV SOLN: 1000.0000 mg | Freq: Once | INTRAVENOUS | Status: DC

## 2022-05-01 MED ORDER — ENSURE ENLIVE PO LIQD
237.0000 mL | Freq: Two times a day (BID) | ORAL | Status: DC
  Administered 2022-05-01 – 2022-05-04 (×6): 237 mL via ORAL

## 2022-05-01 MED ORDER — FOLIC ACID 1 MG PO TABS
1.0000 mg | ORAL_TABLET | Freq: Every day | ORAL | Status: DC
  Administered 2022-05-02 – 2022-05-04 (×3): 1 mg via ORAL
  Filled 2022-05-01 (×4): qty 1

## 2022-05-01 MED ORDER — HEPARIN (PORCINE) 25000 UT/250ML-% IV SOLN
1400.0000 [IU]/h | INTRAVENOUS | Status: DC
  Administered 2022-05-01: 1100 [IU]/h via INTRAVENOUS
  Administered 2022-05-02: 1400 [IU]/h via INTRAVENOUS
  Administered 2022-05-02: 1200 [IU]/h via INTRAVENOUS
  Filled 2022-05-01 (×3): qty 250

## 2022-05-01 MED ORDER — TIMOLOL MALEATE 0.5 % OP SOLN
1.0000 [drp] | Freq: Two times a day (BID) | OPHTHALMIC | Status: DC
Start: 2022-05-01 — End: 2022-05-07
  Administered 2022-05-01 – 2022-05-05 (×7): 1 [drp] via OPHTHALMIC
  Filled 2022-05-01 (×2): qty 5

## 2022-05-01 MED ORDER — METOPROLOL TARTRATE 12.5 MG HALF TABLET
12.5000 mg | ORAL_TABLET | Freq: Two times a day (BID) | ORAL | Status: DC
  Administered 2022-05-01 – 2022-05-04 (×6): 12.5 mg via ORAL
  Filled 2022-05-01 (×7): qty 1

## 2022-05-01 MED ORDER — PERFLUTREN LIPID MICROSPHERE
1.0000 mL | INTRAVENOUS | Status: AC | PRN
  Administered 2022-05-01: 6 mL via INTRAVENOUS

## 2022-05-01 NOTE — Progress Notes (Addendum)
Patient seen and evaluated, chart reviewed, please see EMR for updated orders. Please see full H&P dictated by admitting physician Dr Josephine Cables for same date of service.    Brief Summary:-  88 y.o. male with medical history significant of essential hypertension, CAD s/p stent placement, chronic atrial fibrillation, CKD stage IV, admitted on 05/01/2022 with acute exacerbation of diastolic CHF as well as demand ischemia with type II NSTEMI  Problem  Acute on chronic diastolic CHF/HFpEF  NSTEMI (non-ST elevated myocardial infarction)   Sepsis due to Rt Leg Cellulitis   Cad (Coronary Artery Disease)  Ckd (Chronic Kidney Disease), Stage IV (Hcc)  Essential Hypertension  Atrial Fibrillation (Hcc)     A/p 1)Acute diastolic CHF exacerbation--- BNP 2,619 (it was 522 about 8 months ago) -Chest x-ray with cardiomegaly and small left-sided effusion and vascular congestion--consistent with CHF overall -Patient presented with dyspnea, dyspnea on exertion and weight gain as well as increasing lower extremity edema -Echo on 05/01/2021 with EF of 50 to 55%, unable to evaluate wall motion or diastolic parameters due to poor quality pictures echo... There is evidence of moderate pulmonary hypertension, as well as severe TR and mild to moderate AAS -Please note that compared to December 2022 EF is now down to 50 to 55% from 65 to 70% at that time Troponin 486 >> 920>>1,141 -EKG without acute ischemic changes, RBB noted (not new) -IV Lasix -Daily weight and fluid input and output monitoring -EDP discussed case with on-call cardiologist at [facility] recommended transfer to [facility] for further cardiology evaluation -Please call and notify cardiology team when patient arrives at [facility]  2)NSTEMI --Type 2--- due to #1 above -Patient with history of CAD with prior angioplasty and stent placement -Troponins, EKG and echo as noted above #1 Hold Eliquis -Cardiologist recommends IV heparin,  aspirin, Lipitor and metoprolol -Please notify cardiologist recommendation: Patient arrives so official consult can be done  3) chronic atrial fibrillation--- hold Eliquis as above #2 -Continue metoprolol  4)Sepsis secondary to lower extremity cellulitis---POA -Patient with leukocytosis, respiratory rate above 20, heart rate above 90 - please see photos in epic -Venous Dopplers negative for acute DVT -IV vancomycin per cellulitis order set/protocol  5)Hypokalemia--- replace and recheck especially due to IV diuresis as above #1 -Magnesium is not low  6)CKD stage IV BUN/creatinine 56/2.34 (creatinine is within baseline range) Renally adjust medications, avoid nephrotoxic agents/dehydration/hypotension  7) chronic anemia in the setting of CKD IV--no obvious bleeding noted at this time -Continue iron sulfate -Patient is on IV heparin-so monitor for bleeding concerns  -Total care time is over 57 minutes  -Again please call cardiology service when patient arrives at [facility] to obtain official cardiology consult  Roxan Hockey, MD

## 2022-05-01 NOTE — Progress Notes (Addendum)
ANTICOAGULATION CONSULT NOTE - Initial Consult  Pharmacy Consult for heparin infusion Indication: chest pain/ACS and atrial fibrillation  No Known Allergies  Patient Measurements: Height: 5' 6.5" (168.9 cm) Weight: 97.1 kg (214 lb) IBW/kg (Calculated) : 64.95 Heparin Dosing Weight: 86 kg  Vital Signs: Temp: 98.1 F (36.7 C) (09/09 0220) Temp Source: Oral (09/09 0220) BP: 136/73 (09/09 0220) Pulse Rate: 84 (09/09 0220)  Labs: Recent Labs    05/02/2022 1824 05/21/2022 2352 05/01/22 0136  HGB 11.4*  --   --   HCT 37.1*  --   --   PLT 227  --   --   CREATININE 2.34*  --   --   TROPONINIHS  --  31* 486*    Estimated Creatinine Clearance: 24.5 mL/min (A) (by C-G formula based on SCr of 2.34 mg/dL (H)).   Medical History: Past Medical History:  Diagnosis Date   (HFpEF) heart failure with preserved ejection fraction ([place])    Echocardiogram 03/16/16 [facility] in [place]): EF 45, normal RVSF, mod to severe TR, mild AS (mean 10 mmHg), trace AI, mild PI, mild MR, small secundum ASD w L-R shunt, RVSP 40   Arthritis    Atrial fibrillation (HCC)    Cancer (HCC)    Chronic kidney disease    Coronary artery disease    NSTEMI 9/14 >> Cath: LM ok, pLAD 50, mLAD 95, pD1 80; LCx/RI/OM1/OM2/LPDA/RCA patent >> PCI: 2.5 x 24 mm Promus DES to mLAD   Diabetes mellitus without complication (HCC)    Diverticulitis    GERD (gastroesophageal reflux disease)    High cholesterol    History of non-ST elevation myocardial infarction in 2014 s/p DES to LAD    Hypertension    Peripheral vascular disease (HCC)     Medications:  Medications Prior to Admission  Medication Sig Dispense Refill Last Dose   acetaminophen (TYLENOL) 650 MG CR tablet Take 1,300 mg by mouth every 8 (eight) hours as needed for pain.      atorvastatin (LIPITOR) 40 MG tablet Take 40 mg by mouth daily.      Calcium Carbonate-Vitamin D (CALCIUM 600 +D HIGH POTENCY) 600-10 MG-MCG TABS Take 1 tablet by mouth daily.       doxycycline (VIBRAMYCIN) 100 MG capsule Take 1 capsule (100 mg total) by mouth 2 (two) times daily. 13 capsule 0    ELIQUIS 5 MG TABS tablet Take 2.5 mg by mouth 2 (two) times daily.      ferrous sulfate 325 (65 FE) MG tablet Take 325 mg by mouth daily with breakfast.      folic acid (FOLVITE) 1 MG tablet Take 1 mg by mouth daily.      nitroGLYCERIN (NITROSTAT) 0.4 MG SL tablet Place 1 tablet (0.4 mg total) under the tongue every 5 (five) minutes as needed for chest pain. 30 tablet 12    nystatin (MYCOSTATIN/NYSTOP) powder Apply topically 3 (three) times daily. 15 g 0    potassium chloride (KLOR-CON M) 10 MEQ tablet Take 1 tablet (10 mEq total) by mouth every other day. 15 tablet 1    timolol (TIMOPTIC) 0.5 % ophthalmic solution Place 1 drop into both eyes 2 (two) times daily.      torsemide (DEMADEX) 10 MG tablet Take 3 tablets (30 mg total) by mouth daily. 90 tablet 1     Assessment: 6 YOM presents to the ED with a cc of fatigue. Malaise, fatigue, SOB progressively worsening over the past 2 weeks. Patient has a history of  AF on Eliquis. Last dose was taken on 04/26/2022 in the morning. Initial troponin is 486 ng/L. Consult initiated by provider  Goal of Therapy:  Heparin level 0.3-0.7 units/ml aPTT 66-102 seconds Monitor platelets by anticoagulation protocol: Yes   Plan:  No bolus Start heparin infusion at 1100 units/hr Check heparin level and aPTT in 8 hours and daily while on heparin Continue aPTT's until HL and aPTT correlate and then monitor via HL only moving forward. Continue to monitor H&H and platelets  Damonica Chopra BS, PharmD, BCPS Clinical Pharmacist 05/01/2022 3:28 AM  Contact: [phone] after 3 PM  "Be curious, not judgmental..." -Jamal Maes

## 2022-05-01 NOTE — Plan of Care (Signed)

## 2022-05-01 NOTE — Progress Notes (Signed)
ANTICOAGULATION CONSULT NOTE - Initial Consult  Pharmacy Consult for heparin infusion Indication: chest pain/ACS and atrial fibrillation  No Known Allergies  Patient Measurements: Height: '5\' 5"'$  (165.1 cm) Weight: 103.5 kg (228 lb 2.8 oz) IBW/kg (Calculated) : 61.5 Heparin Dosing Weight: 86 kg  Vital Signs: Temp: 97.5 F (36.4 C) (09/09 0522) Temp Source: Oral (09/09 0522) BP: 115/79 (09/09 0522) Pulse Rate: 97 (09/09 0522)  Labs: Recent Labs    04/27/2022 1824 05/10/2022 2352 05/01/22 0136 05/01/22 0525 05/01/22 0723 05/01/22 1115  HGB 11.4*  --   --  10.8*  --   --   HCT 37.1*  --   --  34.7*  --   --   PLT 227  --   --  209  --   --   APTT  --   --   --  60*  --  65*  HEPARINUNFRC  --   --   --   --   --  >1.10*  CREATININE 2.34*  --   --   --   --   --   TROPONINIHS  --    < > 486* 920* 1,141*  --    < > = values in this interval not displayed.     Estimated Creatinine Clearance: 24.6 mL/min (A) (by C-G formula based on SCr of 2.34 mg/dL (H)).   Medical History: Past Medical History:  Diagnosis Date   (HFpEF) heart failure with preserved ejection fraction ([place])    Echocardiogram 03/16/16 [facility] in [place]): EF 39, normal RVSF, mod to severe TR, mild AS (mean 10 mmHg), trace AI, mild PI, mild MR, small secundum ASD w L-R shunt, RVSP 40   Arthritis    Atrial fibrillation (HCC)    Cancer (HCC)    Chronic kidney disease    Coronary artery disease    NSTEMI 9/14 >> Cath: LM ok, pLAD 50, mLAD 95, pD1 80; LCx/RI/OM1/OM2/LPDA/RCA patent >> PCI: 2.5 x 24 mm Promus DES to mLAD   Diabetes mellitus without complication (HCC)    Diverticulitis    GERD (gastroesophageal reflux disease)    High cholesterol    History of non-ST elevation myocardial infarction in 2014 s/p DES to LAD    Hypertension    Peripheral vascular disease (HCC)     Medications:  Medications Prior to Admission  Medication Sig Dispense Refill Last Dose   acetaminophen (TYLENOL) 650 MG CR  tablet Take 1,300 mg by mouth every 8 (eight) hours as needed for pain.      atorvastatin (LIPITOR) 40 MG tablet Take 40 mg by mouth daily.      Calcium Carbonate-Vitamin D (CALCIUM 600 +D HIGH POTENCY) 600-10 MG-MCG TABS Take 1 tablet by mouth daily.      doxycycline (VIBRAMYCIN) 100 MG capsule Take 1 capsule (100 mg total) by mouth 2 (two) times daily. 13 capsule 0    ELIQUIS 5 MG TABS tablet Take 2.5 mg by mouth 2 (two) times daily.      ferrous sulfate 325 (65 FE) MG tablet Take 325 mg by mouth daily with breakfast.      folic acid (FOLVITE) 1 MG tablet Take 1 mg by mouth daily.      nitroGLYCERIN (NITROSTAT) 0.4 MG SL tablet Place 1 tablet (0.4 mg total) under the tongue every 5 (five) minutes as needed for chest pain. 30 tablet 12    nystatin (MYCOSTATIN/NYSTOP) powder Apply topically 3 (three) times daily. 15 g 0    potassium  chloride (KLOR-CON M) 10 MEQ tablet Take 1 tablet (10 mEq total) by mouth every other day. 15 tablet 1    timolol (TIMOPTIC) 0.5 % ophthalmic solution Place 1 drop into both eyes 2 (two) times daily.      torsemide (DEMADEX) 10 MG tablet Take 3 tablets (30 mg total) by mouth daily. 90 tablet 1     Assessment: 82 YOM presents to the ED with a cc of fatigue. Malaise, fatigue, SOB progressively worsening over the past 2 weeks. Patient has a history of AF on Eliquis. Last dose was taken on 04/26/2022 in the morning. Initial troponin is 486 ng/L. Consult initiated by provider  aPTT 65 seconds, slightly subtherapeutic HL >1.10 not correlating yet  Goal of Therapy:  Heparin level 0.3-0.7 units/ml aPTT 66-102 seconds Monitor platelets by anticoagulation protocol: Yes   Plan:  Increase heparin infusion to 1200 units/hr Check heparin level and aPTT in 8 hours and daily while on heparin Continue aPTT's until HL and aPTT correlate and then monitor via HL only moving forward. Continue to monitor H&H and platelets  Thomasenia Sales, PharmD, MBA Clinical Pharmacist  "Be  curious, not judgmental..." -Jamal Maes

## 2022-05-01 NOTE — Progress Notes (Signed)
Patient transferred to [facility] via [place].

## 2022-05-01 NOTE — Progress Notes (Signed)
Date and time results received: 05/01/22 9:47 AM   (use smartphrase ".now" to insert current time)  Test: Troponin Critical Value: 1141  Name of Provider Notified: Courage Emokpae face to face  Orders Received? Or Actions Taken?:

## 2022-05-01 NOTE — Progress Notes (Signed)
Report called to [facility].

## 2022-05-01 NOTE — Progress Notes (Signed)
Date and time results received: 05/01/22 0642 (use smartphrase ".now" to insert current time)  Test: Troponin Critical Value: 920  Name of Provider Notified: Dr. Josephine Cables  Orders Received? Or Actions Taken?:  NNO at this time. Patient awaiting transfer to [facility]

## 2022-05-01 NOTE — Progress Notes (Signed)
   05/01/22 0238  Provider Notification  Provider Name/Title Dr. Josephine Cables  Date Provider Notified 05/01/22  Time Provider Notified [phone]  Method of Notification Page  Notification Reason Critical result  Test performed and critical result troponin 486  Date Critical Result Received 05/01/22  Time Critical Result Received 0230

## 2022-05-01 NOTE — H&P (Addendum)
History and Physical    Patient: Lawrence Harper [id number]:[id number] DOB: June 06, 1932 DOA: 05/08/2022 DOS: the patient was seen and examined on 05/01/2022 PCP: System, Provider Not In  Patient coming from: Home  Chief Complaint:  Chief Complaint  Patient presents with   Fatigue   HPI: Lawrence Harper is a 88 y.o. male with medical history significant of essential hypertension, CAD s/p stent placement, chronic atrial fibrillation, CKD stage IV, chronic diastolic CHF who presents to the emergency department accompanied by son due to 2-week onset of increasing shortness of breath and increased fluid retention in the legs with subsequent weeping to right lower leg and abdomen, he complained of difficulty in being able to lay flat in bed due to worsening shortness of breath.  He denies chest pain, fever, chills, nausea, vomiting, abdominal pain.  ED Course:  In the emergency department, he was hemodynamically stable.  Work-up in the ED showed leukocytosis and normocytic anemia.  BMP was normal except for BUN/creatinine 56/2.34 (creatinine is within baseline range), EGFR 26, albumin 3.2, ALP 135, total bilirubin 1.5, magnesium 2.5, BNP 2,619, troponin x 2 - 31 > 486.  Urinalysis was normal. Chest x-ray showed cardiomegaly with small left effusion and vascular calcification Patient was treated with IV lasix 46m x 1, IV fentanyl 50 mcg x1 was given due to leg pain.  Hospitalist was asked to admit.  For further evaluation and management.  Review of Systems: Review of systems as noted in the HPI. All other systems reviewed and are negative.   Past Medical History:  Diagnosis Date   (HFpEF) heart failure with preserved ejection fraction ([place]    [facility] [place] [place] 55, normal RVSF, mod to severe TR, mild AS (mean 10 mmHg), trace AI, mild PI, mild MR, small secundum ASD w L-R shunt, RVSP 40   Arthritis    Atrial fibrillation (HCC)    Cancer (HCC)    Chronic kidney disease     Coronary artery disease    NSTEMI 9/14 >> Cath: LM ok, pLAD 50, mLAD 95, pD1 80; LCx/RI/OM1/OM2/LPDA/RCA patent >> PCI: 2.5 x 24 mm Promus DES to mLAD   Diabetes mellitus without complication (HCC)    Diverticulitis    GERD (gastroesophageal reflux disease)    High cholesterol    History of non-ST elevation myocardial infarction in 2014 s/p DES to LAD    Hypertension    Peripheral vascular disease ([place]    Past Surgical History:  Procedure Laterality Date   CHOLECYSTECTOMY     CORONARY ANGIOPLASTY     EYE SURGERY     KNEE SURGERY Left    KNEE SURGERY     LEFT HEART CATH N/A 05/12/2013   Procedure: LEFT HEART CATH;  Surgeon: JJettie Booze MD;  [facility];  Service: Cardiovascular;  Laterality: N/A;    Social History:  reports that he has never smoked. He has never used smokeless tobacco. He reports that he does not drink alcohol and does not use drugs.   No Known Allergies  Family History  Problem Relation Age of Onset   Throat cancer Mother 662    Prior to Admission medications   Medication Sig Start Date End Date Taking? Authorizing Provider  acetaminophen (TYLENOL) 650 MG CR tablet Take 1,300 mg by mouth every 8 (eight) hours as needed for pain.    [provider]  atorvastatin (LIPITOR) 40 MG tablet Take 40 mg by mouth daily. 07/22/21   [provider]  Calcium Carbonate-Vitamin D (CALCIUM 600 +D HIGH POTENCY) 600-10 MG-MCG TABS Take 1 tablet by mouth daily.    [provider]  doxycycline (VIBRAMYCIN) 100 MG capsule Take 1 capsule (100 mg total) by mouth 2 (two) times daily. 01/04/22   Sherrell Puller, PA-C  ELIQUIS 5 MG TABS tablet Take 2.5 mg by mouth 2 (two) times daily. 06/10/21   [provider]  ferrous sulfate 325 (65 FE) MG tablet Take 325 mg by mouth daily with breakfast.    [provider]  folic acid (FOLVITE) 1 MG tablet Take 1 mg by mouth daily.    [provider]  nitroGLYCERIN  (NITROSTAT) 0.4 MG SL tablet Place 1 tablet (0.4 mg total) under the tongue every 5 (five) minutes as needed for chest pain. 05/13/13   Jerline Pain, MD  nystatin (MYCOSTATIN/NYSTOP) powder Apply topically 3 (three) times daily. 08/17/21   Johnson, Clanford L, MD  potassium chloride (KLOR-CON M) 10 MEQ tablet Take 1 tablet (10 mEq total) by mouth every other day. 08/19/21   Johnson, Clanford L, MD  timolol (TIMOPTIC) 0.5 % ophthalmic solution Place 1 drop into both eyes 2 (two) times daily. 07/22/21   [provider]  torsemide (DEMADEX) 10 MG tablet Take 3 tablets (30 mg total) by mouth daily. 08/18/21   Murlean Iba, MD    Physical Exam: BP 136/73 (BP Location: Right Arm)   Pulse 84   Temp 98.1 F (36.7 C) (Oral)   Resp 18   Ht 5' 6.5" (1.689 m)   Wt 97.1 kg   SpO2 100%   BMI 34.02 kg/m   General: 88 y.o. year-old male well developed well nourished in no acute distress.  Alert and oriented x3. HEENT: NCAT, EOMI Neck: Supple, trachea medial Cardiovascular: Irregular rate and rhythm with no rubs or gallops.  No thyromegaly or JVD noted.  S lower extremity edema ( R > L). 2/4 pulses in all 4 extremities. Respiratory: Mild Rales noted in LLL.no wheezes.  Abdomen: Soft, nontender, but distended with normal bowel sounds x4 quadrants. Muskuloskeletal: Right leg swelling  > left leg.  No cyanosis or clubbing noted bilaterally Neuro: CN II-XII intact, strength 5/5 x 4, sensation, reflexes intact Skin: No ulcerative lesions noted or rashes Psychiatry: Mood is appropriate for condition and setting          Labs on Admission:  Basic Metabolic Panel: Recent Labs  Lab 05/17/2022 1824 05/22/2022 2352  NA 137  --   K 4.9  --   CL 103  --   CO2 25  --   GLUCOSE 96  --   BUN 56*  --   CREATININE 2.34*  --   CALCIUM 9.2  --   MG  --  2.5*   Liver Function Tests: Recent Labs  Lab 05/10/2022 1825  AST 22  ALT 13  ALKPHOS 135*  BILITOT 1.5*  PROT 8.5*  ALBUMIN 3.2*    No results for input(s): "LIPASE", "AMYLASE" in the last 168 hours. No results for input(s): "AMMONIA" in the last 168 hours. CBC: Recent Labs  Lab 05/09/2022 1824  WBC 12.3*  HGB 11.4*  HCT 37.1*  MCV 98.1  PLT 227   Cardiac Enzymes: No results for input(s): "CKTOTAL", "CKMB", "CKMBINDEX", "TROPONINI" in the last 168 hours.  BNP (last 3 results) Recent Labs    08/10/21 1036 05/03/2022 2352  BNP 522.0* 2,619.0*    ProBNP (last 3 results) No results for input(s): "PROBNP" in  the last 8760 hours.  CBG: No results for input(s): "GLUCAP" in the last 168 hours.  Radiological Exams on Admission: DG Chest 2 View  Result Date: 05/04/2022 CLINICAL DATA:  Shortness of breath EXAM: CHEST - 2 VIEW COMPARISON:  08/10/2021 FINDINGS: Cardiomegaly with small left-sided pleural effusion. Vascular congestion. Aortic atherosclerosis. No pneumothorax IMPRESSION: Cardiomegaly with small left effusion and vascular calcification. Electronically Signed   By: Donavan Foil M.D.   On: 05/01/2022 18:30    EKG: I independently viewed the EKG done and my findings are as followed: Atrial fibrillation with rate control with prolonged QTc 474m  Assessment/Plan Present on Admission:  Atrial fibrillation ([place]  CAD (coronary artery disease)  Active Problems:   Essential hypertension   Atrial fibrillation (HCC)   CAD (coronary artery disease)   CKD (chronic kidney disease), stage IV (HCC)   Acute on chronic diastolic CHF (congestive heart failure) (HCC)   Hypermagnesemia   Leukocytosis   Hypoalbuminemia due to protein-calorie malnutrition (HCC)   Obesity (BMI 30-39.9)  Acute on chronic diastolic CHF Elevated BNP BNP 2,619, patient kidney status may be a contributing factor to the elevated BNP Patient presents with lower extremity edema and increased abdominal girth Continue total input/output, daily weights and fluid restriction Continue IV Lasix 40 mg x 1 was given, continue IV Lasix 40 mg  twice daily Continue Cardiac diet  Echocardiogram done on 08/11/2021 showed LVEF of 65 to 70%.  No RWMA.  Mild LVH.  LV diastolic parameters are indeterminate.  RV systolic function is severely reduced.  RV size is moderately enlarged.  Mildly elevated PASP.  Echocardiogram will be done in the morning   Elevated troponin possibly secondary to NSTEMI  Troponin x 2 -31 > 486 No EKG changes, patient denies any chest pain at this time Patient was started on IV heparin drip Continue telemetry Continue to trend troponin Cardiology was consulted and [facility] no [facility] here at [facility] on weekends  Chronic atrial fibrillation EKG personally reviewed showed A-fib with rate controlled with prolonged QTc of 492 ms Continue telemetry Continue heparin drip No rate control medication noted in patient's med rec  Right leg swelling > left leg swelling Lower extremity ultrasound will be checked to rule out DVT  Hypermagnesemia Mg 2.5, no EKG changes Continue to monitor magnesium level with morning labs  Leukocytosis possibly reactive WBC 12.3, no obvious sign of any acute infectious process at this time Continue to monitor WBC with morning labs  CAD s/p stent placement Stable, continue Lipitor, nitroglycerin as needed  Essential hypertension (controlled) Continue Lasix  Hypoalbuminemia possibly secondary to mild protein calorie malnutrition Albumin 3.2; protein supplement to be provided  CKD stage IV BUN/creatinine 56/2.34 (creatinine is within baseline range) Renally adjust medications, avoid nephrotoxic agents/dehydration/hypotension  Obesity (BMI 34.02) Diet and lifestyle modification  DVT prophylaxis: Heparin drip  Code Status: DNR  Family Communication: Son at bedside (all questions answered to satisfaction)  Severity of Illness: The appropriate patient status for this patient is INPATIENT. Inpatient status is judged to be reasonable and  necessary in order to provide the required intensity of service to ensure the patient's safety. The patient's presenting symptoms, physical exam findings, and initial radiographic and laboratory data in the context of their chronic comorbidities is felt to place them at high risk for further clinical deterioration. Furthermore, it is not anticipated that the patient will be medically stable for discharge from the hospital within 2 midnights of admission.   *  I certify that at the point of admission it is my clinical judgment that the patient will require inpatient hospital care spanning beyond 2 midnights from the point of admission due to high intensity of service, high risk for further deterioration and high frequency of surveillance required.*  Author: Bernadette Hoit, DO 05/01/2022 3:51 AM  For on call review [email or web address].

## 2022-05-01 NOTE — Progress Notes (Signed)
Date and time results received: 05/01/22 1740 Test: troponin Critical Value: 1314 Name of Provider Notified: Courage Emokpae Orders Received? Or Actions Taken? Waiting on [facility] for transport to [facility]. Deirdre Pippins, RN

## 2022-05-01 NOTE — Progress Notes (Signed)
Attempted to call son to notify of order to transfer to [facility]. Unable to reach him, left a VM just asking for a return call.

## 2022-05-01 NOTE — TOC Progression Note (Signed)
  Transition of Care (TOC) Screening Note   Patient Details  Name: Lawrence Harper Date of Birth: 1932/02/21   Transition of Care [facility]) CM/SW Contact:    Boneta Lucks, RN Phone Number: 05/01/2022, 9:19 AM    Transition of Care Department [facility]) has reviewed patient and no TOC needs have been identified at this time. We will continue to monitor patient advancement through interdisciplinary progression rounds. If new patient transition needs arise, please place a TOC consult.       Barriers to Discharge: Continued Medical Work up  Expected Discharge Plan and Services         Living arrangements for the past 2 months: [place]

## 2022-05-01 NOTE — Progress Notes (Signed)
Pharmacy Antibiotic Note  Lawrence Harper a 88 y.o. male admitted on 05/01/2022 with cellulitis.  Pharmacy has been consulted for vancomycin dosing.  Plan: vancomycin 2gm x 1  Vancomycin '1250mg'$  IV every 48 hours.  Goal trough 10-15 mcg/mL.  Medical History: Past Medical History:  Diagnosis Date   (HFpEF) heart failure with preserved ejection fraction (HCC)    Echocardiogram 03/16/16 [facility] in [place]): EF 55, normal RVSF, mod to severe TR, mild AS (mean 10 mmHg), trace AI, mild PI, mild MR, small secundum ASD w L-R shunt, RVSP 40   Arthritis    Atrial fibrillation (HCC)    Cancer (HCC)    Chronic kidney disease    Coronary artery disease    NSTEMI 9/14 >> Cath: LM ok, pLAD 50, mLAD 95, pD1 80; LCx/RI/OM1/OM2/LPDA/RCA patent >> PCI: 2.5 x 24 mm Promus DES to mLAD   Diabetes mellitus without complication (HCC)    Diverticulitis    GERD (gastroesophageal reflux disease)    High cholesterol    History of non-ST elevation myocardial infarction in 2014 s/p DES to LAD    Hypertension    Peripheral vascular disease ([place])     Allergies:  No Known Allergies  Filed Weights   05/19/2022 1749 04/29/2022 2230 05/01/22 0500  Weight: 97.1 kg (214 lb) 103.1 kg (227 lb 4.7 oz) 103.5 kg (228 lb 2.8 oz)       Latest Ref Rng & Units 05/01/2022    5:25 AM 05/18/2022    6:24 PM 01/04/2022   11:49 AM  CBC  WBC 4.0 - 10.5 K/uL 14.7  12.3  6.2   Hemoglobin 13.0 - 17.0 g/dL 10.8  11.4  9.7   Hematocrit 39.0 - 52.0 % 34.7  37.1  31.1   Platelets 150 - 400 K/uL 209  227  221      Estimated Creatinine Clearance: 24.6 mL/min (A) (by C-G formula based on SCr of 2.34 mg/dL (H)).  Antibiotics Given (last 72 hours)     None       Antimicrobials this admission:  vancomycin 05/01/2022  >>   Microbiology results: 05/01/2022  Resp Panel: negative   Thank you for allowing pharmacy to be a part of this patient's care.  Thomasenia Sales, PharmD Clinical Pharmacist

## 2022-05-01 NOTE — Progress Notes (Signed)
Echocardiogram 2D Echocardiogram has been performed.  Oneal Deputy Amyriah Buras RDCS 05/01/2022, 12:36 PM

## 2022-05-02 DIAGNOSIS — I482 Chronic atrial fibrillation, unspecified: Secondary | ICD-10-CM | POA: Diagnosis not present

## 2022-05-02 DIAGNOSIS — N189 Chronic kidney disease, unspecified: Secondary | ICD-10-CM

## 2022-05-02 DIAGNOSIS — N179 Acute kidney failure, unspecified: Secondary | ICD-10-CM

## 2022-05-02 DIAGNOSIS — I10 Essential (primary) hypertension: Secondary | ICD-10-CM | POA: Diagnosis not present

## 2022-05-02 DIAGNOSIS — I361 Nonrheumatic tricuspid (valve) insufficiency: Secondary | ICD-10-CM

## 2022-05-02 DIAGNOSIS — I350 Nonrheumatic aortic (valve) stenosis: Secondary | ICD-10-CM

## 2022-05-02 DIAGNOSIS — N184 Chronic kidney disease, stage 4 (severe): Secondary | ICD-10-CM

## 2022-05-02 DIAGNOSIS — I214 Non-ST elevation (NSTEMI) myocardial infarction: Secondary | ICD-10-CM

## 2022-05-02 DIAGNOSIS — I5033 Acute on chronic diastolic (congestive) heart failure: Secondary | ICD-10-CM | POA: Diagnosis not present

## 2022-05-02 DIAGNOSIS — I4821 Permanent atrial fibrillation: Secondary | ICD-10-CM

## 2022-05-02 DIAGNOSIS — I5032 Chronic diastolic (congestive) heart failure: Secondary | ICD-10-CM

## 2022-05-02 LAB — CBC
HCT: 32 % — ABNORMAL LOW (ref 39.0–52.0)
Hemoglobin: 10.3 g/dL — ABNORMAL LOW (ref 13.0–17.0)
MCH: 31.4 pg (ref 26.0–34.0)
MCHC: 32.2 g/dL (ref 30.0–36.0)
MCV: 97.6 fL (ref 80.0–100.0)
Platelets: 206 10*3/uL (ref 150–400)
RBC: 3.28 MIL/uL — ABNORMAL LOW (ref 4.22–5.81)
RDW: 19.4 % — ABNORMAL HIGH (ref 11.5–15.5)
WBC: 10.3 10*3/uL (ref 4.0–10.5)
nRBC: 0 % (ref 0.0–0.2)

## 2022-05-02 LAB — APTT
aPTT: 49 seconds — ABNORMAL HIGH (ref 24–36)
aPTT: 72 seconds — ABNORMAL HIGH (ref 24–36)

## 2022-05-02 LAB — BASIC METABOLIC PANEL
Anion gap: 8 (ref 5–15)
BUN: 66 mg/dL — ABNORMAL HIGH (ref 8–23)
CO2: 26 mmol/L (ref 22–32)
Calcium: 8.5 mg/dL — ABNORMAL LOW (ref 8.9–10.3)
Chloride: 101 mmol/L (ref 98–111)
Creatinine, Ser: 2.6 mg/dL — ABNORMAL HIGH (ref 0.61–1.24)
GFR, Estimated: 23 mL/min — ABNORMAL LOW (ref >60)
Glucose, Bld: 120 mg/dL — ABNORMAL HIGH (ref 70–99)
Potassium: 4.5 mmol/L (ref 3.5–5.1)
Sodium: 135 mmol/L (ref 135–145)

## 2022-05-02 LAB — HEMOGLOBIN A1C
Hgb A1c MFr Bld: 5.9 % — ABNORMAL HIGH (ref 4.8–5.6)
Mean Plasma Glucose: 122.63 mg/dL

## 2022-05-02 LAB — GLUCOSE, CAPILLARY
Glucose-Capillary: 106 mg/dL — ABNORMAL HIGH (ref 70–99)
Glucose-Capillary: 108 mg/dL — ABNORMAL HIGH (ref 70–99)
Glucose-Capillary: 118 mg/dL — ABNORMAL HIGH (ref 70–99)
Glucose-Capillary: 106 mg/dL — ABNORMAL HIGH (ref 70–99)
Glucose-Capillary: 111 mg/dL — ABNORMAL HIGH (ref 70–99)
Glucose-Capillary: 120 mg/dL — ABNORMAL HIGH (ref 70–99)

## 2022-05-02 LAB — HEPARIN LEVEL (UNFRACTIONATED): Heparin Unfractionated: 1.07 IU/mL — ABNORMAL HIGH (ref 0.30–0.70)

## 2022-05-02 MED ORDER — FUROSEMIDE 10 MG/ML IJ SOLN
80.0000 mg | Freq: Two times a day (BID) | INTRAMUSCULAR | Status: DC
  Administered 2022-05-02 – 2022-05-05 (×6): 80 mg via INTRAVENOUS
  Filled 2022-05-02 (×6): qty 8

## 2022-05-02 MED ORDER — IPRATROPIUM-ALBUTEROL 0.5-2.5 (3) MG/3ML IN SOLN
3.0000 mL | Freq: Four times a day (QID) | RESPIRATORY_TRACT | Status: DC
  Administered 2022-05-02: 3 mL via RESPIRATORY_TRACT
  Filled 2022-05-02 (×2): qty 3

## 2022-05-02 MED ORDER — IPRATROPIUM-ALBUTEROL 0.5-2.5 (3) MG/3ML IN SOLN
3.0000 mL | RESPIRATORY_TRACT | Status: DC | PRN
Start: 2022-05-02 — End: 2022-05-07
  Administered 2022-05-02 – 2022-05-05 (×6): 3 mL via RESPIRATORY_TRACT
  Filled 2022-05-02 (×4): qty 3

## 2022-05-02 MED ORDER — GERHARDT'S BUTT CREAM
TOPICAL_CREAM | Freq: Four times a day (QID) | CUTANEOUS | Status: AC
  Administered 2022-05-03: 1 via TOPICAL
  Filled 2022-05-02: qty 1

## 2022-05-02 MED ORDER — FUROSEMIDE 10 MG/ML IJ SOLN
40.0000 mg | Freq: Once | INTRAMUSCULAR | Status: AC
  Administered 2022-05-02: 40 mg via INTRAVENOUS
  Filled 2022-05-02: qty 4

## 2022-05-02 MED ORDER — IPRATROPIUM-ALBUTEROL 0.5-2.5 (3) MG/3ML IN SOLN: 3.0000 mL | Freq: Four times a day (QID) | RESPIRATORY_TRACT | Status: DC

## 2022-05-02 NOTE — Progress Notes (Addendum)
Progress Note   Patient: Lawrence Harper [id number]:[id number] DOB: June 22, 1932 DOA: 05/19/2022     1 DOS: the patient was seen and examined on 05/02/2022   Brief hospital course: [name] was admitted to the hospital with the working diagnosis of decompensated heart failure.   88 yo male with the past medical history of hypertension, coronary artery disease, atrial fibrillation, CKD stage IV and chronic diastolic heart failure who presented with fatigue. Reported 2 weeks of dyspnea, lower extremity edema, increased abdominal girth and orthopnea. On his initial physical examination his blood pressure was 136/73, HR 84, RR 18 and 02 saturation 100%, lungs with rales bilaterally, with no wheezing, heart with S1 and S2 present irregularly irregular, abdomen not distended, positive lower extremity edema.   Na 137, K 4,9, Cl 103, bicarbonate 25, glucose 96 bun 56 cr 2,34  ALK P 135, T bil 1,5  BNP 2,619  High sensitive troponin 31 and 486  Wbc 12.3 hgb 11.4 plt 227  Sars covid 19 negative   Urine analysis with SG 1,013, 30 protein, 0-5 wbc   Chest radiograph with cardiomegaly and mild bilateral hilar vascular congestion, no infiltrates.   EKG 90 bpm, normal axis, right bundle branch block, atrial fibrillation rhythm with small q V1 to V3, with no significant ST segment or T wave changes (old changes).   Patient was placed on furosemide for diuresis.  He was placed on heparin for NSTEMI.       Assessment and Plan: * Acute on chronic diastolic CHF/HFpEF Echocardiogram with preserved LV systolic function 50 to 34%V RV systolic function with severe reduction, RV severe cavity enlargement, RVSP 46,6 mmHg, RA with severe dilatation, severe TR, mild to moderate aortic stenosis,   Acute on chronic core pulmonale Pulmonary hypertension,   Urine output is 4,259 ml  Systolic blood pressure 563 to 114 mmHg.  Plan to continue diuresis with furosemide Continue with metoprolol.  Continue blood pressure  monitoring. Patient with severe RV failure, poor prognosis.   Acute hypoxemic respiratory failure. (present on admission) Continue diuresis with supplemental 02 per Anton Add bronchodilator therapy scheduled and as needed.   NSTEMI (non-ST elevated myocardial infarction)  Coronary artery disease.  Plan to continue medical therapy with heparin for anticoagulation Continue metoprolol, aspirin and atorvastatin.  Patient with no current chest pain.   Acute kidney injury superimposed on chronic kidney disease ([place]) CKD stage IV.   Renal function with serum cr at 2,60 with K at 4,5 and serum bicarbonate at 26 Plan to continue diuresis with furosemide Follow up renal function and electrolytes in am.   Chronic anemia, due to chronic renal disease, Iron deficiency hgb has been stable, continue with oral iron supplementation.    Essential hypertension Continue with metoprolol, and diuresis with furosemide.   Atrial fibrillation ([place]) Continue rate control with metoprolol and anticoagulation with heparin Patient has been on apixaban at home.   Sepsis due to Rt Leg Cellulitis  Leg with signs of venous stasis Doubt cellulitis. Wbc is down to 10,3.   Plan to discontinue antibiotic therapy Sepsis and cellulitis ruled out as this point.    Hypoalbuminemia due to protein-calorie malnutrition ([place]) Continue with nutritional supplements.   Obesity (BMI 30-39.9) Calculated BMI is 37,9         Subjective: Patient continue to have dyspnea, he has been somnolent, easy to arouse, no chest pain, no nausea   Physical Exam: Vitals:   05/02/22 0437 05/02/22 0803 05/02/22 0832 05/02/22 1133  BP:  Marland Kitchen)  109/97  114/80  Pulse:   71 75  Resp:  20  18  Temp: 99.1 F (37.3 C) 98.7 F (37.1 C)  98.5 F (36.9 C)  TempSrc:  Oral  Oral  SpO2:    100%  Weight: 103.4 kg     Height:       Neurology somnolent but easy to arouse, able to follow commands and answer simple questions ENT with  mild pallor Cardiovascular with S1 and S2 present and rhythmic with positive murmur at the left lower sternal border, systolic Mild JVD Positive pitting lower extremity edema ++ pitting  Respiratory with no mild rales upper proximal wheezing on expiration, no rhonchi Abdomen with mild distention  Data Reviewed:    Family Communication: no family at the bedside 2  Disposition: Status is: Inpatient Remains inpatient appropriate because: heart failure   Planned Discharge Destination: Home     Author: Tawni Millers, MD 05/02/2022 1:57 PM  For on call review [email or web address].

## 2022-05-02 NOTE — Progress Notes (Signed)
[place] for heparin infusion Indication: chest pain/ACS and atrial fibrillation  No Known Allergies  Patient Measurements: Height: '5\' 5"'$  (165.1 cm) Weight: 103.4 kg (227 lb 15.3 oz) IBW/kg (Calculated) : 61.5 Heparin Dosing Weight: 86 kg  Vital Signs: Temp: 98.7 F (37.1 C) (09/10 0803) Temp Source: Oral (09/10 0803) BP: 109/97 (09/10 0803) Pulse Rate: 71 (09/10 0832)  Labs: Recent Labs    05/05/2022 1824 05/15/2022 2352 05/01/22 0525 05/01/22 0723 05/01/22 1115 05/01/22 1652 05/02/22 0126 05/02/22 0916  HGB 11.4*  --  10.8*  --   --   --   --   --   HCT 37.1*  --  34.7*  --   --   --   --   --   PLT 227  --  209  --   --   --   --   --   APTT  --    < > 60*  --  65*  --  49* 72*  HEPARINUNFRC  --   --   --   --  >1.10*  --   --  1.07*  CREATININE 2.34*  --   --   --   --   --   --  2.60*  TROPONINIHS  --    < > 920* 1,141*  --  1,314*  --   --    < > = values in this interval not displayed.     Estimated Creatinine Clearance: 22.2 mL/min (A) (by C-G formula based on SCr of 2.6 mg/dL (H)).   Medical History: Past Medical History:  Diagnosis Date   (HFpEF) heart failure with preserved ejection fraction ([place])    Echocardiogram 03/16/16 [facility] in [place]): EF 17, normal RVSF, mod to severe TR, mild AS (mean 10 mmHg), trace AI, mild PI, mild MR, small secundum ASD w L-R shunt, RVSP 40   Arthritis    Atrial fibrillation (HCC)    Cancer (HCC)    Chronic kidney disease    Coronary artery disease    NSTEMI 9/14 >> Cath: LM ok, pLAD 50, mLAD 95, pD1 80; LCx/RI/OM1/OM2/LPDA/RCA patent >> PCI: 2.5 x 24 mm Promus DES to mLAD   Diabetes mellitus without complication (HCC)    Diverticulitis    GERD (gastroesophageal reflux disease)    High cholesterol    History of non-ST elevation myocardial infarction in 2014 s/p DES to LAD    Hypertension    Peripheral vascular disease (HCC)     Medications:  Medications Prior to  Admission  Medication Sig Dispense Refill Last Dose   acetaminophen (TYLENOL) 650 MG CR tablet Take 1,300 mg by mouth every 8 (eight) hours as needed for pain.      atorvastatin (LIPITOR) 40 MG tablet Take 40 mg by mouth daily.      Calcium Carbonate-Vitamin D (CALCIUM 600 +D HIGH POTENCY) 600-10 MG-MCG TABS Take 1 tablet by mouth daily.      doxycycline (VIBRAMYCIN) 100 MG capsule Take 1 capsule (100 mg total) by mouth 2 (two) times daily. 13 capsule 0    ELIQUIS 5 MG TABS tablet Take 2.5 mg by mouth 2 (two) times daily.      ferrous sulfate 325 (65 FE) MG tablet Take 325 mg by mouth daily with breakfast.      folic acid (FOLVITE) 1 MG tablet Take 1 mg by mouth daily.      nitroGLYCERIN (NITROSTAT) 0.4 MG SL tablet Place 1 tablet (0.4  mg total) under the tongue every 5 (five) minutes as needed for chest pain. 30 tablet 12    nystatin (MYCOSTATIN/NYSTOP) powder Apply topically 3 (three) times daily. 15 g 0    potassium chloride (KLOR-CON M) 10 MEQ tablet Take 1 tablet (10 mEq total) by mouth every other day. 15 tablet 1    timolol (TIMOPTIC) 0.5 % ophthalmic solution Place 1 drop into both eyes 2 (two) times daily.      torsemide (DEMADEX) 10 MG tablet Take 3 tablets (30 mg total) by mouth daily. 90 tablet 1     Assessment: 23 YOM presents to the ED with a cc of fatigue. Malaise, fatigue, SOB progressively worsening over the past 2 weeks. Patient has a history of AF on Eliquis. Last dose was taken on 05/21/2022 in the morning. Initial troponin is 486 ng/L. Consult initiated by provider  Heparin level 1.07, aPTT is therapeutic at 72. CBC stable. No issues with infusion reported, no signs or symptoms of bleeding noted.   Goal of Therapy:  Heparin level 0.3-0.7 units/ml aPTT 66-102 seconds Monitor platelets by anticoagulation protocol: Yes   Plan:  Continue heparin gtt at 1400 units/hr Check aPTT and Heparin level with AM labs Monitor for signs and symptoms of bleeding  Louanne Belton,  PharmD, [facility] PGY1 Pharmacy Resident 05/02/2022 10:21 AM

## 2022-05-02 NOTE — Progress Notes (Signed)
ANTICOAGULATION CONSULT NOTE  Pharmacy Consult for heparin infusion Indication: chest pain/ACS and atrial fibrillation  No Known Allergies  Patient Measurements: Height: '5\' 5"'$  (165.1 cm) Weight: 104.3 kg (229 lb 15 oz) IBW/kg (Calculated) : 61.5 Heparin Dosing Weight: 86 kg  Vital Signs: Temp: 99.3 F (37.4 C) (09/10 0118) Temp Source: Oral (09/10 0118) BP: 124/74 (09/10 0118) Pulse Rate: 74 (09/10 0118)  Labs: Recent Labs    05/09/2022 1824 04/29/2022 2352 05/01/22 0525 05/01/22 0723 05/01/22 1115 05/01/22 1652 05/02/22 0126  HGB 11.4*  --  10.8*  --   --   --   --   HCT 37.1*  --  34.7*  --   --   --   --   PLT 227  --  209  --   --   --   --   APTT  --   --  60*  --  65*  --  49*  HEPARINUNFRC  --   --   --   --  >1.10*  --   --   CREATININE 2.34*  --   --   --   --   --   --   TROPONINIHS  --    < > 920* 1,141*  --  1,314*  --    < > = values in this interval not displayed.     Estimated Creatinine Clearance: 24.7 mL/min (A) (by C-G formula based on SCr of 2.34 mg/dL (H)).   Medical History: Past Medical History:  Diagnosis Date   (HFpEF) heart failure with preserved ejection fraction ([place])    Echocardiogram 03/16/16 [facility] in [place]): EF 19, normal RVSF, mod to severe TR, mild AS (mean 10 mmHg), trace AI, mild PI, mild MR, small secundum ASD w L-R shunt, RVSP 40   Arthritis    Atrial fibrillation (HCC)    Cancer (HCC)    Chronic kidney disease    Coronary artery disease    NSTEMI 9/14 >> Cath: LM ok, pLAD 50, mLAD 95, pD1 80; LCx/RI/OM1/OM2/LPDA/RCA patent >> PCI: 2.5 x 24 mm Promus DES to mLAD   Diabetes mellitus without complication (HCC)    Diverticulitis    GERD (gastroesophageal reflux disease)    High cholesterol    History of non-ST elevation myocardial infarction in 2014 s/p DES to LAD    Hypertension    Peripheral vascular disease (HCC)     Medications:  Medications Prior to Admission  Medication Sig Dispense Refill Last Dose    acetaminophen (TYLENOL) 650 MG CR tablet Take 1,300 mg by mouth every 8 (eight) hours as needed for pain.      atorvastatin (LIPITOR) 40 MG tablet Take 40 mg by mouth daily.      Calcium Carbonate-Vitamin D (CALCIUM 600 +D HIGH POTENCY) 600-10 MG-MCG TABS Take 1 tablet by mouth daily.      doxycycline (VIBRAMYCIN) 100 MG capsule Take 1 capsule (100 mg total) by mouth 2 (two) times daily. 13 capsule 0    ELIQUIS 5 MG TABS tablet Take 2.5 mg by mouth 2 (two) times daily.      ferrous sulfate 325 (65 FE) MG tablet Take 325 mg by mouth daily with breakfast.      folic acid (FOLVITE) 1 MG tablet Take 1 mg by mouth daily.      nitroGLYCERIN (NITROSTAT) 0.4 MG SL tablet Place 1 tablet (0.4 mg total) under the tongue every 5 (five) minutes as needed for chest pain. 30 tablet 12  nystatin (MYCOSTATIN/NYSTOP) powder Apply topically 3 (three) times daily. 15 g 0    potassium chloride (KLOR-CON M) 10 MEQ tablet Take 1 tablet (10 mEq total) by mouth every other day. 15 tablet 1    timolol (TIMOPTIC) 0.5 % ophthalmic solution Place 1 drop into both eyes 2 (two) times daily.      torsemide (DEMADEX) 10 MG tablet Take 3 tablets (30 mg total) by mouth daily. 90 tablet 1     Assessment: 63 YOM presents to the ED with a cc of fatigue. Malaise, fatigue, SOB progressively worsening over the past 2 weeks. Patient has a history of AF on Eliquis. Last dose was taken on 05/01/2022 in the morning. Initial troponin is 486 ng/L. Consult initiated by provider  Repeat aPTT is subtherapeutic at 49 seconds.  Goal of Therapy:  Heparin level 0.3-0.7 units/ml aPTT 66-102 seconds Monitor platelets by anticoagulation protocol: Yes   Plan:  Increase heparin to 1400 units/h Recheck aPTT/heparin level in 8h  Arrie Senate, PharmD, [place], [facility] Clinical Pharmacist Please check AMION for all [facility] Pharmacy numbers 05/02/2022

## 2022-05-02 NOTE — Assessment & Plan Note (Signed)
Continue rate control with metoprolol and anticoagulation with heparin Patient has been on apixaban at home.

## 2022-05-02 NOTE — Consult Note (Signed)
Cardiology Consultation   Patient ID: Lawrence Harper MRN: [id number]; DOB: 03/24/1932  Admit date: 05/09/2022 Date of Consult: 05/02/2022  PCP:  System, Provider Not In   [place] Providers Cardiologist:  None        Patient Profile:   Lawrence Harper is a 88 y.o. male with a hx of essential hypertension, CAD s/p stent placement, chronic atrial fibrillation, CKD stage IV, chronic diastolic CHF  who is being seen 05/02/2022 for the evaluation of NSTEMI at the request of Dr. Cathlean Sauer  History of Present Illness:   Lawrence Harper is a 88 y.o. male with a hx of essential hypertension, CAD s/p stent placement in 2014 to LAD, chronic atrial fibrillation, CKD stage IV, chronic diastolic CHF  who is being seen 05/02/2022 for the evaluation of NSTEMI at the request of Dr. Cathlean Sauer. Patient was admitted to the hospitalist service for concerns of ADHF and cellulitis for which he was started on antibiotics. He has been having SOB since 2 weeks with fullness in abdomen and weeping legs. His initial troponin was 31 but went upto 486 and then >1000, hence cardiology was consulted. He was subsequently transferred to [facility] for further cardiology evaluation. He currently denies any CP but has SOB. He is being treated with IV lasix for ADHF by the hospitalist. His BNP is >2K. Of note he has severe CKD with Cr of 2.5. He has already been started on IV heparin for concern of NSTEMI and Afib.  Echo showed severe TR Mild to moderate aortic stenosis. Lower than expected gradient due to SVI 26, consistent with paradoxical low flow low gradient mild to moderate  aortic stenosis   Past Medical History:  Diagnosis Date   (HFpEF) heart failure with preserved ejection fraction ([place])    Echocardiogram 03/16/16 [facility] in [place]): EF 31, normal RVSF, mod to severe TR, mild AS (mean 10 mmHg), trace AI, mild PI, mild MR, small secundum ASD w L-R shunt, RVSP 40   Arthritis    Atrial fibrillation (HCC)    Cancer  (HCC)    Chronic kidney disease    Coronary artery disease    NSTEMI 9/14 >> Cath: LM ok, pLAD 50, mLAD 95, pD1 80; LCx/RI/OM1/OM2/LPDA/RCA patent >> PCI: 2.5 x 24 mm Promus DES to mLAD   Diabetes mellitus without complication (HCC)    Diverticulitis    GERD (gastroesophageal reflux disease)    High cholesterol    History of non-ST elevation myocardial infarction in 2014 s/p DES to LAD    Hypertension    Peripheral vascular disease ([place])     Past Surgical History:  Procedure Laterality Date   CHOLECYSTECTOMY     CORONARY ANGIOPLASTY     EYE SURGERY     KNEE SURGERY Left    KNEE SURGERY     LEFT HEART CATH N/A 05/12/2013   Procedure: LEFT HEART CATH;  Surgeon: Jettie Booze, MD;  Location: [facility] CATH LAB;  Service: Cardiovascular;  Laterality: N/A;       Inpatient Medications: Scheduled Meds:  aspirin EC  81 mg Oral Q breakfast   atorvastatin  40 mg Oral Daily   calcium-vitamin D  1 tablet Oral Q breakfast   feeding supplement  237 mL Oral BID BM   ferrous sulfate  325 mg Oral Q breakfast   folic acid  1 mg Oral Daily   furosemide  40 mg Intravenous Q12H   influenza vaccine adjuvanted  0.5 mL Intramuscular Tomorrow-1000  metoprolol tartrate  12.5 mg Oral BID   potassium chloride  10 mEq Oral QODAY   timolol  1 drop Both Eyes BID   Continuous Infusions:  heparin 1,200 Units/hr (05/02/22 0002)   [START ON 05/03/2022] vancomycin     PRN Meds:   Allergies:   No Known Allergies  Social History:   Social History   Socioeconomic History   Marital status: Married    Spouse name: Not on file   Number of children: Not on file   Years of education: Not on file   Highest education level: Not on file  Occupational History   Not on file  Tobacco Use   Smoking status: Never   Smokeless tobacco: Never  Vaping Use   Vaping Use: Never used  Substance and Sexual Activity   Alcohol use: No   Drug use: No   Sexual activity: Not on file  Other Topics Concern   Not on  file  Social History Narrative   Not on file   Social Determinants of Health   Financial Resource Strain: Not on file  Food Insecurity: No Food Insecurity (05/01/2022)   Hunger Vital Sign    Worried About Running Out of Food in the Last Year: Never true    Ran Out of Food in the Last Year: Never true  Transportation Needs: No Transportation Needs (05/01/2022)   PRAPARE - Hydrologist (Medical): No    Lack of Transportation (Non-Medical): No  Physical Activity: Not on file  Stress: Not on file  Social Connections: Not on file  Intimate Partner Violence: Not At Risk (05/01/2022)   Humiliation, Afraid, Rape, and Kick questionnaire    Fear of Current or Ex-Partner: No    Emotionally Abused: No    Physically Abused: No    Sexually Abused: No    Family History:    Family History  Problem Relation Age of Onset   Throat cancer Mother 3     ROS:  Please see the history of present illness.  All other ROS reviewed and negative.     Physical Exam/Data:   Vitals:   05/01/22 1308 05/01/22 2047 05/01/22 2127 05/02/22 0118  BP: 116/72  (!) 142/93 124/74  Pulse: 96 99 (!) 107 74  Resp: 18 20  (!) 21  Temp: 97.6 F (36.4 C) 98.7 F (37.1 C)  99.3 F (37.4 C)  TempSrc: Oral Oral  Oral  SpO2: 93%   94%  Weight:  104.3 kg    Height:        Intake/Output Summary (Last 24 hours) at 05/02/2022 0223 Last data filed at 05/01/2022 2323 Gross per 24 hour  Intake 942.63 ml  Output 1550 ml  Net -607.37 ml      05/01/2022    8:47 PM 05/01/2022    5:00 AM 04/27/2022   10:30 PM  Last 3 Weights  Weight (lbs) 229 lb 15 oz 228 lb 2.8 oz 227 lb 4.7 oz  Weight (kg) 104.3 kg 103.5 kg 103.1 kg     Body mass index is 38.26 kg/m.  General:  Well nourished, well developed, in mild distress HEENT: normal Neck: Elevated JVD Vascular: No carotid bruits; Distal pulses 2+ bilaterally Cardiac:  normal S1, S2; RRR; systolic harsh murmur heard Lungs: Poor airflow with crackles  at bases Abd: soft, tight Ext: Severe edema present Musculoskeletal:  No deformities, BUE and BLE strength normal and equal Skin: warm and dry  Neuro:  CNs 2-12 intact,  no focal abnormalities noted Psych:  Normal affect   EKG:  The EKG was personally reviewed and demonstrates: afib, RBBB, no acute ischemic changes  Relevant CV Studies:  Echo: LVE 50-55%, cannot eval WMAs, severe RV enlargement and dysfunction with D shaped septum, PASP 47, severe TR Mild to moderate aortic stenosis. Lower than expected gradient due to SVI 26, consistent with paradoxical low flow low gradient mild to moderate  aortic stenosis  Laboratory Data:  High Sensitivity Troponin:   Recent Labs  Lab 05/19/2022 2352 05/01/22 0136 05/01/22 0525 05/01/22 0723 05/01/22 1652  TROPONINIHS 31* 486* 920* 1,141* 1,314*     Chemistry Recent Labs  Lab 04/29/2022 1824 05/22/2022 2352  NA 137  --   K 4.9  --   CL 103  --   CO2 25  --   GLUCOSE 96  --   BUN 56*  --   CREATININE 2.34*  --   CALCIUM 9.2  --   MG  --  2.5*  GFRNONAA 26*  --   ANIONGAP 9  --     Recent Labs  Lab 05/18/2022 1825  PROT 8.5*  ALBUMIN 3.2*  AST 22  ALT 13  ALKPHOS 135*  BILITOT 1.5*   Lipids No results for input(s): "CHOL", "TRIG", "HDL", "LABVLDL", "LDLCALC", "CHOLHDL" in the last 168 hours.  Hematology Recent Labs  Lab 05/11/2022 1824 05/01/22 0525  WBC 12.3* 14.7*  RBC 3.78* 3.52*  HGB 11.4* 10.8*  HCT 37.1* 34.7*  MCV 98.1 98.6  MCH 30.2 30.7  MCHC 30.7 31.1  RDW 19.2* 19.6*  PLT 227 209   Thyroid No results for input(s): "TSH", "FREET4" in the last 168 hours.  BNP Recent Labs  Lab 04/29/2022 2352  BNP 2,619.0*    DDimer No results for input(s): "DDIMER" in the last 168 hours.   Radiology/Studies:  ECHOCARDIOGRAM COMPLETE  Result Date: 05/01/2022    ECHOCARDIOGRAM REPORT   Patient Name:   Lawrence Harper Date of Exam: 05/01/2022 Medical Rec #:  [id number]      Height:       65.0 in Accession #:    [id number]      Weight:       228.2 lb Date of Birth:  11/23/1931     BSA:          2.092 m Patient Age:    71 years       BP:           115/79 mmHg Patient Gender: M              HR:           73 bpm. Exam Location:  [facility] Procedure: 2D Echo, Color Doppler, Cardiac Doppler and Intracardiac            Opacification Agent Indications:    I77.82 Acute diastolic (congestive) heart failure  History:        Patient has prior history of Echocardiogram examinations, most                 recent 08/11/2021. CHF, Arrythmias:Atrial Fibrillation; Risk                 Factors:Hypertension, Diabetes and Dyslipidemia.  Sonographer:    Raquel Sarna Senior RDCS Referring Phys: [id number] OLADAPO ADEFESO  Sonographer Comments: Poor apical windows due to lung interference. IMPRESSIONS  1. Left ventricular ejection fraction, by estimation, is 50 to 55%. The left ventricle has low normal function. Left ventricular endocardial border  not optimally defined to evaluate regional wall motion. Left ventricular diastolic parameters are indeterminate.  2. Right ventricular systolic function is severely reduced. The right ventricular size is severely enlarged. There is moderately elevated pulmonary artery systolic pressure.  3. Left atrial size was mildly dilated.  4. Right atrial size was severely dilated.  5. The mitral valve was not well visualized. Mild mitral valve regurgitation. No evidence of mitral stenosis.  6. Severe TR due to RV and anular dilatation, lack of leaflet coaptation. . The tricuspid valve is abnormal. Tricuspid valve regurgitation is severe.  7. Mild to moderate aortic stenosis. Lower than expected gradient due to SVI 26, consistent with paradoxical low flow low gradient mild to moderate aortic stenosis. . The aortic valve has an indeterminant number of cusps. There is moderate calcification  of the aortic valve. There is moderate thickening of the aortic valve. Aortic valve regurgitation is not visualized. Mild to moderate aortic valve  stenosis. Aortic valve mean gradient measures 10.0 mmHg. Aortic valve peak gradient measures 13.7 mmHg. Aortic valve area, by VTI measures 1.32 cm. DI 0.42  8. The inferior vena cava is dilated in size with <50% respiratory variability, suggesting right atrial pressure of 15 mmHg. FINDINGS  Left Ventricle: Left ventricular ejection fraction, by estimation, is 50 to 55%. The left ventricle has low normal function. Left ventricular endocardial border not optimally defined to evaluate regional wall motion. Definity contrast agent was given IV  to delineate the left ventricular endocardial borders. The left ventricular internal cavity size was normal in size. There is no left ventricular hypertrophy. Left ventricular diastolic parameters are indeterminate. Right Ventricle: Ventricular septum is flattened in systole and diastole consistent with RV pressure and volume overload. The right ventricular size is severely enlarged. Right vetricular wall thickness was not well visualized. Right ventricular systolic  function is severely reduced. There is moderately elevated pulmonary artery systolic pressure. The tricuspid regurgitant velocity is 2.81 m/s, and with an assumed right atrial pressure of 15 mmHg, the estimated right ventricular systolic pressure is 33.2 mmHg. Left Atrium: Left atrial size was mildly dilated. Right Atrium: Right atrial size was severely dilated. Pericardium: There is no evidence of pericardial effusion. Mitral Valve: The mitral valve was not well visualized. There is mild thickening of the mitral valve leaflet(s). There is mild calcification of the mitral valve leaflet(s). Mild mitral annular calcification. Mild mitral valve regurgitation. No evidence of mitral valve stenosis. Tricuspid Valve: Severe TR due to RV and anular dilatation, lack of leaflet coaptation. The tricuspid valve is abnormal. Tricuspid valve regurgitation is severe. No evidence of tricuspid stenosis. Aortic Valve: Mild to  moderate aortic stenosis. Lower than expected gradient due to SVI 26, consistent with paradoxical low flow low gradient mild to moderate aortic stenosis. The aortic valve has an indeterminant number of cusps. There is moderate calcification of the aortic valve. There is moderate thickening of the aortic valve. There is moderate aortic valve annular calcification. Aortic valve regurgitation is not visualized. Mild to moderate aortic stenosis is present. Aortic valve mean gradient measures 10.0 mmHg. Aortic valve peak gradient measures 13.7 mmHg. Aortic valve area, by VTI measures 1.32 cm. Pulmonic Valve: The pulmonic valve was not well visualized. Pulmonic valve regurgitation is not visualized. No evidence of pulmonic stenosis. Aorta: The aortic root and ascending aorta are structurally normal, with no evidence of dilitation. Venous: The inferior vena cava is dilated in size with less than 50% respiratory variability, suggesting right atrial pressure of 15 mmHg. IAS/Shunts: The  interatrial septum was not well visualized.  LEFT VENTRICLE PLAX 2D LVIDd:         5.40 cm   Diastology LVIDs:         3.70 cm   LV e' medial:    8.49 cm/s LV PW:         1.00 cm   LV E/e' medial:  12.5 LV IVS:        1.00 cm   LV e' lateral:   12.60 cm/s LVOT diam:     2.00 cm   LV E/e' lateral: 8.4 LV SV:         54 LV SV Index:   26 LVOT Area:     3.14 cm  RIGHT VENTRICLE RV S prime:     11.30 cm/s TAPSE (M-mode): 1.5 cm LEFT ATRIUM              Index        RIGHT ATRIUM           Index LA diam:        5.00 cm  2.39 cm/m   RA Area:     39.20 cm LA Vol (A2C):   105.0 ml 50.19 ml/m  RA Volume:   173.00 ml 82.69 ml/m LA Vol (A4C):   64.3 ml  30.73 ml/m LA Biplane Vol: 84.5 ml  40.39 ml/m  AORTIC VALVE                     PULMONIC VALVE AV Area (Vmax):    1.46 cm      RVOT Peak grad: 3 mmHg AV Area (Vmean):   1.35 cm AV Area (VTI):     1.32 cm AV Vmax:           185.00 cm/s AV Vmean:          136.000 cm/s AV VTI:            0.410 m  AV Peak Grad:      13.7 mmHg AV Mean Grad:      10.0 mmHg LVOT Vmax:         86.20 cm/s LVOT Vmean:        58.400 cm/s LVOT VTI:          0.172 m LVOT/AV VTI ratio: 0.42  AORTA Ao Root diam: 2.90 cm Ao Asc diam:  3.50 cm MITRAL VALVE                TRICUSPID VALVE MV Area (PHT): 4.01 cm     TR Peak grad:   31.6 mmHg MV Decel Time: 189 msec     TR Vmax:        281.00 cm/s MV E velocity: 106.00 cm/s MV A velocity: 34.70 cm/s   SHUNTS MV E/A ratio:  3.05         Systemic VTI:  0.17 m                             Systemic Diam: 2.00 cm                             Pulmonic VTI:  0.131 m Carlyle Dolly MD Electronically signed by Carlyle Dolly MD Signature Date/Time: 05/01/2022/12:55:09 PM    Final    US Venous Img Lower Unilateral Right (DVT)  Result Date: 05/01/2022 CLINICAL DATA:  Right lower extremity edema  and pain EXAM: LEFT LOWER EXTREMITY VENOUS DOPPLER ULTRASOUND TECHNIQUE: Gray-scale sonography with compression, as well as color and duplex ultrasound, were performed to evaluate the deep venous system(s) from the level of the common femoral vein through the popliteal and proximal calf veins. COMPARISON:  None available FINDINGS: VENOUS Normal compressibility of the common femoral, superficial femoral, and popliteal veins, as well as the visualized calf veins. Visualized portions of profunda femoral vein and great saphenous vein unremarkable. No filling defects to suggest DVT on grayscale or color Doppler imaging. Doppler waveforms show normal direction of venous flow, normal respiratory plasticity and response to augmentation. Limited views of the contralateral common femoral vein are unremarkable. OTHER None. Limitations: none IMPRESSION: No right lower extremity DVT. Electronically Signed   By: Miachel Roux M.D.   On: 05/01/2022 10:06   DG Chest 2 View  Result Date: 05/15/2022 CLINICAL DATA:  Shortness of breath EXAM: CHEST - 2 VIEW COMPARISON:  08/10/2021 FINDINGS: Cardiomegaly with small left-sided  pleural effusion. Vascular congestion. Aortic atherosclerosis. No pneumothorax IMPRESSION: Cardiomegaly with small left effusion and vascular calcification. Electronically Signed   By: Donavan Foil M.D.   On: 04/29/2022 18:30     Assessment and Plan:    1.HFpEF/Chronic RV failure/Severe TR  - admit 07/2021 40 lbs weight gain. Managed with lasix gtt with good response, basleine weight reportedly 200 lbs, during Jan 2023 appt he was 204 lbs. Admit wt 228 lbs  - 04/2022 Echo: LVE 50-55%, cannot eval WMAs, severe RV enlargement and dysfunction with D shaped septum, PASP 47, severe TR - Would consider start Bumex IV 2 mg BID from morning and changing to torsemide at home -Telemetry -K>4 and Mg >2 -Strict I/O   2. NSTEMI/CAD - NSTEMI in 2014 with DES to LAD - trop to 1141 without peak in setting of decompensated HF - EKG afib, RBBB, no acute ischemic changes - not a cath candidate due to advanced CKD GFR 26, advanced age and comorbidities - plan medical management. Plan 48 hrs heparin. COntinue atorva 40, lopressor 12.'5mg'$  bid for now though had some pauses based on prior notes. No ACE/ARB given renal dysfunction.    3. Permanent afib - eliquis was held on admit in case of invasive procedures, on hep gtt at this time   4. CKD IV -baseline Cr 2.3-2.7, currently 2.3 -Monitor Cr   5. Aortic stenosis Mild to moderate aortic stenosis. Lower than expected gradient due to  SVI 26, consistent with paradoxical low flow low gradient mild to moderate  aortic stenosis   6. Severe TR - function TR due to severely enlarged RV, anular dilatation with lack of coaptation - Would need aggressive diuresis.    For questions or updates, please contact [place] Please consult [email or web address] for contact info under    Signed, Jaci Lazier, MD  05/02/2022 2:24 AM

## 2022-05-02 NOTE — Assessment & Plan Note (Signed)
Calculated BMI is 37,9

## 2022-05-02 NOTE — Progress Notes (Signed)
Rounding Note    Patient Name: Lawrence Harper Date of Encounter: 05/02/2022  [facility] Cardiologist: Dr Phineas Inches  Subjective   Ongoing SOB  Inpatient Medications    Scheduled Meds:  aspirin EC  81 mg Oral Q breakfast   atorvastatin  40 mg Oral Daily   calcium-vitamin D  1 tablet Oral Q breakfast   feeding supplement  237 mL Oral BID BM   ferrous sulfate  325 mg Oral Q breakfast   folic acid  1 mg Oral Daily   furosemide  40 mg Intravenous Q12H   Gerhardt's butt cream   Topical QID   influenza vaccine adjuvanted  0.5 mL Intramuscular Tomorrow-1000   metoprolol tartrate  12.5 mg Oral BID   potassium chloride  10 mEq Oral QODAY   timolol  1 drop Both Eyes BID   Continuous Infusions:  heparin 1,400 Units/hr (05/02/22 0330)   [START ON 05/03/2022] vancomycin     PRN Meds:    Vital Signs    Vitals:   05/01/22 2047 05/01/22 2127 05/02/22 0118 05/02/22 0437  BP:  (!) 142/93 124/74   Pulse: 99 (!) 107 74   Resp: 20  (!) 21   Temp: 98.7 F (37.1 C)  99.3 F (37.4 C) 99.1 F (37.3 C)  TempSrc: Oral  Oral   SpO2:   94%   Weight: 104.3 kg   103.4 kg  Height:        Intake/Output Summary (Last 24 hours) at 05/02/2022 0752 Last data filed at 05/02/2022 0331 Gross per 24 hour  Intake 1392.05 ml  Output 1550 ml  Net -157.95 ml      05/02/2022    4:37 AM 05/01/2022    8:47 PM 05/01/2022    5:00 AM  Last 3 Weights  Weight (lbs) 227 lb 15.3 oz 229 lb 15 oz 228 lb 2.8 oz  Weight (kg) 103.4 kg 104.3 kg 103.5 kg      Telemetry    Rate controlled afib - Personally Reviewed  ECG    N/a - Personally Reviewed  Physical Exam   GEN: No acute distress.   Neck: +JVD Cardiac: irreg, 2/6 systolic murmur LLSB Respiratory: crackles bilaterally GI: Soft, nontender, non-distended  MS: No edema; No deformity. Neuro:  Nonfocal  Psych: Normal affect   Labs    High Sensitivity Troponin:   Recent Labs  Lab 05/14/2022 2352 05/01/22 0136 05/01/22 0525  05/01/22 0723 05/01/22 1652  TROPONINIHS 31* 486* 920* 1,141* 1,314*     Chemistry Recent Labs  Lab 05/12/2022 1824 05/18/2022 1825 05/21/2022 2352  NA 137  --   --   K 4.9  --   --   CL 103  --   --   CO2 25  --   --   GLUCOSE 96  --   --   BUN 56*  --   --   CREATININE 2.34*  --   --   CALCIUM 9.2  --   --   MG  --   --  2.5*  PROT  --  8.5*  --   ALBUMIN  --  3.2*  --   AST  --  22  --   ALT  --  13  --   ALKPHOS  --  135*  --   BILITOT  --  1.5*  --   GFRNONAA 26*  --   --   ANIONGAP 9  --   --  Lipids No results for input(s): "CHOL", "TRIG", "HDL", "LABVLDL", "LDLCALC", "CHOLHDL" in the last 168 hours.  Hematology Recent Labs  Lab 05/12/2022 1824 05/01/22 0525  WBC 12.3* 14.7*  RBC 3.78* 3.52*  HGB 11.4* 10.8*  HCT 37.1* 34.7*  MCV 98.1 98.6  MCH 30.2 30.7  MCHC 30.7 31.1  RDW 19.2* 19.6*  PLT 227 209   Thyroid No results for input(s): "TSH", "FREET4" in the last 168 hours.  BNP Recent Labs  Lab 04/26/2022 2352  BNP 2,619.0*    DDimer No results for input(s): "DDIMER" in the last 168 hours.   Radiology    ECHOCARDIOGRAM COMPLETE  Result Date: 05/01/2022    ECHOCARDIOGRAM REPORT   Patient Name:   Lawrence Harper Date of Exam: 05/01/2022 Medical Rec #:  [id number]      Height:       65.0 in Accession #:    [id number]     Weight:       228.2 lb Date of Birth:  06-09-1932     BSA:          2.092 m Patient Age:    57 years       BP:           115/79 mmHg Patient Gender: M              HR:           73 bpm. Exam Location:  [facility] Procedure: 2D Echo, Color Doppler, Cardiac Doppler and Intracardiac            Opacification Agent Indications:    F09.32 Acute diastolic (congestive) heart failure  History:        Patient has prior history of Echocardiogram examinations, most                 recent 08/11/2021. CHF, Arrythmias:Atrial Fibrillation; Risk                 Factors:Hypertension, Diabetes and Dyslipidemia.  Sonographer:    Raquel Sarna Senior RDCS Referring Phys: [id number]  OLADAPO ADEFESO  Sonographer Comments: Poor apical windows due to lung interference. IMPRESSIONS  1. Left ventricular ejection fraction, by estimation, is 50 to 55%. The left ventricle has low normal function. Left ventricular endocardial border not optimally defined to evaluate regional wall motion. Left ventricular diastolic parameters are indeterminate.  2. Right ventricular systolic function is severely reduced. The right ventricular size is severely enlarged. There is moderately elevated pulmonary artery systolic pressure.  3. Left atrial size was mildly dilated.  4. Right atrial size was severely dilated.  5. The mitral valve was not well visualized. Mild mitral valve regurgitation. No evidence of mitral stenosis.  6. Severe TR due to RV and anular dilatation, lack of leaflet coaptation. . The tricuspid valve is abnormal. Tricuspid valve regurgitation is severe.  7. Mild to moderate aortic stenosis. Lower than expected gradient due to SVI 26, consistent with paradoxical low flow low gradient mild to moderate aortic stenosis. . The aortic valve has an indeterminant number of cusps. There is moderate calcification  of the aortic valve. There is moderate thickening of the aortic valve. Aortic valve regurgitation is not visualized. Mild to moderate aortic valve stenosis. Aortic valve mean gradient measures 10.0 mmHg. Aortic valve peak gradient measures 13.7 mmHg. Aortic valve area, by VTI measures 1.32 cm. DI 0.42  8. The inferior vena cava is dilated in size with <50% respiratory variability, suggesting right atrial pressure of 15 mmHg. FINDINGS  Left  Ventricle: Left ventricular ejection fraction, by estimation, is 50 to 55%. The left ventricle has low normal function. Left ventricular endocardial border not optimally defined to evaluate regional wall motion. Definity contrast agent was given IV  to delineate the left ventricular endocardial borders. The left ventricular internal cavity size was normal in size.  There is no left ventricular hypertrophy. Left ventricular diastolic parameters are indeterminate. Right Ventricle: Ventricular septum is flattened in systole and diastole consistent with RV pressure and volume overload. The right ventricular size is severely enlarged. Right vetricular wall thickness was not well visualized. Right ventricular systolic  function is severely reduced. There is moderately elevated pulmonary artery systolic pressure. The tricuspid regurgitant velocity is 2.81 m/s, and with an assumed right atrial pressure of 15 mmHg, the estimated right ventricular systolic pressure is 29.9 mmHg. Left Atrium: Left atrial size was mildly dilated. Right Atrium: Right atrial size was severely dilated. Pericardium: There is no evidence of pericardial effusion. Mitral Valve: The mitral valve was not well visualized. There is mild thickening of the mitral valve leaflet(s). There is mild calcification of the mitral valve leaflet(s). Mild mitral annular calcification. Mild mitral valve regurgitation. No evidence of mitral valve stenosis. Tricuspid Valve: Severe TR due to RV and anular dilatation, lack of leaflet coaptation. The tricuspid valve is abnormal. Tricuspid valve regurgitation is severe. No evidence of tricuspid stenosis. Aortic Valve: Mild to moderate aortic stenosis. Lower than expected gradient due to SVI 26, consistent with paradoxical low flow low gradient mild to moderate aortic stenosis. The aortic valve has an indeterminant number of cusps. There is moderate calcification of the aortic valve. There is moderate thickening of the aortic valve. There is moderate aortic valve annular calcification. Aortic valve regurgitation is not visualized. Mild to moderate aortic stenosis is present. Aortic valve mean gradient measures 10.0 mmHg. Aortic valve peak gradient measures 13.7 mmHg. Aortic valve area, by VTI measures 1.32 cm. Pulmonic Valve: The pulmonic valve was not well visualized. Pulmonic valve  regurgitation is not visualized. No evidence of pulmonic stenosis. Aorta: The aortic root and ascending aorta are structurally normal, with no evidence of dilitation. Venous: The inferior vena cava is dilated in size with less than 50% respiratory variability, suggesting right atrial pressure of 15 mmHg. IAS/Shunts: The interatrial septum was not well visualized.  LEFT VENTRICLE PLAX 2D LVIDd:         5.40 cm   Diastology LVIDs:         3.70 cm   LV e' medial:    8.49 cm/s LV PW:         1.00 cm   LV E/e' medial:  12.5 LV IVS:        1.00 cm   LV e' lateral:   12.60 cm/s LVOT diam:     2.00 cm   LV E/e' lateral: 8.4 LV SV:         54 LV SV Index:   26 LVOT Area:     3.14 cm  RIGHT VENTRICLE RV S prime:     11.30 cm/s TAPSE (M-mode): 1.5 cm LEFT ATRIUM              Index        RIGHT ATRIUM           Index LA diam:        5.00 cm  2.39 cm/m   RA Area:     39.20 cm LA Vol (A2C):   105.0 ml 50.19 ml/m  RA  Volume:   173.00 ml 82.69 ml/m LA Vol (A4C):   64.3 ml  30.73 ml/m LA Biplane Vol: 84.5 ml  40.39 ml/m  AORTIC VALVE                     PULMONIC VALVE AV Area (Vmax):    1.46 cm      RVOT Peak grad: 3 mmHg AV Area (Vmean):   1.35 cm AV Area (VTI):     1.32 cm AV Vmax:           185.00 cm/s AV Vmean:          136.000 cm/s AV VTI:            0.410 m AV Peak Grad:      13.7 mmHg AV Mean Grad:      10.0 mmHg LVOT Vmax:         86.20 cm/s LVOT Vmean:        58.400 cm/s LVOT VTI:          0.172 m LVOT/AV VTI ratio: 0.42  AORTA Ao Root diam: 2.90 cm Ao Asc diam:  3.50 cm MITRAL VALVE                TRICUSPID VALVE MV Area (PHT): 4.01 cm     TR Peak grad:   31.6 mmHg MV Decel Time: 189 msec     TR Vmax:        281.00 cm/s MV E velocity: 106.00 cm/s MV A velocity: 34.70 cm/s   SHUNTS MV E/A ratio:  3.05         Systemic VTI:  0.17 m                             Systemic Diam: 2.00 cm                             Pulmonic VTI:  0.131 m Carlyle Dolly MD Electronically signed by Carlyle Dolly MD Signature Date/Time:  05/01/2022/12:55:09 PM    Final    US Venous Img Lower Unilateral Right (DVT)  Result Date: 05/01/2022 CLINICAL DATA:  Right lower extremity edema and pain EXAM: LEFT LOWER EXTREMITY VENOUS DOPPLER ULTRASOUND TECHNIQUE: Gray-scale sonography with compression, as well as color and duplex ultrasound, were performed to evaluate the deep venous system(s) from the level of the common femoral vein through the popliteal and proximal calf veins. COMPARISON:  None available FINDINGS: VENOUS Normal compressibility of the common femoral, superficial femoral, and popliteal veins, as well as the visualized calf veins. Visualized portions of profunda femoral vein and great saphenous vein unremarkable. No filling defects to suggest DVT on grayscale or color Doppler imaging. Doppler waveforms show normal direction of venous flow, normal respiratory plasticity and response to augmentation. Limited views of the contralateral common femoral vein are unremarkable. OTHER None. Limitations: none IMPRESSION: No right lower extremity DVT. Electronically Signed   By: Miachel Roux M.D.   On: 05/01/2022 10:06   DG Chest 2 View  Result Date: 04/29/2022 CLINICAL DATA:  Shortness of breath EXAM: CHEST - 2 VIEW COMPARISON:  08/10/2021 FINDINGS: Cardiomegaly with small left-sided pleural effusion. Vascular congestion. Aortic atherosclerosis. No pneumothorax IMPRESSION: Cardiomegaly with small left effusion and vascular calcification. Electronically Signed   By: Donavan Foil M.D.   On: 04/29/2022 18:30    Cardiac Studies   Patient Profile  PETER KEYWORTH is a 88 y.o. male with a hx of essential hypertension, CAD s/p stent placement, chronic atrial fibrillation, CKD stage IV, chronic diastolic CHF  who is being seen 05/02/2022 for the evaluation of NSTEMI at the request of Dr. Cathlean Sauer  Assessment & Plan    1.HFpEF/Chronic RV failure/Severe TR  - admit 07/2021 40 lbs weight gain. Managed with lasix gtt with good response, basleine  weight reportedly 200 lbs, during Jan 2023 appt he was 204 lbs. Admit wt 228 lbs -BNP 2619   04/2022 Echo: LVE 50-55%, cannot eval WMAs, severe RV enlargement and dysfunction with D shaped septum, PASP 47, severe TR   - negative 12m yesterday, he is on IV lasix '40mg'$  bid. Labs pending.Pending renal function would titrate lasix, during prior admit required lasix drip which would also be a consideration.      2. NSTEMI/CAD - NSTEMI in 2014 with DES to LAD - trop to 1314 without peak in setting of decompensated HF - EKG afib, RBBB, no acute ischemic changes   - not a cath candidate due to advanced CKD GFR 26, advanced age and comorbidities - plan medical management. Plan 48 hrs heparin which would conclude 05/03/22 in the AM. COntinue ASA 81, atorva 40, lopressor 12.'5mg'$  bid for now though had some pauses based on prior notes. No ACE/ARB given renal dysfunction.        3.Permanent afib - off beta blocker due to prior pauses - eliquis was held on admit in case of invasive procedures, on hep gtt at this time       4. CKD IV -baseline Cr 2.3-2.7   5. Aortic stenosis Mild to moderate aortic stenosis. Lower than expected gradient due to  SVI 26, consistent with paradoxical low flow low gradient mild to moderate  aortic stenosis   6. Severe TR - function TR due to severely enlarged RV, anular dilatation with lack of coaptation      For questions or updates, please contact CDaphnePlease consult [email or web address] for contact info under        Signed, BCarlyle Dolly MD  05/02/2022, 7:52 AM

## 2022-05-02 NOTE — Assessment & Plan Note (Addendum)
CKD stage IV. Hyponatremia.  Volume is improving but not yet back to baseline.  Renal function with serum cr at 2,63, K is 4,4 and serum bicarbonate at 21. Na 132.  Continue diuresis with furosemide, avoid hypotension and nephrotoxic medications.   Chronic anemia, due to chronic renal disease, Iron deficiency hgb has been stable, continue with oral iron supplementation.

## 2022-05-02 NOTE — Assessment & Plan Note (Addendum)
Echocardiogram with preserved LV systolic function 50 to 30%T RV systolic function with severe reduction, RV severe cavity enlargement, RVSP 46,6 mmHg, RA with severe dilatation, severe TR, mild to moderate aortic stenosis,   Acute on chronic core pulmonale Pulmonary hypertension,   Urine output is 484 ml  Systolic blood pressure 0397 to 114 mmHg.  Plan to continue diuresis with furosemide 80 mg IV q12  Tolerating well metoprolol.   Acute hypoxemic respiratory failure.ruled out.

## 2022-05-02 NOTE — Assessment & Plan Note (Addendum)
Coronary artery disease. Patient with no chest pain.  Continue metoprolol, aspirin and atorvastatin.  Resume apixaban and discontinue heparin.

## 2022-05-02 NOTE — Hospital Course (Addendum)
Lawrence Harper was admitted to the hospital with the working diagnosis of decompensated heart failure.   88 yo male with the past medical history of hypertension, coronary artery disease, atrial fibrillation, CKD stage IV and chronic diastolic heart failure who presented with fatigue. Reported 2 weeks of dyspnea, lower extremity edema, increased abdominal girth and orthopnea. On his initial physical examination his blood pressure was 136/73, HR 84, RR 18 and 02 saturation 100%, lungs with rales bilaterally, with no wheezing, heart with S1 and S2 present irregularly irregular, abdomen not distended, positive lower extremity edema.   Na 137, K 4,9, Cl 103, bicarbonate 25, glucose 96 bun 56 cr 2,34  ALK P 135, T bil 1,5  BNP 2,619  High sensitive troponin 31 and 486  Wbc 12.3 hgb 11.4 plt 227  Sars covid 19 negative   Urine analysis with SG 1,013, 30 protein, 0-5 wbc   Chest radiograph with cardiomegaly and mild bilateral hilar vascular congestion, no infiltrates.   EKG 90 bpm, normal axis, right bundle branch block, atrial fibrillation rhythm with small q V1 to V3, with no significant ST segment or T wave changes (old changes).   Patient was placed on furosemide for diuresis.  He was placed on heparin for NSTEMI.   09/11 his volume status has been improving.  Oral antibiotic therapy for right leg cellulitis.  09/12 abdominal with mild to moderate ascites.

## 2022-05-02 NOTE — Assessment & Plan Note (Signed)
Sepsis ruled out.   Possible component of vascular insufficiency combined with local skin infection Will transition to oral cephalexin and continue close monitoring.

## 2022-05-02 NOTE — Assessment & Plan Note (Signed)
Continue with metoprolol, and diuresis with furosemide.

## 2022-05-02 NOTE — Progress Notes (Signed)
Pt had c/o of SOB, O2 saturation at 95 % on 2 L . Lung sounds. Wheezing on L lung, diminished . R lung clear/diminished. Pt was very alert this AM but is now very sleepy. CBG checked at 118. VS at 104/72, HR = 67; No urine output noted post lasix this AM . Dr. Harl Bowie and Dr. Cathlean Sauer updated.

## 2022-05-02 NOTE — Assessment & Plan Note (Signed)
Continue with nutritional supplements.  Low albuminemia.

## 2022-05-03 DIAGNOSIS — I214 Non-ST elevation (NSTEMI) myocardial infarction: Secondary | ICD-10-CM | POA: Diagnosis not present

## 2022-05-03 DIAGNOSIS — I5033 Acute on chronic diastolic (congestive) heart failure: Secondary | ICD-10-CM | POA: Diagnosis not present

## 2022-05-03 DIAGNOSIS — N179 Acute kidney failure, unspecified: Secondary | ICD-10-CM | POA: Diagnosis not present

## 2022-05-03 DIAGNOSIS — I10 Essential (primary) hypertension: Secondary | ICD-10-CM | POA: Diagnosis not present

## 2022-05-03 LAB — BASIC METABOLIC PANEL
Anion gap: 10 (ref 5–15)
BUN: 75 mg/dL — ABNORMAL HIGH (ref 8–23)
CO2: 21 mmol/L — ABNORMAL LOW (ref 22–32)
Calcium: 8.6 mg/dL — ABNORMAL LOW (ref 8.9–10.3)
Chloride: 101 mmol/L (ref 98–111)
Creatinine, Ser: 2.63 mg/dL — ABNORMAL HIGH (ref 0.61–1.24)
GFR, Estimated: 23 mL/min — ABNORMAL LOW (ref >60)
Glucose, Bld: 103 mg/dL — ABNORMAL HIGH (ref 70–99)
Potassium: 4.4 mmol/L (ref 3.5–5.1)
Sodium: 132 mmol/L — ABNORMAL LOW (ref 135–145)

## 2022-05-03 LAB — GLUCOSE, CAPILLARY
Glucose-Capillary: 92 mg/dL (ref 70–99)
Glucose-Capillary: 128 mg/dL — ABNORMAL HIGH (ref 70–99)

## 2022-05-03 LAB — CBC
HCT: 32.1 % — ABNORMAL LOW (ref 39.0–52.0)
Hemoglobin: 9.9 g/dL — ABNORMAL LOW (ref 13.0–17.0)
MCH: 29.9 pg (ref 26.0–34.0)
MCHC: 30.8 g/dL (ref 30.0–36.0)
MCV: 97 fL (ref 80.0–100.0)
Platelets: 198 10*3/uL (ref 150–400)
RBC: 3.31 MIL/uL — ABNORMAL LOW (ref 4.22–5.81)
RDW: 18.9 % — ABNORMAL HIGH (ref 11.5–15.5)
WBC: 9 10*3/uL (ref 4.0–10.5)
nRBC: 0.2 % (ref 0.0–0.2)

## 2022-05-03 LAB — MRSA NEXT GEN BY PCR, NASAL: MRSA by PCR Next Gen: NOT DETECTED

## 2022-05-03 LAB — MAGNESIUM: Magnesium: 2.3 mg/dL (ref 1.7–2.4)

## 2022-05-03 LAB — APTT: aPTT: 60 seconds — ABNORMAL HIGH (ref 24–36)

## 2022-05-03 LAB — HEPARIN LEVEL (UNFRACTIONATED): Heparin Unfractionated: 0.76 IU/mL — ABNORMAL HIGH (ref 0.30–0.70)

## 2022-05-03 MED ORDER — IPRATROPIUM-ALBUTEROL 0.5-2.5 (3) MG/3ML IN SOLN
3.0000 mL | Freq: Four times a day (QID) | RESPIRATORY_TRACT | Status: DC
  Administered 2022-05-03 – 2022-05-05 (×8): 3 mL via RESPIRATORY_TRACT
  Filled 2022-05-03 (×10): qty 3

## 2022-05-03 MED ORDER — CEPHALEXIN 250 MG PO CAPS
250.0000 mg | ORAL_CAPSULE | Freq: Two times a day (BID) | ORAL | Status: DC
  Administered 2022-05-03 – 2022-05-04 (×3): 250 mg via ORAL
  Filled 2022-05-03 (×4): qty 1

## 2022-05-03 MED ORDER — OXYCODONE HCL 5 MG PO TABS
5.0000 mg | ORAL_TABLET | Freq: Four times a day (QID) | ORAL | Status: DC | PRN
  Administered 2022-05-03 (×2): 5 mg via ORAL
  Filled 2022-05-03 (×2): qty 1

## 2022-05-03 MED ORDER — HYDROMORPHONE HCL 1 MG/ML IJ SOLN
0.5000 mg | INTRAMUSCULAR | Status: AC | PRN
  Administered 2022-05-03 – 2022-05-04 (×3): 0.5 mg via INTRAVENOUS
  Filled 2022-05-03 (×3): qty 1

## 2022-05-03 MED ORDER — SODIUM CHLORIDE 0.9 % IV SOLN: INTRAVENOUS | Status: DC | PRN

## 2022-05-03 MED ORDER — CEFAZOLIN SODIUM-DEXTROSE 2-4 GM/100ML-% IV SOLN
2.0000 g | Freq: Three times a day (TID) | INTRAVENOUS | Status: DC
  Filled 2022-05-03 (×2): qty 100

## 2022-05-03 MED ORDER — CEFAZOLIN SODIUM-DEXTROSE 1-4 GM/50ML-% IV SOLN
1.0000 g | Freq: Two times a day (BID) | INTRAVENOUS | Status: DC
  Administered 2022-05-03 (×2): 1 g via INTRAVENOUS
  Filled 2022-05-03 (×3): qty 50

## 2022-05-03 MED ORDER — APIXABAN 2.5 MG PO TABS
2.5000 mg | ORAL_TABLET | Freq: Two times a day (BID) | ORAL | Status: DC
  Administered 2022-05-03 – 2022-05-05 (×4): 2.5 mg via ORAL
  Filled 2022-05-03 (×4): qty 1

## 2022-05-03 MED ORDER — MELATONIN 5 MG PO TABS: 5.0000 mg | ORAL_TABLET | Freq: Every evening | ORAL | Status: DC | PRN

## 2022-05-03 MED ORDER — ACETAMINOPHEN 325 MG PO TABS: 650.0000 mg | ORAL_TABLET | Freq: Four times a day (QID) | ORAL | Status: DC | PRN

## 2022-05-03 MED ORDER — SENNOSIDES-DOCUSATE SODIUM 8.6-50 MG PO TABS
2.0000 | ORAL_TABLET | Freq: Every day | ORAL | Status: DC
  Administered 2022-05-03 (×2): 2 via ORAL
  Filled 2022-05-03 (×2): qty 2

## 2022-05-03 MED ORDER — POLYETHYLENE GLYCOL 3350 17 G PO PACK: 17.0000 g | PACK | Freq: Every day | ORAL | Status: DC | PRN

## 2022-05-03 MED ORDER — MEDIHONEY WOUND/BURN DRESSING EX PSTE
1.0000 | PASTE | Freq: Every day | CUTANEOUS | Status: DC
Start: 2022-05-03 — End: 2022-05-07
  Administered 2022-05-03 – 2022-05-05 (×3): 1 via TOPICAL
  Filled 2022-05-03: qty 44

## 2022-05-03 NOTE — Progress Notes (Addendum)
Progress Note   Patient: Lawrence Harper JIR:[id number] DOB: [birth year] DOA: 05/20/2022     2 DOS: the patient was seen and examined on 05/03/2022   Brief hospital course: Lawrence Harper was admitted to the hospital with the working diagnosis of decompensated heart failure.   88 yo male with the past medical history of hypertension, coronary artery disease, atrial fibrillation, CKD stage IV and chronic diastolic heart failure who presented with fatigue. Reported 2 weeks of dyspnea, lower extremity edema, increased abdominal girth and orthopnea. On his initial physical examination his blood pressure was 136/73, HR 84, RR 18 and 02 saturation 100%, lungs with rales bilaterally, with no wheezing, heart with S1 and S2 present irregularly irregular, abdomen not distended, positive lower extremity edema.   Na 137, K 4,9, Cl 103, bicarbonate 25, glucose 96 bun 56 cr 2,34  ALK P 135, T bil 1,5  BNP 2,619  High sensitive troponin 31 and 486  Wbc 12.3 hgb 11.4 plt 227  Sars covid 19 negative   Urine analysis with SG 1,013, 30 protein, 0-5 wbc   Chest radiograph with cardiomegaly and mild bilateral hilar vascular congestion, no infiltrates.   EKG 90 bpm, normal axis, right bundle branch block, atrial fibrillation rhythm with small q V1 to V3, with no significant ST segment or T wave changes (old changes).   Patient was placed on furosemide for diuresis.  He was placed on heparin for NSTEMI.   09/11 his volume status has been improving.  Oral antibiotic therapy for right leg cellulitis.       Assessment and Plan: * Acute on chronic diastolic CHF/HFpEF Echocardiogram with preserved LV systolic function 50 to 75%Z RV systolic function with severe reduction, RV severe cavity enlargement, RVSP 46,6 mmHg, RA with severe dilatation, severe TR, mild to moderate aortic stenosis,   Acute on chronic core pulmonale Pulmonary hypertension,   Urine output is 025 ml  Systolic blood pressure 8527 to 114  mmHg.  Plan to continue diuresis with furosemide 80 mg IV q12  Tolerating well metoprolol.   Acute hypoxemic respiratory failure.ruled out.   NSTEMI (non-ST elevated myocardial infarction)  Coronary artery disease. Patient with no chest pain.  Continue metoprolol, aspirin and atorvastatin.  Resume apixaban and discontinue heparin.   Acute kidney injury superimposed on chronic kidney disease ([place]) CKD stage IV. Hyponatremia.  Volume is improving but not yet back to baseline.  Renal function with serum cr at 2,63, K is 4,4 and serum bicarbonate at 21. Na 132.  Continue diuresis with furosemide, avoid hypotension and nephrotoxic medications.   Chronic anemia, due to chronic renal disease, Iron deficiency hgb has been stable, continue with oral iron supplementation.    Essential hypertension Continue with metoprolol, and diuresis with furosemide.   Atrial fibrillation (HCC) Continue rate control with metoprolol and anticoagulation with apixaban.   Sepsis due to Rt Leg Cellulitis  Sepsis ruled out.   Possible component of vascular insufficiency combined with local skin infection Will transition to oral cephalexin and continue close monitoring.    Hypoalbuminemia due to protein-calorie malnutrition ([place]) Continue with nutritional supplements.  Low albuminemia.   Obesity (BMI 30-39.9) Calculated BMI is 37,9         Subjective: Patient is feeling better, his dyspnea and edema are improving but not back to baseline.   Physical Exam: Vitals:   05/02/22 2042 05/03/22 0657 05/03/22 0742 05/03/22 1236  BP: 127/75 114/78    Pulse: 85 91  (!) (P) 48  Resp:  20  (P) 17  Temp:  98.7 F (37.1 C)  (P) 97.6 F (36.4 C)  TempSrc:  Oral  (P) Oral  SpO2:  97% 96% (P) 94%  Weight:  104.3 kg    Height:       Neurology awake and alert ENT with mild pallor Cardiovascular S1 and S2 present, irregularly irregular with no gallops, or rubs, positive murmur at the left lower  sternal border.  Mild JVD Positive lower extremity edema + to ++ pitting  Respiratory with mild rales and expiratory wheezing Abdomen not distended Right leg anterior aspect discoloration non tender to palpation, it is below the demarcated skin region.   Data Reviewed:    Family Communication: I spoke with patient's daughter at the bedside, we talked in detail about patient's condition, plan of care and prognosis and all questions were addressed.    Disposition: Status is: Inpatient Remains inpatient appropriate because: IV diuresis for heart failure   Planned Discharge Destination: Skilled nursing facility    Author: Tawni Millers, MD 05/03/2022 1:56 PM  For on call review [email or web address].

## 2022-05-03 NOTE — Evaluation (Signed)
Physical Therapy Evaluation Patient Details Name: Lawrence Harper MRN: [id number] DOB: November 29, 1931 Today's Date: 05/03/2022  History of Present Illness  Patient is a 88 y/o male who presents on 04/24/2022 with fatigue, weakness and SOB. Admitted with acute on chronic diastolic CHF, NSTEMI and sepsis secondary to LE cellulitis. PMH includes paroxysmal A-fib, CAD, HTN, obesity, CKD, HF, DM, PVD.  Clinical Impression  Patient presents with generalized weakness, decreased activity tolerance, pain and impaired mobility s/p above. Pt lives at home with his 56 y/o wife and uses [facility] for ambulation PTA. 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 Today, pt requires Min A for transfers and gait training with use of RW for support. Fatigues quickly. VSS on RA with activity. Would benefit from SNF to maximize independence and mobility prior to return home. Will follow acutely.       Recommendations for follow up therapy are one component of a multi-disciplinary discharge planning process, led by the attending physician.  Recommendations may be updated based on patient status, additional functional criteria and insurance authorization.  Follow Up Recommendations Skilled nursing-short term rehab (<3 hours/day) Can patient physically be transported by private vehicle: Yes    Assistance Recommended at Discharge Frequent or constant Supervision/Assistance  Patient can return home with the following  A lot of help with walking and/or transfers;A little help with bathing/dressing/bathroom;Help with stairs or ramp for entrance;Assist for transportation;Assistance with cooking/housework    Equipment Recommendations None recommended by PT  Recommendations for Other Services       Functional Status Assessment Patient has had a recent decline in their functional status and demonstrates the ability to make significant improvements in function in a reasonable and predictable amount of time.      Precautions / Restrictions Precautions Precautions: Fall Restrictions Weight Bearing Restrictions: No      Mobility  Bed Mobility               General bed mobility comments: Sitting EOB upon PT arrival.    Transfers Overall transfer level: Needs assistance Equipment used: Rolling walker (2 wheels) Transfers: Sit to/from Stand Sit to Stand: Min assist, From elevated surface           General transfer comment: Min A to power to standing with cues for hand placement/technique. Has electric bed at home.    Ambulation/Gait Ambulation/Gait assistance: Min guard, Min assist Gait Distance (Feet): 26 Feet Assistive device: Rolling walker (2 wheels) Gait Pattern/deviations: Step-through pattern, Decreased stride length, Trunk flexed Gait velocity: decreased     General Gait Details: Slow, mildly unsteady gait with use of RW, effortful towards end with RLE pain. Sp02 remained in 90s on RA.  Stairs            Wheelchair Mobility    Modified Rankin (Stroke Patients Only)       Balance Overall balance assessment: Needs assistance Sitting-balance support: Feet supported, No upper extremity supported Sitting balance-Leahy Scale: Fair     Standing balance support: During functional activity, Reliant on assistive device for balance Standing balance-Leahy Scale: Poor                               Pertinent Vitals/Pain Pain Assessment Pain Assessment: Faces Faces Pain Scale: Hurts little more Pain Location: Rt leg Pain Descriptors / Indicators: Guarding, Grimacing Pain Intervention(s): Monitored during session, Repositioned    Home Living Family/patient expects to be discharged to::  Private residence Living Arrangements: Spouse/significant other Available Help at Discharge: Family;Available 24 hours/day;Available PRN/intermittently Type of Home: House Home Access: Stairs to enter Entrance Stairs-Rails: Right Entrance Stairs-Number of  Steps: 2 Alternate Level Stairs-Number of Steps: elevator Home Layout: Two level;Able to live on main level with bedroom/bathroom Home Equipment: Rolling Walker (2 wheels);Wheelchair - Regulatory affairs officer - single point;Grab bars - toilet;Grab bars - tub/shower      Prior Function Prior Level of Function : Independent/Modified Independent;Driving             Mobility Comments: walking with cane, uses RW for community distance as needed, 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 ADLs Comments: independent     Hand Dominance   Dominant Hand: Right    Extremity/Trunk Assessment   Upper Extremity Assessment Upper Extremity Assessment: Defer to OT evaluation    Lower Extremity Assessment Lower Extremity Assessment: Generalized weakness    Cervical / Trunk Assessment Cervical / Trunk Assessment: Kyphotic  Communication   Communication: No difficulties  Cognition Arousal/Alertness: Awake/alert Behavior During Therapy: WFL for tasks assessed/performed Overall Cognitive Status: Within Functional Limits for tasks assessed                                          General Comments General comments (skin integrity, edema, etc.): VSS on RA, Some mild redness and swelling RLE.    Exercises     Assessment/Plan    PT Assessment Patient needs continued PT services  PT Problem List Decreased strength;Decreased mobility;Pain;Decreased balance;Decreased activity tolerance;Cardiopulmonary status limiting activity;Decreased skin integrity       PT Treatment Interventions Therapeutic activities;Gait training;Therapeutic exercise;Patient/family education;Balance training;Functional mobility training;Stair training    PT Goals (Current goals can be found in the Care Plan section)  Acute Rehab PT Goals Patient Stated Goal: to go to rehab and get stronger PT Goal Formulation: With patient Time For Goal Achievement: 05/17/22 Potential to Achieve Goals: Good    Frequency  Min 3X/week     Co-evaluation               AM-PAC PT "6 Clicks" Mobility  Outcome Measure Help needed turning from your back to your side while in a flat bed without using bedrails?: A Little Help needed moving from lying on your back to sitting on the side of a flat bed without using bedrails?: A Little Help needed moving to and from a bed to a chair (including a wheelchair)?: A Little Help needed standing up from a chair using your arms (e.g., wheelchair or bedside chair)?: A Little Help needed to walk in hospital room?: Total Help needed climbing 3-5 steps with a railing? : Total 6 Click Score: 14    End of Session Equipment Utilized During Treatment: Gait belt Activity Tolerance: Patient limited by fatigue Patient left: in bed;with call bell/phone within reach;with bed alarm set (sitting EOB) Nurse Communication: Mobility status PT Visit Diagnosis: Pain;Muscle weakness (generalized) (M62.81);Difficulty in walking, not elsewhere classified (R26.2) Pain - Right/Left: Right Pain - part of body: Leg    Time: 1002-1018 PT Time Calculation (min) (ACUTE ONLY): 16 min   Charges:   PT Evaluation $PT Eval Moderate Complexity: 1 Mod          Marisa Severin, PT, DPT Acute Rehabilitation Services Secure chat preferred Office [phone]     Lawrence Harper 05/03/2022, 10:54 AM

## 2022-05-03 NOTE — TOC Initial Note (Signed)
Transition of Care [facility]) - Initial/Assessment Note    Patient Details  Name: Lawrence Harper MRN: [id number] Date of Birth: December 06, 1931  [facility] CM/SW Contact:    Milas Gain, [place] Phone Number: 05/03/2022, 12:54 PM  Clinical Narrative:                  CSW received consult for possible SNF placement at time of discharge. CSW spoke with patient at bedside regarding PT recommendation of SNF placement at time of discharge. Patient reports he comes from home with spouse. Patient expressed understanding of PT recommendation and is agreeable to SNF placement at time of discharge. Patient reports preference for [facility] . Patient gave CSW permission to fax out initial referral near the [place] area.CSW discussed insurance authorization process and will provide Medicare SNF ratings list with accepted SNF bed offers when available. Patient reports he is Covid Vaccinated.Patient expressed being hopeful for rehab and to feel better soon. No further questions reported at this time. CSW to continue to follow and assist with discharge planning needs.   Expected Discharge Plan: Skilled Nursing Facility Barriers to Discharge: Continued Medical Work up   Patient Goals and CMS Choice Patient states their goals for this hospitalization and ongoing recovery are:: SNF CMS Medicare.gov Compare Post Acute Care list provided to:: Patient Choice offered to / list presented to : Patient  Expected Discharge Plan and Services Expected Discharge Plan: [place] In-house Referral: Clinical Social Work     Living arrangements for the past 2 months: [place]                                      Prior Living Arrangements/Services Living arrangements for the past 2 months: Single Family Home Lives with:: Spouse Patient language and need for interpreter reviewed:: Yes Do you feel safe going back to the place where you live?: No   SNF  Need for Family  Participation in Patient Care: Yes (Comment) Care giver support system in place?: Yes (comment)   Criminal Activity/Legal Involvement Pertinent to Current Situation/Hospitalization: No - Comment as needed  Activities of Daily Living Home Assistive Devices/Equipment: Shower chair without back, Cane (specify quad or straight), Walker (specify type) ADL Screening (condition at time of admission) Patient's cognitive ability adequate to safely complete daily activities?: Yes Is the patient deaf or have difficulty hearing?: No Does the patient have difficulty seeing, even when wearing glasses/contacts?: No Does the patient have difficulty concentrating, remembering, or making decisions?: No Patient able to express need for assistance with ADLs?: Yes Does the patient have difficulty dressing or bathing?: No Independently performs ADLs?: Yes (appropriate for developmental age) Does the patient have difficulty walking or climbing stairs?: Yes Weakness of Legs: Both Weakness of Arms/Hands: None  Permission Sought/Granted Permission sought to share information with : Case Manager, Family Supports, Chartered certified accountant granted to share information with : Yes, Verbal Permission Granted  Share Information with NAME: Bruce  Permission granted to share info w AGENCY: SNF  Permission granted to share info w Relationship: son  Permission granted to share info w Contact Information: [place] [phone]  Emotional Assessment Appearance:: Appears stated age Attitude/Demeanor/Rapport: Gracious Affect (typically observed): Calm Orientation: : Oriented to Self, Oriented to Place, Oriented to  Time, Oriented to Situation Alcohol / Substance Use: Not Applicable Psych Involvement: No (comment)  Admission diagnosis:  Acute exacerbation of CHF (  congestive heart failure) (HCC) [I50.9] Acute on chronic congestive heart failure, unspecified heart failure type [facility]) [I50.9] Patient Active  Problem List   Diagnosis Date Noted   Acute on chronic diastolic CHF/HFpEF 29/19/1660   Hypermagnesemia 05/01/2022   Leukocytosis 05/01/2022   Hypoalbuminemia due to protein-calorie malnutrition ([place]) 05/01/2022   Obesity (BMI 30-39.9) 05/01/2022   NSTEMI (non-ST elevated myocardial infarction)  05/01/2022   Sepsis due to Rt Leg Cellulitis  05/01/2022   Hypoxia    Abdominal distension    Sinus pause 08/11/2021   Acute exacerbation of CHF (congestive heart failure) ([place]) 08/10/2021   Goals of care, counseling/discussion 08/10/2021   Acute respiratory failure ([place]) 08/10/2021   Diverticulitis    CAD (coronary artery disease)    Peripheral vascular disease (HCC)    GERD (gastroesophageal reflux disease)    Cancer (HCC)    Acute kidney injury superimposed on chronic kidney disease (HCC)    Arthritis    Acute myocardial infarction of other anterior wall, initial episode of care    Diabetes mellitus without complication ([place])    Essential hypertension    Atrial fibrillation ([place])    PCP:  System, Provider Not In Pharmacy:   [place], [place] [address] Phone: [phone] Fax: [phone]     Social Determinants of Health (SDOH) Interventions    Readmission Risk Interventions    08/11/2021    2:47 PM  Readmission Risk Prevention Plan  Transportation Screening Complete  Home Care Screening Complete  Medication Review (RN CM) Complete

## 2022-05-03 NOTE — Progress Notes (Signed)
Received a call from the patient's bedside RN regarding the patient having pain in his right lower extremity.  Presented at bedside.   On exam, the right lower extremity is tender, edematous, erythematous, and warm to the touch.  Provided as needed analgesics, ordered blood cultures x2 peripherally and started empiric Ancef due to concern for cellulitis.  MRSA screening test is pending.   05/03/22 RLE cellulitis    RLE cellulitis 05/03/22

## 2022-05-03 NOTE — Progress Notes (Addendum)
Rounding Note    Patient Name: Lawrence Harper Date of Encounter: 05/03/2022  [place] Cardiologist: Carlyle Dolly, MD  Also followed in [place] with cardiology plans to move to [place]   Slept better after meds last pm.  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  He is agreeable to see Dr. Harl Bowie in [place].  Inpatient Medications    Scheduled Meds:  aspirin EC  81 mg Oral Q breakfast   atorvastatin  40 mg Oral Daily   calcium-vitamin D  1 tablet Oral Q breakfast   feeding supplement  237 mL Oral BID BM   ferrous sulfate  325 mg Oral Q breakfast   folic acid  1 mg Oral Daily   furosemide  80 mg Intravenous Q12H   Gerhardt's butt cream   Topical QID   ipratropium-albuterol  3 mL Nebulization Q6H   metoprolol tartrate  12.5 mg Oral BID   senna-docusate  2 tablet Oral QHS   timolol  1 drop Both Eyes BID   Continuous Infusions:  sodium chloride 10 mL/hr at 05/03/22 0106    ceFAZolin (ANCEF) IV 1 g (05/03/22 0118)   heparin 1,400 Units/hr (05/02/22 1932)   PRN Meds: sodium chloride, acetaminophen, HYDROmorphone (DILAUDID) injection, ipratropium-albuterol, melatonin, oxyCODONE, polyethylene glycol   Vital Signs    Vitals:   05/02/22 1935 05/02/22 2042 05/03/22 0657 05/03/22 0742  BP:  127/75 114/78   Pulse:  85 91   Resp:   20   Temp:   98.7 F (37.1 C)   TempSrc:   Oral   SpO2: 100%  97% 96%  Weight:   104.3 kg   Height:        Intake/Output Summary (Last 24 hours) at 05/03/2022 0932 Last data filed at 05/03/2022 0725 Gross per 24 hour  Intake 578.85 ml  Output 1325 ml  Net -746.15 ml      05/03/2022    6:57 AM 05/02/2022    4:37 AM 05/01/2022    8:47 PM  Last 3 Weights  Weight (lbs) 230 lb 227 lb 15.3 oz 229 lb 15 oz  Weight (kg) 104.327 kg 103.4 kg 104.3 kg      Telemetry    Atrial fib with  PVCs- Personally Reviewed  ECG    No new - Personally  Reviewed  Physical Exam   GEN: No acute distress.   Neck: + JVD Cardiac: irreg irreg, no murmurs, rubs, or gallops.  Respiratory: tight breath sounds and diminished occ wheezes to auscultation bilaterally. GI: Soft, nontender, non-distended but tight with edema MS: 1-2+ edema; No deformity. Neuro:  Nonfocal  Psych: Normal affect   Labs    High Sensitivity Troponin:   Recent Labs  Lab 04/28/2022 2352 05/01/22 0136 05/01/22 0525 05/01/22 0723 05/01/22 1652  TROPONINIHS 31* 486* 920* 1,141* 1,314*     Chemistry Recent Labs  Lab 05/05/2022 1824 05/20/2022 1825 04/23/2022 2352 05/02/22 0916 05/03/22 0322  NA 137  --   --  135 132*  K 4.9  --   --  4.5 4.4  CL 103  --   --  101 101  CO2 25  --   --  26 21*  GLUCOSE 96  --   --  120* 103*  BUN 56*  --   --  66* 75*  CREATININE 2.34*  --   --  2.60* 2.63*  CALCIUM  9.2  --   --  8.5* 8.6*  MG  --   --  2.5*  --  2.3  PROT  --  8.5*  --   --   --   ALBUMIN  --  3.2*  --   --   --   AST  --  22  --   --   --   ALT  --  13  --   --   --   ALKPHOS  --  135*  --   --   --   BILITOT  --  1.5*  --   --   --   GFRNONAA 26*  --   --  23* 23*  ANIONGAP 9  --   --  8 10    Lipids No results for input(s): "CHOL", "TRIG", "HDL", "LABVLDL", "LDLCALC", "CHOLHDL" in the last 168 hours.  Hematology Recent Labs  Lab 05/01/22 0525 05/02/22 0916 05/03/22 0322  WBC 14.7* 10.3 9.0  RBC 3.52* 3.28* 3.31*  HGB 10.8* 10.3* 9.9*  HCT 34.7* 32.0* 32.1*  MCV 98.6 97.6 97.0  MCH 30.7 31.4 29.9  MCHC 31.1 32.2 30.8  RDW 19.6* 19.4* 18.9*  PLT 209 206 198   Thyroid No results for input(s): "TSH", "FREET4" in the last 168 hours.  BNP Recent Labs  Lab 05/13/2022 2352  BNP 2,619.0*    DDimer No results for input(s): "DDIMER" in the last 168 hours.   Radiology    ECHOCARDIOGRAM COMPLETE  Result Date: 05/01/2022    ECHOCARDIOGRAM REPORT   Patient Name:   CARDEN TEEL Date of Exam: 05/01/2022 Medical Rec #:  [id number]      Height:       65.0  in Accession #:    [id number]     Weight:       228.2 lb Date of Birth:  12-May-1932     BSA:          2.092 m Patient Age:    88 years       BP:           115/79 mmHg Patient Gender: M              HR:           73 bpm. Exam Location:  [facility] Procedure: 2D Echo, Color Doppler, Cardiac Doppler and Intracardiac            Opacification Agent Indications:    H84.69 Acute diastolic (congestive) heart failure  History:        Patient has prior history of Echocardiogram examinations, most                 recent 08/11/2021. CHF, Arrythmias:Atrial Fibrillation; Risk                 Factors:Hypertension, Diabetes and Dyslipidemia.  Sonographer:    Raquel Sarna Senior RDCS Referring Phys: [id number] OLADAPO ADEFESO  Sonographer Comments: Poor apical windows due to lung interference. IMPRESSIONS  1. Left ventricular ejection fraction, by estimation, is 50 to 55%. The left ventricle has low normal function. Left ventricular endocardial border not optimally defined to evaluate regional wall motion. Left ventricular diastolic parameters are indeterminate.  2. Right ventricular systolic function is severely reduced. The right ventricular size is severely enlarged. There is moderately elevated pulmonary artery systolic pressure.  3. Left atrial size was mildly dilated.  4. Right atrial size was severely dilated.  5. The mitral valve was not well visualized.  Mild mitral valve regurgitation. No evidence of mitral stenosis.  6. Severe TR due to RV and anular dilatation, lack of leaflet coaptation. . The tricuspid valve is abnormal. Tricuspid valve regurgitation is severe.  7. Mild to moderate aortic stenosis. Lower than expected gradient due to SVI 26, consistent with paradoxical low flow low gradient mild to moderate aortic stenosis. . The aortic valve has an indeterminant number of cusps. There is moderate calcification  of the aortic valve. There is moderate thickening of the aortic valve. Aortic valve regurgitation is not visualized.  Mild to moderate aortic valve stenosis. Aortic valve mean gradient measures 10.0 mmHg. Aortic valve peak gradient measures 13.7 mmHg. Aortic valve area, by VTI measures 1.32 cm. DI 0.42  8. The inferior vena cava is dilated in size with <50% respiratory variability, suggesting right atrial pressure of 15 mmHg. FINDINGS  Left Ventricle: Left ventricular ejection fraction, by estimation, is 50 to 55%. The left ventricle has low normal function. Left ventricular endocardial border not optimally defined to evaluate regional wall motion. Definity contrast agent was given IV  to delineate the left ventricular endocardial borders. The left ventricular internal cavity size was normal in size. There is no left ventricular hypertrophy. Left ventricular diastolic parameters are indeterminate. Right Ventricle: Ventricular septum is flattened in systole and diastole consistent with RV pressure and volume overload. The right ventricular size is severely enlarged. Right vetricular wall thickness was not well visualized. Right ventricular systolic  function is severely reduced. There is moderately elevated pulmonary artery systolic pressure. The tricuspid regurgitant velocity is 2.81 m/s, and with an assumed right atrial pressure of 15 mmHg, the estimated right ventricular systolic pressure is 11.9 mmHg. Left Atrium: Left atrial size was mildly dilated. Right Atrium: Right atrial size was severely dilated. Pericardium: There is no evidence of pericardial effusion. Mitral Valve: The mitral valve was not well visualized. There is mild thickening of the mitral valve leaflet(s). There is mild calcification of the mitral valve leaflet(s). Mild mitral annular calcification. Mild mitral valve regurgitation. No evidence of mitral valve stenosis. Tricuspid Valve: Severe TR due to RV and anular dilatation, lack of leaflet coaptation. The tricuspid valve is abnormal. Tricuspid valve regurgitation is severe. No evidence of tricuspid stenosis.  Aortic Valve: Mild to moderate aortic stenosis. Lower than expected gradient due to SVI 26, consistent with paradoxical low flow low gradient mild to moderate aortic stenosis. The aortic valve has an indeterminant number of cusps. There is moderate calcification of the aortic valve. There is moderate thickening of the aortic valve. There is moderate aortic valve annular calcification. Aortic valve regurgitation is not visualized. Mild to moderate aortic stenosis is present. Aortic valve mean gradient measures 10.0 mmHg. Aortic valve peak gradient measures 13.7 mmHg. Aortic valve area, by VTI measures 1.32 cm. Pulmonic Valve: The pulmonic valve was not well visualized. Pulmonic valve regurgitation is not visualized. No evidence of pulmonic stenosis. Aorta: The aortic root and ascending aorta are structurally normal, with no evidence of dilitation. Venous: The inferior vena cava is dilated in size with less than 50% respiratory variability, suggesting right atrial pressure of 15 mmHg. IAS/Shunts: The interatrial septum was not well visualized.  LEFT VENTRICLE PLAX 2D LVIDd:         5.40 cm   Diastology LVIDs:         3.70 cm   LV e' medial:    8.49 cm/s LV PW:         1.00 cm   LV E/e' medial:  12.5 LV IVS:        1.00 cm   LV e' lateral:   12.60 cm/s LVOT diam:     2.00 cm   LV E/e' lateral: 8.4 LV SV:         54 LV SV Index:   26 LVOT Area:     3.14 cm  RIGHT VENTRICLE RV S prime:     11.30 cm/s TAPSE (M-mode): 1.5 cm LEFT ATRIUM              Index        RIGHT ATRIUM           Index LA diam:        5.00 cm  2.39 cm/m   RA Area:     39.20 cm LA Vol (A2C):   105.0 ml 50.19 ml/m  RA Volume:   173.00 ml 82.69 ml/m LA Vol (A4C):   64.3 ml  30.73 ml/m LA Biplane Vol: 84.5 ml  40.39 ml/m  AORTIC VALVE                     PULMONIC VALVE AV Area (Vmax):    1.46 cm      RVOT Peak grad: 3 mmHg AV Area (Vmean):   1.35 cm AV Area (VTI):     1.32 cm AV Vmax:           185.00 cm/s AV Vmean:          136.000 cm/s AV  VTI:            0.410 m AV Peak Grad:      13.7 mmHg AV Mean Grad:      10.0 mmHg LVOT Vmax:         86.20 cm/s LVOT Vmean:        58.400 cm/s LVOT VTI:          0.172 m LVOT/AV VTI ratio: 0.42  AORTA Ao Root diam: 2.90 cm Ao Asc diam:  3.50 cm MITRAL VALVE                TRICUSPID VALVE MV Area (PHT): 4.01 cm     TR Peak grad:   31.6 mmHg MV Decel Time: 189 msec     TR Vmax:        281.00 cm/s MV E velocity: 106.00 cm/s MV A velocity: 34.70 cm/s   SHUNTS MV E/A ratio:  3.05         Systemic VTI:  0.17 m                             Systemic Diam: 2.00 cm                             Pulmonic VTI:  0.131 m Carlyle Dolly MD Electronically signed by Carlyle Dolly MD Signature Date/Time: 05/01/2022/12:55:09 PM    Final    US Venous Img Lower Unilateral Right (DVT)  Result Date: 05/01/2022 CLINICAL DATA:  Right lower extremity edema and pain EXAM: LEFT LOWER EXTREMITY VENOUS DOPPLER ULTRASOUND TECHNIQUE: Gray-scale sonography with compression, as well as color and duplex ultrasound, were performed to evaluate the deep venous system(s) from the level of the common femoral vein through the popliteal and proximal calf veins. COMPARISON:  None available FINDINGS: VENOUS Normal compressibility of the common femoral, superficial femoral, and popliteal veins, as well as  the visualized calf veins. Visualized portions of profunda femoral vein and great saphenous vein unremarkable. No filling defects to suggest DVT on grayscale or color Doppler imaging. Doppler waveforms show normal direction of venous flow, normal respiratory plasticity and response to augmentation. Limited views of the contralateral common femoral vein are unremarkable. OTHER None. Limitations: none IMPRESSION: No right lower extremity DVT. Electronically Signed   By: Miachel Roux M.D.   On: 05/01/2022 10:06    Cardiac Studies   Echo 05/01/22 IMPRESSIONS     1. Left ventricular ejection fraction, by estimation, is 50 to 55%. The  left ventricle has  low normal function. Left ventricular endocardial  border not optimally defined to evaluate regional wall motion. Left  ventricular diastolic parameters are  indeterminate.   2. Right ventricular systolic function is severely reduced. The right  ventricular size is severely enlarged. There is moderately elevated  pulmonary artery systolic pressure.   3. Left atrial size was mildly dilated.   4. Right atrial size was severely dilated.   5. The mitral valve was not well visualized. Mild mitral valve  regurgitation. No evidence of mitral stenosis.   6. Severe TR due to RV and anular dilatation, lack of leaflet coaptation.  . The tricuspid valve is abnormal. Tricuspid valve regurgitation is  severe.   7. Mild to moderate aortic stenosis. Lower than expected gradient due to  SVI 26, consistent with paradoxical low flow low gradient mild to moderate  aortic stenosis. . The aortic valve has an indeterminant number of cusps.  There is moderate calcification   of the aortic valve. There is moderate thickening of the aortic valve.  Aortic valve regurgitation is not visualized. Mild to moderate aortic  valve stenosis. Aortic valve mean gradient measures 10.0 mmHg. Aortic  valve peak gradient measures 13.7 mmHg.  Aortic valve area, by VTI measures 1.32 cm. DI 0.42   8. The inferior vena cava is dilated in size with <50% respiratory  variability, suggesting right atrial pressure of 15 mmHg.   FINDINGS   Left Ventricle: Left ventricular ejection fraction, by estimation, is 50  to 55%. The left ventricle has low normal function. Left ventricular  endocardial border not optimally defined to evaluate regional wall motion.  Definity contrast agent was given IV   to delineate the left ventricular endocardial borders. The left  ventricular internal cavity size was normal in size. There is no left  ventricular hypertrophy. Left ventricular diastolic parameters are  indeterminate.   Right Ventricle:  Ventricular septum is flattened in systole and diastole  consistent with RV pressure and volume overload. The right ventricular  size is severely enlarged. Right vetricular wall thickness was not well  visualized. Right ventricular systolic   function is severely reduced. There is moderately elevated pulmonary  artery systolic pressure. The tricuspid regurgitant velocity is 2.81 m/s,  and with an assumed right atrial pressure of 15 mmHg, the estimated right  ventricular systolic pressure is  94.7 mmHg.   Left Atrium: Left atrial size was mildly dilated.   Right Atrium: Right atrial size was severely dilated.   Pericardium: There is no evidence of pericardial effusion.   Mitral Valve: The mitral valve was not well visualized. There is mild  thickening of the mitral valve leaflet(s). There is mild calcification of  the mitral valve leaflet(s). Mild mitral annular calcification. Mild  mitral valve regurgitation. No evidence  of mitral valve stenosis.   Tricuspid Valve: Severe TR due to RV and anular dilatation, lack  of  leaflet coaptation. The tricuspid valve is abnormal. Tricuspid valve  regurgitation is severe. No evidence of tricuspid stenosis.   Aortic Valve: Mild to moderate aortic stenosis. Lower than expected  gradient due to SVI 26, consistent with paradoxical low flow low gradient  mild to moderate aortic stenosis. The aortic valve has an indeterminant  number of cusps. There is moderate  calcification of the aortic valve. There is moderate thickening of the  aortic valve. There is moderate aortic valve annular calcification. Aortic  valve regurgitation is not visualized. Mild to moderate aortic stenosis is  present. Aortic valve mean  gradient measures 10.0 mmHg. Aortic valve peak gradient measures 13.7  mmHg. Aortic valve area, by VTI measures 1.32 cm.   Pulmonic Valve: The pulmonic valve was not well visualized. Pulmonic valve  regurgitation is not visualized. No  evidence of pulmonic stenosis.   Aorta: The aortic root and ascending aorta are structurally normal, with  no evidence of dilitation.   Venous: The inferior vena cava is dilated in size with less than 50%  respiratory variability, suggesting right atrial pressure of 15 mmHg.   IAS/Shunts: The interatrial septum was not well visualized.  Patient Profile     88 y.o. male hx of essential hypertension, CAD s/p stent placement, chronic atrial fibrillation, CKD stage IV, chronic diastolic CHF and NSTEMI this admit and acute CHF and severe TR.    Assessment & Plan    HFpEF/Chronic RV failure/Severe TR  - admit 07/2021 40 lbs weight gain. Managed with lasix gtt with good response, basleine weight reportedly 200 lbs, during Jan 2023 appt he was 204 lbs. Admit wt 228 lbs  today wt is the same as admit  -I&O neg 904 had been on lasix 40 BID but beginning last pm increased to 80 BID IV  -BNP 2619 --Cr 2.63 with baseline Cr 2.3-2.7    04/2022 Echo: LVE 50-55%, cannot eval WMAs, severe RV enlargement and dysfunction with D shaped septum, PASP 47, severe TR     NSTEMI/CAD - NSTEMI in 2014 with DES to LAD - trop to 1314 without peak in setting of decompensated HF - EKG afib, RBBB, no acute ischemic changes  - not a cath candidate due to advanced CKD GFR 26, advanced age and comorbidities - plan medical management. Plan 48 hrs heparin which would conclude 05/03/22 in the AM. COntinue ASA 81, atorva 40, lopressor 12.'5mg'$  bid for now though had some pauses based on prior notes. No ACE/ARB given renal dysfunction.  -stop heparin and begin eliquis 2.5 BID        Permanent afib - off beta blocker due to prior pauses had up to 8 sec pauses in past. We have added back low dose will need to monitor. - eliquis was held on admit in case of invasive procedures, on hep gtt at this time, stop heparin today and use eliquis 2.5 BID --event monitor 10/28/21 with atrial fib, mini HR 48 and max 171, rare PVC NSVT 9  beats. No pauses >3 sec.       CKD IV -baseline Cr 2.3-2.7   Aortic stenosis Mild to moderate aortic stenosis. Lower than expected gradient due to  SVI 26, consistent with paradoxical low flow low gradient mild to moderate  aortic stenosis   Severe TR - function TR due to severely enlarged RV, anular dilatation with lack of coaptation  For questions or updates, please contact [place] Please consult [email or web address] for contact info under    Signed,  Cecilie Kicks, NP  05/03/2022, 9:32 AM    Patient seen and examined, note reviewed with the signed Advanced Practice Provider. I personally reviewed laboratory data, imaging studies and relevant notes. I independently examined the patient and formulated the important aspects of the plan. I have personally discussed the plan with the patient and/or family. Comments or changes to the note/plan are indicated below.  Patient seen and examined at his bedside. He was sitting up just from physical therapy.  Still appears to be volume overloaded clinically - continue same dose of IV Lasix today.  Please transition to oral anticoagulation - his dose  is Eliquis 2.'5mg'$  twice daily. No beta blockers for now.  Berniece Salines DO, MS [facility] Attending Cardiologist [place]  [address] [place], [place] [address] [phone] Website: [email or web address]

## 2022-05-03 NOTE — Consult Note (Addendum)
WOC consult requested for right leg wound.  This was already performed this morning; please refer to previous consult notes for assessment, and topical treatment orders have been provided for bedside nurses to perform. Please re-consult if further assistance is needed.  Thank-you,  Julien Girt MSN, [place], [place], [place], [place]

## 2022-05-03 NOTE — [place] FL2 (Signed)
[place] MEDICAID FL2 LEVEL OF CARE SCREENING TOOL     IDENTIFICATION  Patient Name: Lawrence Harper Birthdate: 10/04/1931 Sex: male Admission Date (Current Location): 04/29/2022  [facility] and [place] Number:  Herbalist and Address:  [facility] [place], [place] [address], [place], [place] [address]      Provider Number: [id number]  Attending Physician Name and Address:  Tawni Millers,*  Relative Name and Phone Number:  Josefine Class) [phone]    Current Level of Care: Hospital Recommended Level of Care: [place] Prior Approval Number:    Date Approved/Denied:   PASRR Number: [id number]  Discharge Plan: SNF    Current Diagnoses: Patient Active Problem List   Diagnosis Date Noted   Acute on chronic diastolic CHF/HFpEF 32/99/2426   Hypermagnesemia 05/01/2022   Leukocytosis 05/01/2022   Hypoalbuminemia due to protein-calorie malnutrition ([place]) 05/01/2022   Obesity (BMI 30-39.9) 05/01/2022   NSTEMI (non-ST elevated myocardial infarction)  05/01/2022   Sepsis due to Rt Leg Cellulitis  05/01/2022   Hypoxia    Abdominal distension    Sinus pause 08/11/2021   Acute exacerbation of CHF (congestive heart failure) ([place]) 08/10/2021   Goals of care, counseling/discussion 08/10/2021   Acute respiratory failure ([place]) 08/10/2021   Diverticulitis    CAD (coronary artery disease)    Peripheral vascular disease (HCC)    GERD (gastroesophageal reflux disease)    Cancer (HCC)    Acute kidney injury superimposed on chronic kidney disease (HCC)    Arthritis    Acute myocardial infarction of other anterior wall, initial episode of care    Diabetes mellitus without complication (HCC)    Essential hypertension    Atrial fibrillation (HCC)     Orientation RESPIRATION BLADDER Height & Weight     Self, Time, Situation, Place  Normal Continent Weight: 230 lb (104.3 kg) Height:  '5\' 5"'$  (165.1 cm)  BEHAVIORAL SYMPTOMS/MOOD NEUROLOGICAL  BOWEL NUTRITION STATUS      Incontinent Diet (Please see discharge summary)  AMBULATORY STATUS COMMUNICATION OF NEEDS Skin   Limited Assist Verbally Other (Comment) (Appropriate for ethnicity,dry,abrasion,ear,nose,leg,bilateral,foam dressing to R leg,Ecchymosis,arm,leg,bilateral,erythema,leg,R,lower,weeping,leg,R,lower,foam,non-tenting,wound incision LDAs)                       Personal Care Assistance Level of Assistance  Bathing, Feeding, Dressing Bathing Assistance: Maximum assistance Feeding assistance: Independent (can feed self) Dressing Assistance: Maximum assistance     Functional Limitations Info  Sight, Hearing, Speech Sight Info: Impaired Hearing Info: Adequate Speech Info: Adequate    SPECIAL CARE FACTORS FREQUENCY  PT (By licensed PT), OT (By licensed OT)     PT Frequency: 5x min weekly OT Frequency: 5x min weekly            Contractures Contractures Info: Not present    Additional Factors Info  Code Status, Allergies Code Status Info: DNR Allergies Info: No Known Allergies           Current Medications (05/03/2022):  This is the current hospital active medication list Current Facility-Administered Medications  Medication Dose Route Frequency Provider Last Rate Last Admin   0.9 %  sodium chloride infusion   Intravenous PRN Irene Pap N, DO   Stopped at 05/03/22 0700   acetaminophen (TYLENOL) tablet 650 mg  650 mg Oral Q6H PRN Kayleen Memos, DO       apixaban (ELIQUIS) tablet 2.5 mg  2.5 mg Oral BID Isaiah Serge, NP   2.5 mg at  05/03/22 0948   aspirin EC tablet 81 mg  81 mg Oral Q breakfast Emokpae, Courage, MD   81 mg at 05/03/22 0815   atorvastatin (LIPITOR) tablet 40 mg  40 mg Oral Daily Emokpae, Courage, MD   40 mg at 05/03/22 0815   calcium-vitamin D (OSCAL WITH D) 500-5 MG-MCG per tablet 1 tablet  1 tablet Oral Q breakfast Emokpae, Courage, MD   1 tablet at 05/03/22 0815   ceFAZolin (ANCEF) IVPB 1 g/50 mL premix  1 g Intravenous Q12H  Einar Grad, RPH 100 mL/hr at 05/03/22 1038 1 g at 05/03/22 1038   feeding supplement (ENSURE ENLIVE / ENSURE PLUS) liquid 237 mL  237 mL Oral BID BM Emokpae, Courage, MD   237 mL at 05/03/22 0816   ferrous sulfate tablet 325 mg  325 mg Oral Q breakfast Emokpae, Courage, MD   325 mg at 29/52/84 1324   folic acid (FOLVITE) tablet 1 mg  1 mg Oral Daily Emokpae, Courage, MD   1 mg at 05/03/22 0815   furosemide (LASIX) injection 80 mg  80 mg Intravenous Q12H Arnoldo Lenis, MD   80 mg at 05/03/22 0815   Gerhardt's butt cream   Topical QID Kayleen Memos, DO   Given at 05/03/22 0815   HYDROmorphone (DILAUDID) injection 0.5 mg  0.5 mg Intravenous Q4H PRN Irene Pap N, DO   0.5 mg at 05/03/22 0054   ipratropium-albuterol (DUONEB) 0.5-2.5 (3) MG/3ML nebulizer solution 3 mL  3 mL Nebulization Q2H PRN Arrien, Jimmy Picket, MD   3 mL at 05/03/22 0003   ipratropium-albuterol (DUONEB) 0.5-2.5 (3) MG/3ML nebulizer solution 3 mL  3 mL Nebulization Q6H Arrien, Jimmy Picket, MD   3 mL at 05/03/22 0741   leptospermum manuka honey (MEDIHONEY) paste 1 Application  1 Application Topical Daily Tawni Millers, MD   1 Application at 40/10/27 1100   melatonin tablet 5 mg  5 mg Oral QHS PRN Irene Pap N, DO       metoprolol tartrate (LOPRESSOR) tablet 12.5 mg  12.5 mg Oral BID Emokpae, Courage, MD   12.5 mg at 05/03/22 0815   oxyCODONE (Oxy IR/ROXICODONE) immediate release tablet 5 mg  5 mg Oral Q6H PRN Irene Pap N, DO   5 mg at 05/03/22 0943   polyethylene glycol (MIRALAX / GLYCOLAX) packet 17 g  17 g Oral Daily PRN Irene Pap N, DO       senna-docusate (Senokot-S) tablet 2 tablet  2 tablet Oral QHS Irene Pap N, DO   2 tablet at 05/03/22 0054   timolol (TIMOPTIC) 0.5 % ophthalmic solution 1 drop  1 drop Both Eyes BID Roxan Hockey, MD   1 drop at 05/03/22 0815     Discharge Medications: Please see discharge summary for a list of discharge medications.  Relevant Imaging  Results:  Relevant Lab Results:   Additional Information [phone]  Milas Gain, LCSWA

## 2022-05-03 NOTE — Progress Notes (Signed)
PHARMACY NOTE:  ANTIMICROBIAL RENAL DOSAGE ADJUSTMENT  Current antimicrobial regimen includes a mismatch between antimicrobial dosage and estimated renal function.  As per policy approved by the Pharmacy & Therapeutics and Medical Executive Committees, the antimicrobial dosage will be adjusted accordingly.  Current antimicrobial dosage:  cefazolin 2g IV q8h  Indication: cellulitis  Renal Function:  Estimated Creatinine Clearance: 22.2 mL/min (A) (by C-G formula based on SCr of 2.6 mg/dL (H)). '[]'$      On intermittent HD, scheduled: '[]'$      On CRRT    Antimicrobial dosage has been changed to: cefazolin 1g IV q12  Additional comments:   Thank you for allowing pharmacy to be a part of this patient's care.  Einar Grad, [facility] 05/03/2022 12:52 AM

## 2022-05-03 NOTE — Consult Note (Signed)
WOC Nurse Consult Note: Reason for Consult:Cellulitis to right lower leg.  Acute on chronic CHF, currently on Lasix to diurese.  Nonintact linear abrasion to right anterior lower leg.  Yellow fibrin to wound bed.  Wound type:trauma, infectious Pressure Injury POA: NA Measurement: 4 cm x 0.2 cm  Wound EAK:LTYVDP Drainage (amount, consistency, odor) minimal serosanguinous Periwound: edema to lower leg, nonpitting Dressing procedure/placement/frequency: CLeanse right lower leg wound with NS and pat dry.  Apply Medihoney to wound bed.  Cover with dry gauze and kerlix/tape.  No tape on skin. Change daily. ELevate legs.  Will not follow at this time.  Please re-consult if needed.  Domenic Moras MSN, RN, FNP-BC CWON Wound, Ostomy, Continence Nurse Pager [phone]

## 2022-05-03 NOTE — Evaluation (Signed)
Occupational Therapy Evaluation Patient Details Name: Lawrence Harper MRN: [id number] DOB: 11-Mar-1932 Today's Date: 05/03/2022   History of Present Illness Patient is a 88 y/o male who presents on 05/06/2022 with fatigue, weakness and SOB. Admitted with acute on chronic diastolic CHF, NSTEMI and sepsis secondary to LE cellulitis. PMH includes paroxysmal A-fib, CAD, HTN, obesity, CKD, HF, DM, PVD.   Clinical Impression   Prior to this admission, patient living with his 28 yo wife, walking with a cane as needed, and independent in ADLs. Patient reports that he is still driving. 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 Currently, patient presenting with decreased activity tolerance, need for mod A for ADLs, and mod A for transfers to complete stand pivot to [facility] and bed. Patient fatiguing quickly in OT session, requiring seated rest breaks, but VSS on RA. OT recommending SNF at discharge to increase overall independence toward prior level of function. OT will continue to follow acutely.    Recommendations for follow up therapy are one component of a multi-disciplinary discharge planning process, led by the attending physician.  Recommendations may be updated based on patient status, additional functional criteria and insurance authorization.   Follow Up Recommendations  Skilled nursing-short term rehab (<3 hours/day)    Assistance Recommended at Discharge Intermittent Supervision/Assistance  Patient can return home with the following A lot of help with walking and/or transfers;A lot of help with bathing/dressing/bathroom;Assist for transportation;Help with stairs or ramp for entrance;Assistance with cooking/housework    Functional Status Assessment  Patient has had a recent decline in their functional status and demonstrates the ability to make significant improvements in function in a reasonable and predictable amount of time.  Equipment  Recommendations  Other (comment) (Defer to next venue)    Recommendations for Other Services       Precautions / Restrictions Precautions Precautions: Fall Restrictions Weight Bearing Restrictions: No      Mobility Bed Mobility Overal bed mobility: Needs Assistance Bed Mobility: Supine to Sit     Supine to sit: Min guard     General bed mobility comments: use of bed rails and extra time to complete    Transfers Overall transfer level: Needs assistance Equipment used: Rolling walker (2 wheels) Transfers: Sit to/from Stand, Bed to chair/wheelchair/BSC Sit to Stand: Mod assist Stand pivot transfers: Mod assist         General transfer comment: Mod A to power to standing with cues for hand placement/technique (lowered surface). Has electric bed at home.      Balance Overall balance assessment: Needs assistance Sitting-balance support: Bilateral upper extremity supported, Feet supported Sitting balance-Leahy Scale: Fair     Standing balance support: During functional activity, Reliant on assistive device for balance Standing balance-Leahy Scale: Poor                             ADL either performed or assessed with clinical judgement   ADL Overall ADL's : Needs assistance/impaired Eating/Feeding: Set up;Sitting   Grooming: Set up;Sitting   Upper Body Bathing: Minimal assistance;Sitting   Lower Body Bathing: Moderate assistance;Maximal assistance;Sitting/lateral leans;Sit to/from stand   Upper Body Dressing : Minimal assistance;Sitting   Lower Body Dressing: Moderate assistance;Maximal assistance;Sitting/lateral leans;Sit to/from stand Lower Body Dressing Details (indicate cue type and reason): patient uses a sock aide at baseline Toilet Transfer: Moderate assistance;Rolling walker (2 wheels);BSC/3in1 Toilet Transfer Details (indicate cue type and reason):  stand pivot to [facility], mod A to power up from bed, cues for hand placement  throughout Toileting- Clothing Manipulation and Hygiene: Total assistance;Sit to/from stand Toileting - Clothing Manipulation Details (indicate cue type and reason): patient unable to balance to complete peri-care while holding onto walker     Functional mobility during ADLs: Moderate assistance;Rolling walker (2 wheels);Cueing for sequencing;Cueing for safety General ADL Comments: Patient presenting with decreased activity tolerance, need for mod A for ADLs, and mod A for transfers to complete stand pivot to [facility] and bed.     Vision Baseline Vision/History: 1 Wears glasses (Reading) Ability to See in Adequate Light: 0 Adequate Patient Visual Report: No change from baseline       Perception     Praxis      Pertinent Vitals/Pain Pain Assessment Pain Assessment: 0-10 Pain Score: 4  Pain Location: R leg Pain Descriptors / Indicators: Guarding, Grimacing Pain Intervention(s): Limited activity within patient's tolerance, Monitored during session, Patient requesting pain meds-RN notified, RN gave pain meds during session     Hand Dominance Right   Extremity/Trunk Assessment Upper Extremity Assessment Upper Extremity Assessment: Defer to OT evaluation   Lower Extremity Assessment Lower Extremity Assessment: Generalized weakness   Cervical / Trunk Assessment Cervical / Trunk Assessment: Kyphotic   Communication Communication Communication: No difficulties   Cognition Arousal/Alertness: Awake/alert Behavior During Therapy: WFL for tasks assessed/performed Overall Cognitive Status: Within Functional Limits for tasks assessed                                       General Comments  VSS on RA, Some mild redness and swelling RLE.    Exercises     Shoulder Instructions      Home Living Family/patient expects to be discharged to:: Private residence Living Arrangements: Spouse/significant other Available Help at Discharge: Family;Available 24  hours/day;Available PRN/intermittently Type of Home: House Home Access: Stairs to enter [employer] of Steps: 2 Entrance Stairs-Rails: Right Home Layout: Two level;Able to live on main level with bedroom/bathroom Alternate Level Stairs-Number of Steps: elevator   Bathroom Shower/Tub: Occupational psychologist: Handicapped height Bathroom Accessibility: Yes   Home Equipment: Conservation officer, nature (2 wheels);Wheelchair - Regulatory affairs officer - single point;Grab bars - toilet;Grab bars - tub/shower          Prior Functioning/Environment Prior Level of Function : Independent/Modified Independent;Driving             Mobility Comments: walking with cane, uses RW for community distance as needed, 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 ADLs Comments: independent        OT Problem List: Decreased strength;Decreased range of motion;Decreased activity tolerance;Impaired balance (sitting and/or standing);Decreased coordination;Decreased safety awareness;Pain;Increased edema;Cardiopulmonary status limiting activity      OT Treatment/Interventions: Self-care/ADL training;Therapeutic exercise;Energy conservation;DME and/or AE instruction;Therapeutic activities;Patient/family education;Balance training;Manual therapy    OT Goals(Current goals can be found in the care plan section) Acute Rehab OT Goals Patient Stated Goal: to get some rehab and get stronger OT Goal Formulation: With patient Time For Goal Achievement: 05/17/22 Potential to Achieve Goals: Good ADL Goals Pt Will Perform Lower Body Bathing: with adaptive equipment;Independently Pt Will Perform Lower Body Dressing: Independently;with adaptive equipment Pt Will Transfer to Toilet: with min guard assist;ambulating Pt Will Perform Toileting - Clothing Manipulation and hygiene: Independently;sitting/lateral leans;sit to/from stand Additional ADL Goal #1: Patient will demonstrate increased activity tolerance to complete  functional task in standing for 3-5 minutes in preparation for safe discharge home. Additional ADL Goal #2: Patient will be able to verbalize 3 strategies for energy conservation and fall prevention in preparation for safe discharge home.  OT Frequency: Min 2X/week    Co-evaluation              AM-PAC OT "6 Clicks" Daily Activity     Outcome Measure Help from another person eating meals?: A Little Help from another person taking care of personal grooming?: A Little Help from another person toileting, which includes using toliet, bedpan, or urinal?: A Lot Help from another person bathing (including washing, rinsing, drying)?: A Lot Help from another person to put on and taking off regular upper body clothing?: A Little Help from another person to put on and taking off regular lower body clothing?: A Lot 6 Click Score: 15   End of Session Equipment Utilized During Treatment: Gait belt;Rolling walker (2 wheels) Nurse Communication: Mobility status  Activity Tolerance: Patient tolerated treatment well Patient left: in bed;with call bell/phone within reach;Other (comment);with bed alarm set (handed off to PT sitting EOB)  OT Visit Diagnosis: Unsteadiness on feet (R26.81);Other abnormalities of gait and mobility (R26.89);History of falling (Z91.81);Muscle weakness (generalized) (M62.81);Pain Pain - Right/Left: Right Pain - part of body: Leg                Time: 3582-5189 OT Time Calculation (min): 31 min Charges:  OT General Charges $OT Visit: 1 Visit OT Evaluation $OT Eval Moderate Complexity: 1 Mod OT Treatments $Self Care/Home Management : 8-22 mins  [place] E. Kaiyah Eber, OTR/L Acute Rehabilitation Services [phone]   Ascencion Dike 05/03/2022, 11:22 AM

## 2022-05-04 ENCOUNTER — Inpatient Hospital Stay (HOSPITAL_COMMUNITY): Payer: Medicare (Managed Care)

## 2022-05-04 DIAGNOSIS — I5033 Acute on chronic diastolic (congestive) heart failure: Secondary | ICD-10-CM | POA: Diagnosis not present

## 2022-05-04 DIAGNOSIS — N179 Acute kidney failure, unspecified: Secondary | ICD-10-CM | POA: Diagnosis not present

## 2022-05-04 DIAGNOSIS — I10 Essential (primary) hypertension: Secondary | ICD-10-CM | POA: Diagnosis not present

## 2022-05-04 DIAGNOSIS — J449 Chronic obstructive pulmonary disease, unspecified: Secondary | ICD-10-CM

## 2022-05-04 DIAGNOSIS — I214 Non-ST elevation (NSTEMI) myocardial infarction: Secondary | ICD-10-CM | POA: Diagnosis not present

## 2022-05-04 LAB — BASIC METABOLIC PANEL
Anion gap: 9 (ref 5–15)
BUN: 82 mg/dL — ABNORMAL HIGH (ref 8–23)
CO2: 25 mmol/L (ref 22–32)
Calcium: 8.7 mg/dL — ABNORMAL LOW (ref 8.9–10.3)
Chloride: 99 mmol/L (ref 98–111)
Creatinine, Ser: 2.79 mg/dL — ABNORMAL HIGH (ref 0.61–1.24)
GFR, Estimated: 21 mL/min — ABNORMAL LOW (ref >60)
Glucose, Bld: 111 mg/dL — ABNORMAL HIGH (ref 70–99)
Potassium: 4.1 mmol/L (ref 3.5–5.1)
Sodium: 133 mmol/L — ABNORMAL LOW (ref 135–145)

## 2022-05-04 LAB — BLOOD GAS, ARTERIAL
Acid-Base Excess: 3.1 mmol/L — ABNORMAL HIGH (ref 0.0–2.0)
Bicarbonate: 27 mmol/L (ref 20.0–28.0)
O2 Saturation: 100 %
Patient temperature: 36.9
pCO2 arterial: 38 mmHg (ref 32–48)
pH, Arterial: 7.46 — ABNORMAL HIGH (ref 7.35–7.45)
pO2, Arterial: 107 mmHg (ref 83–108)

## 2022-05-04 LAB — COMPREHENSIVE METABOLIC PANEL
ALT: 33 U/L (ref 0–44)
AST: 59 U/L — ABNORMAL HIGH (ref 15–41)
Albumin: 2.6 g/dL — ABNORMAL LOW (ref 3.5–5.0)
Alkaline Phosphatase: 173 U/L — ABNORMAL HIGH (ref 38–126)
Anion gap: 15 (ref 5–15)
BUN: 92 mg/dL — ABNORMAL HIGH (ref 8–23)
CO2: 25 mmol/L (ref 22–32)
Calcium: 8.9 mg/dL (ref 8.9–10.3)
Chloride: 95 mmol/L — ABNORMAL LOW (ref 98–111)
Creatinine, Ser: 2.85 mg/dL — ABNORMAL HIGH (ref 0.61–1.24)
GFR, Estimated: 21 mL/min — ABNORMAL LOW (ref >60)
Glucose, Bld: 163 mg/dL — ABNORMAL HIGH (ref 70–99)
Potassium: 4.2 mmol/L (ref 3.5–5.1)
Sodium: 135 mmol/L (ref 135–145)
Total Bilirubin: 1 mg/dL (ref 0.3–1.2)
Total Protein: 8 g/dL (ref 6.5–8.1)

## 2022-05-04 LAB — CBC WITH DIFFERENTIAL/PLATELET
Abs Immature Granulocytes: 0.05 10*3/uL (ref 0.00–0.07)
Basophils Absolute: 0 10*3/uL (ref 0.0–0.1)
Basophils Relative: 1 %
Eosinophils Absolute: 0.3 10*3/uL (ref 0.0–0.5)
Eosinophils Relative: 3 %
HCT: 33.7 % — ABNORMAL LOW (ref 39.0–52.0)
Hemoglobin: 10.5 g/dL — ABNORMAL LOW (ref 13.0–17.0)
Immature Granulocytes: 1 %
Lymphocytes Relative: 8 %
Lymphs Abs: 0.6 10*3/uL — ABNORMAL LOW (ref 0.7–4.0)
MCH: 30.4 pg (ref 26.0–34.0)
MCHC: 31.2 g/dL (ref 30.0–36.0)
MCV: 97.7 fL (ref 80.0–100.0)
Monocytes Absolute: 0.5 10*3/uL (ref 0.1–1.0)
Monocytes Relative: 6 %
Neutro Abs: 7 10*3/uL (ref 1.7–7.7)
Neutrophils Relative %: 81 %
Platelets: 227 10*3/uL (ref 150–400)
RBC: 3.45 MIL/uL — ABNORMAL LOW (ref 4.22–5.81)
RDW: 18.6 % — ABNORMAL HIGH (ref 11.5–15.5)
WBC: 8.4 10*3/uL (ref 4.0–10.5)
nRBC: 0.5 % — ABNORMAL HIGH (ref 0.0–0.2)

## 2022-05-04 LAB — MAGNESIUM
Magnesium: 2.3 mg/dL (ref 1.7–2.4)
Magnesium: 2.4 mg/dL (ref 1.7–2.4)

## 2022-05-04 LAB — PHOSPHORUS: Phosphorus: 5.1 mg/dL — ABNORMAL HIGH (ref 2.5–4.6)

## 2022-05-04 LAB — TROPONIN T: Troponin T (Highly Sensitive): 231 ng/L — ABNORMAL HIGH (ref 0–22)

## 2022-05-04 LAB — PROCALCITONIN: Procalcitonin: 1.53 ng/mL

## 2022-05-04 MED ORDER — PROCHLORPERAZINE EDISYLATE 10 MG/2ML IJ SOLN
INTRAMUSCULAR | Status: AC
  Filled 2022-05-04: qty 2

## 2022-05-04 MED ORDER — PANTOPRAZOLE SODIUM 40 MG IV SOLR
40.0000 mg | INTRAVENOUS | Status: DC
  Administered 2022-05-04 – 2022-05-05 (×2): 40 mg via INTRAVENOUS
  Filled 2022-05-04 (×2): qty 10

## 2022-05-04 MED ORDER — GUAIFENESIN-DM 100-10 MG/5ML PO SYRP
5.0000 mL | ORAL_SOLUTION | ORAL | Status: DC | PRN
  Administered 2022-05-04: 5 mL via ORAL
  Filled 2022-05-04: qty 5

## 2022-05-04 MED ORDER — PROCHLORPERAZINE EDISYLATE 10 MG/2ML IJ SOLN
5.0000 mg | Freq: Four times a day (QID) | INTRAMUSCULAR | Status: DC | PRN
  Administered 2022-05-04: 5 mg via INTRAVENOUS

## 2022-05-04 MED ORDER — PROCHLORPERAZINE EDISYLATE 10 MG/2ML IJ SOLN
5.0000 mg | INTRAMUSCULAR | Status: DC | PRN
  Administered 2022-05-05: 5 mg via INTRAVENOUS
  Filled 2022-05-04: qty 2

## 2022-05-04 MED ORDER — SODIUM CHLORIDE 0.9 % IV SOLN: 3.0000 g | Freq: Two times a day (BID) | INTRAVENOUS | Status: DC

## 2022-05-04 MED ORDER — SODIUM CHLORIDE 0.9 % IV SOLN
1.0000 g | Freq: Two times a day (BID) | INTRAVENOUS | Status: DC
  Administered 2022-05-04 – 2022-05-05 (×2): 1 g via INTRAVENOUS
  Filled 2022-05-04 (×3): qty 20

## 2022-05-04 MED ORDER — MOMETASONE FURO-FORMOTEROL FUM 200-5 MCG/ACT IN AERO
2.0000 | INHALATION_SPRAY | Freq: Two times a day (BID) | RESPIRATORY_TRACT | Status: DC
  Filled 2022-05-04: qty 8.8

## 2022-05-04 MED ORDER — IPRATROPIUM-ALBUTEROL 0.5-2.5 (3) MG/3ML IN SOLN: 3.0000 mL | RESPIRATORY_TRACT | Status: DC

## 2022-05-04 MED ORDER — FUROSEMIDE 10 MG/ML IJ SOLN
80.0000 mg | Freq: Once | INTRAMUSCULAR | Status: AC
  Administered 2022-05-04: 80 mg via INTRAVENOUS
  Filled 2022-05-04: qty 8

## 2022-05-04 NOTE — Progress Notes (Addendum)
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  He is agreeable to see Dr. Harl Bowie in [place].  Inpatient Medications    Scheduled Meds:  apixaban  2.5 mg Oral BID   aspirin EC  81 mg Oral Q breakfast   atorvastatin  40 mg Oral Daily   calcium-vitamin D  1 tablet Oral Q breakfast   cephALEXin  250 mg Oral Q12H   feeding supplement  237 mL Oral BID BM   ferrous sulfate  325 mg Oral Q breakfast   folic acid  1 mg Oral Daily   furosemide  80 mg Intravenous Q12H   Gerhardt's butt cream   Topical QID   ipratropium-albuterol  3 mL Nebulization Q6H   leptospermum manuka honey  1 Application Topical Daily   metoprolol tartrate  12.5 mg Oral BID   senna-docusate  2 tablet Oral QHS   timolol  1 drop Both Eyes BID   Continuous Infusions:  sodium chloride Stopped (05/03/22 0700)   PRN Meds: sodium chloride, acetaminophen, HYDROmorphone (DILAUDID) injection, ipratropium-albuterol, melatonin, oxyCODONE, polyethylene glycol   Vital Signs    Vitals:   05/03/22 1957 05/04/22 0137 05/04/22 0500 05/04/22 0722  BP: 124/79  (!) 136/114   Pulse: 92 81 (!) 56   Resp:  14    Temp: 98.5 F (36.9 C)  98.8 F (37.1 C)   TempSrc: Oral  Oral   SpO2: 100% 96% 97% 98%  Weight:   104.3 kg   Height:        Intake/Output Summary (Last 24 hours) at 05/04/2022 0827 Last data filed at 05/04/2022 0532 Gross per 24 hour  Intake 480 ml  Output 1600 ml  Net -1120 ml      05/04/2022    5:00 AM 05/03/2022    6:57 AM 05/02/2022    4:37 AM  Last 3 Weights  Weight (lbs) 230 lb 230 lb 227 lb 15.3 oz  Weight (kg) 104.327 kg 104.327 kg 103.4 kg      Telemetry    Atrial fib, rate controlled with  PVCs- Personally  Reviewed  ECG    No new - Personally Reviewed  Physical Exam   GEN: No acute distress.   Neck: + JVD to jaw Cardiac: irreg irreg, 2/6 murmurs, rubs, or gallops.  Respiratory: tight breath sounds and diminished occ wheezes to auscultation bilaterally. GI: distended, firm, tender MS:  trace-1+ edema L, RLE wrapped but edema present; No deformity. Neuro:  Nonfocal  Psych: Normal affect   Labs    High Sensitivity Troponin:   Recent Labs  Lab 04/25/2022 2352 05/01/22 0136 05/01/22 0525 05/01/22 0723 05/01/22 1652  TROPONINIHS 31* 486* 920* 1,141* 1,314*     Chemistry Recent Labs  Lab 05/18/2022 1825 04/23/2022 2352 05/02/22 0916 05/03/22 0322 05/04/22 0419  NA  --   --  135 132* 133*  K  --   --  4.5 4.4 4.1  CL  --   --  101 101 99  CO2  --   --  26 21* 25  GLUCOSE  --   --  120* 103* 111*  BUN  --   --  66* 75* 82*  CREATININE  --   --  2.60* 2.63* 2.79*  CALCIUM  --   --  8.5* 8.6* 8.7*  MG  --  2.5*  --  2.3 2.3  PROT 8.5*  --   --   --   --   ALBUMIN 3.2*  --   --   --   --   AST 22  --   --   --   --   ALT 13  --   --   --   --   ALKPHOS 135*  --   --   --   --   BILITOT 1.5*  --   --   --   --   GFRNONAA  --   --  23* 23* 21*  ANIONGAP  --   --  '8 10 9    '$ Lipids No results for input(s): "CHOL", "TRIG", "HDL", "LABVLDL", "LDLCALC", "CHOLHDL" in the last 168 hours.  Hematology Recent Labs  Lab 05/01/22 0525 05/02/22 0916 05/03/22 0322  WBC 14.7* 10.3 9.0  RBC 3.52* 3.28* 3.31*  HGB 10.8* 10.3* 9.9*  HCT 34.7* 32.0* 32.1*  MCV 98.6 97.6 97.0  MCH 30.7 31.4 29.9  MCHC 31.1 32.2 30.8  RDW 19.6* 19.4* 18.9*  PLT 209 206 198   Thyroid No results for input(s): "TSH", "FREET4" in the last 168 hours.  BNP Recent Labs  Lab 04/29/2022 2352  BNP 2,619.0*    DDimer No results for input(s): "DDIMER" in the last 168 hours.   Radiology    No results found.  Cardiac Studies   Echo 05/01/22 IMPRESSIONS   1. Left ventricular ejection fraction, by  estimation, is 50 to 55%. The  left ventricle has low normal function. Left ventricular endocardial  border not optimally defined to evaluate regional wall motion. Left  ventricular diastolic parameters are indeterminate.   2. Right ventricular systolic function is severely reduced. The right  ventricular size is severely enlarged. There is moderately elevated  pulmonary artery systolic pressure.   3. Left atrial size was mildly dilated.   4. Right atrial size was severely dilated.   5. The mitral valve was not well visualized. Mild mitral valve  regurgitation. No evidence of mitral stenosis.   6. Severe TR due to RV and anular dilatation, lack of leaflet coaptation.  . The tricuspid valve is abnormal. Tricuspid valve regurgitation is  severe.   7. Mild to moderate aortic stenosis. Lower than expected gradient due to  SVI 26, consistent with paradoxical low flow low gradient mild to moderate  aortic stenosis. . The aortic valve has an indeterminant number of cusps.  There is moderate calcification   of the aortic valve. There is moderate thickening of the aortic valve.  Aortic valve regurgitation is not visualized. Mild to moderate aortic  valve stenosis. Aortic valve mean gradient measures 10.0 mmHg. Aortic  valve peak gradient measures 13.7 mmHg.  Aortic valve area, by VTI measures 1.32 cm. DI 0.42   8. The inferior vena cava is dilated in size with <50% respiratory  variability, suggesting right atrial pressure of 15 mmHg.   FINDINGS   Left Ventricle: Left ventricular ejection fraction, by estimation, is 50  to 55%. The left ventricle has low normal function. Left ventricular  endocardial border not optimally defined to evaluate regional wall motion.  Definity contrast agent was given IV   to delineate the left ventricular endocardial borders. The left  ventricular internal cavity size was normal in size. There is no left  ventricular hypertrophy. Left ventricular  diastolic  parameters are  indeterminate.   Right Ventricle: Ventricular septum is flattened in systole and diastole  consistent with RV pressure and volume overload. The right ventricular  size is severely enlarged. Right vetricular wall thickness was not well  visualized. Right ventricular systolic   function is severely reduced. There is moderately elevated pulmonary  artery systolic pressure. The tricuspid regurgitant velocity is 2.81 m/s,  and with an assumed right atrial pressure of 15 mmHg, the estimated right  ventricular systolic pressure is  27.0 mmHg.   Left Atrium: Left atrial size was mildly dilated.   Right Atrium: Right atrial size was severely dilated.   Pericardium: There is no evidence of pericardial effusion.   Mitral Valve: The mitral valve was not well visualized. There is mild  thickening of the mitral valve leaflet(s). There is mild calcification of  the mitral valve leaflet(s). Mild mitral annular calcification. Mild  mitral valve regurgitation. No evidence  of mitral valve stenosis.   Tricuspid Valve: Severe TR due to RV and anular dilatation, lack of  leaflet coaptation. The tricuspid valve is abnormal. Tricuspid valve  regurgitation is severe. No evidence of tricuspid stenosis.   Aortic Valve: Mild to moderate aortic stenosis. Lower than expected  gradient due to SVI 26, consistent with paradoxical low flow low gradient  mild to moderate aortic stenosis. The aortic valve has an indeterminant  number of cusps. There is moderate  calcification of the aortic valve. There is moderate thickening of the  aortic valve. There is moderate aortic valve annular calcification. Aortic  valve regurgitation is not visualized. Mild to moderate aortic stenosis is  present. Aortic valve mean  gradient measures 10.0 mmHg. Aortic valve peak gradient measures 13.7  mmHg. Aortic valve area, by VTI measures 1.32 cm.   Pulmonic Valve: The pulmonic valve was not well visualized.  Pulmonic valve  regurgitation is not visualized. No evidence of pulmonic stenosis.   Aorta: The aortic root and ascending aorta are structurally normal, with  no evidence of dilitation.   Venous: The inferior vena cava is dilated in size with less than 50%  respiratory variability, suggesting right atrial pressure of 15 mmHg.   IAS/Shunts: The interatrial septum was not well visualized.  Patient Profile     88 y.o. male hx of essential hypertension, CAD s/p stent placement, chronic atrial fibrillation, CKD stage IV, chronic diastolic CHF and NSTEMI this admit and acute CHF and severe TR.    Assessment & Plan    HFpEF/Chronic RV failure/Severe TR  - admit 07/2021 40 lbs weight gain. Managed with lasix gtt with good response, basleine weight reportedly 200 lbs, during Jan 2023 appt he was 204 lbs. Admit wt 228 lbs  today wt is 230, will make sure they are doing standing wts -I&O neg 2 L, had been on lasix 40 BID but beginning 09/10, increased to 80 BID IV  -BNP 2619 on 09/08 -- BUN/Cr 82/2.79 with baseline Cr 2.3-2.7, trending up w/ diuresis    05/01/2022 Echo: LVE 50-55%, cannot eval WMAs, severe RV enlargement and dysfunction with D shaped septum, PASP 47, severe TR     NSTEMI/CAD - NSTEMI in 2014 with DES to LAD - trop to 1314 without peak in setting of decompensated HF - EKG afib, RBBB, no acute ischemic changes  - not a cath candidate due to CKD GFR 26, advanced age and comorbidities - plan medical management. Plan 48 hrs heparin which ended 05/03/22  -- Continue ASA 81, atorva 40, lopressor  12.'5mg'$  bid for now though had some pauses based on prior notes. No ACE/ARB given renal dysfunction.  - once heparin stopped, eliquis 2.5 BID started Permanent afib - off beta blocker due to prior pauses had up to 8 sec pauses in past. We have added back low dose will need to monitor. - eliquis was held on admit in case of invasive procedures, now back on eliquis 2.5 BID --event monitor 10/28/21  with atrial fib, mini HR 48 and max 171, rare PVC NSVT 9 beats. No pauses >3 sec.      CKD IV -baseline Cr 2.3-2.7 Aortic stenosis -- Mild to moderate aortic stenosis. Lower than expected gradient due to  SVI 26 -- this is consistent with paradoxical low flow low gradient mild to moderate  aortic stenosis Severe TR - function TR due to severely enlarged RV, anular dilatation with lack of coaptation   For questions or updates, please contact [place] Please consult [email or web address] for contact info under  Signed, Rosaria Ferries, PA-C  05/04/2022, 8:27 AM    Patient seen and examined, note reviewed with the signed Advanced Practice Provider. I personally reviewed laboratory data, imaging studies and relevant notes. I independently examined the patient and formulated the important aspects of the plan. I have personally discussed the plan with the patient and/or family. Comments or changes to the note/plan are indicated below.  We will continue his IV diuretics for now. Clinically his abdomen is slightly distended compared to yesterday.  He would benefit from abdominal ultrasound. We will continue to treat medically for his coronary artery disease. We will monitor him closely on the low-dose beta-blocker given his paroxysmal atrial fibrillation.  Please continue Eliquis.   Berniece Salines DO, MS [facility] Attending Cardiologist [facility]  [address] [place], [place] [address] [phone] Website: [email or web address]

## 2022-05-04 NOTE — Progress Notes (Addendum)
6:30 pm Patient with worsening dyspnea and increased work of breathing.  Positive wheezing and dry cough. Oxygenation continue to be stable at 100% on 2 L/min per [place]  ABG with pH 7.46/38/107/27/100 Chest radiograph with cardiomegaly and bilateral interstitial infiltrates. (Personally reviewed).  Plan: Add extra dose of furosemide 80 mg IVx1 Continue with bronchodilator therapy Add Bipap to improve work of breathing.  Continue close monitoring.

## 2022-05-04 NOTE — Progress Notes (Signed)
Mobility Specialist - Progress Note   05/04/22 1143  Mobility  Activity Transferred from chair to bed  Level of Assistance Minimal assist, patient does 75% or more  Assistive Device Front wheel walker  Distance Ambulated (ft) 20 ft  Activity Response Tolerated well  $Mobility charge 1 Mobility    Pre-mobility: 76 HR, 92% SpO2 During mobility:101 HR, 91% SpO2 Post-mobility:82 HR,97% SpO2  Pt was received in bed and agreeable to transfer. No c/o pain throughout. Pt was left in chair with all needs met and chair alarm on.   Larey Seat

## 2022-05-04 NOTE — Progress Notes (Signed)
Pharmacy Antibiotic Note  Lawrence Harper is a 88 y.o. male admitted on 05/10/2022 with decompensated HF. Now with pneumonia.  Pharmacy has been consulted for Meropenem dosing. Noted   Plan: Meropenem 1gm IV q12 Will f/u renal function, micro data, and pt's clinical condition  Height: '5\' 5"'$  (165.1 cm) Weight: 104.3 kg (230 lb) IBW/kg (Calculated) : 61.5  Temp (24hrs), Avg:98.9 F (37.2 C), Min:98.8 F (37.1 C), Max:98.9 F (37.2 C)  Recent Labs  Lab 04/26/2022 1824 05/01/22 0525 05/02/22 0916 05/03/22 0322 05/04/22 0419  WBC 12.3* 14.7* 10.3 9.0  --   CREATININE 2.34*  --  2.60* 2.63* 2.79*    Estimated Creatinine Clearance: 20.7 mL/min (A) (by C-G formula based on SCr of 2.79 mg/dL (H)).    No Known Allergies  Antimicrobials this admission: 9/12 Meropenem >>  9/11 Cephalexin >> 9/12 9/9 Vanc x 1  Microbiology results: 9/11 BCx:  9/11 MRSA PCR: negative  Thank you for allowing pharmacy to be a part of this patient's care.  Sherlon Handing, PharmD, BCPS Please see amion for complete clinical pharmacist phone list 05/04/2022 9:31 PM

## 2022-05-04 NOTE — TOC Progression Note (Addendum)
Transition of Care [facility]) - Progression Note    Patient Details  Name: Lawrence Harper MRN: [id number] Date of Birth: February 28, 1932  Transition of Care [facility]) CM/SW [place], [place] Phone Number: 05/04/2022, 11:33 AM  Clinical Narrative:     Update- 4:06pm- CSW spoke with [name] with [facility] who reports patients insurance is out of network. [name] submitted clinicals for special 1 time case contract for patient for possible SNF placement for patient.CSW to follow up with [name] with [facility] tomorrow .   CSW spoke with patient at bedside and provided SNF bed offers. CSW informed patient. CSW spoke with [name] with [facility] who confirmed cannot offer SNF bed for patient. Patients insurance is not in network, [place] chose SNF bed at [facility]. CSW spoke with [name] with [facility] who is going to see if they are in network with patients insurance. Patient called his daughter [name] CSW in room .[name] had questions regarding PCP and HTA for patient. CSW informed [name] patient can call number on back of his insurance card to locate pcp that is in network. CSW informed patients daughter if interested in HTA she can go online to find additional information about the insurance or call HTA for any additional questions she has. CSW will continue to follow and assist with patients dc planning needs.  Expected Discharge Plan: [place] Barriers to Discharge: Continued Medical Work up  Expected Discharge Plan and Services Expected Discharge Plan: [place] In-house Referral: Clinical Social Work     Living arrangements for the past 2 months: Single Family Home                                       Social Determinants of Health (SDOH) Interventions    Readmission Risk Interventions    08/11/2021    2:47 PM  Readmission Risk Prevention Plan  Transportation Screening Complete  Home Care Screening Complete  Medication  Review (RN CM) Complete

## 2022-05-04 NOTE — Progress Notes (Signed)
Dyspnea at rest throughout shift, abdomen very distended/gross ascites.

## 2022-05-04 NOTE — Progress Notes (Signed)
Physical Therapy Treatment Patient Details Name: KAMARII CARTON MRN: [id number] DOB: 02/05/1932 Today's Date: 05/04/2022   History of Present Illness Patient is a 88 y/o male who presents on 04/24/2022 with fatigue, weakness and SOB. Workup for acute on chronic diastolic CHF, NSTEMI and sepsis secondary to RLE cellulitis. PMH includes PAF, CAD, HTN, obesity, CKD, HF, DM, PVD.   PT Comments    Pt progressing with mobility, though limited by c/o increased R knee pain this session. Pt tolerated bouts of seated and standing LE therex, but declined further ambulation secondary to pain and having just returned to bed. Pt remains limited by generalized weakness, decreased activity tolerance, and impaired balance strategies/postural reactions. Continue to recommend SNF-level therapies to maximize functional mobility and independence prior to return home.    Recommendations for follow up therapy are one component of a multi-disciplinary discharge planning process, led by the attending physician.  Recommendations may be updated based on patient status, additional functional criteria and insurance authorization.  Follow Up Recommendations  Skilled nursing-short term rehab (<3 hours/day) Can patient physically be transported by private vehicle: Yes   Assistance Recommended at Discharge Frequent or constant Supervision/Assistance  Patient can return home with the following A lot of help with walking and/or transfers;A little help with bathing/dressing/bathroom;Help with stairs or ramp for entrance;Assist for transportation;Assistance with cooking/housework   Equipment Recommendations  None recommended by PT    Recommendations for Other Services  Mobility Specialist     Precautions / Restrictions Precautions Precautions: Fall;Other (comment) Precaution Comments: Watch SpO2 (does not wear O2 baseline) Restrictions Weight Bearing Restrictions: No     Mobility  Bed Mobility Overal bed mobility: Needs  Assistance Bed Mobility: Supine to Sit, Sit to Supine     Supine to sit: Min assist, HOB elevated Sit to supine: Supervision   General bed mobility comments: minA for HHA to elevate trunk, increased time and effort secondary to R knee pain; return to supine without assist    Transfers Overall transfer level: Needs assistance Equipment used: Rolling walker (2 wheels) Transfers: Sit to/from Stand Sit to Stand: Min assist, From elevated surface           General transfer comment: pt requesting bed height elevated (has electric bed at home), increased time and effort to stand with minA to stabilize RW    Ambulation/Gait Ambulation/Gait assistance: Min guard   Assistive device: Rolling walker (2 wheels)       Pre-gait activities: steps forwards/backwards, side steps and marching at EOB with RW and min guard General Gait Details:  (pt declined ambulation secondary to R knee but agreeable to pre-gait and standing LE therex)   Stairs             Wheelchair Mobility    Modified Rankin (Stroke Patients Only)       Balance Overall balance assessment: Needs assistance Sitting-balance support: Feet supported, No upper extremity supported Sitting balance-Leahy Scale: Fair     Standing balance support: During functional activity, Reliant on assistive device for balance Standing balance-Leahy Scale: Poor                              Cognition Arousal/Alertness: Awake/alert Behavior During Therapy: WFL for tasks assessed/performed Overall Cognitive Status: Within Functional Limits for tasks assessed  Exercises General Exercises - Lower Extremity Long Arc Quad: AROM, Both, Seated (full R knee ext limited by pain) Hip Flexion/Marching: AROM, Both, Standing (painful accepting weight onto RLE)    General Comments General comments (skin integrity, edema, etc.): difficulty getting reliable pulse ox  reading via finger probe; pt on 2L O2 [place], SpO2 reading 88% with good pleth briefly while standing. pt reports increased R knee pain today which he attributes to "the inflammation" as this has never happened before      Pertinent Vitals/Pain Pain Assessment Pain Assessment: Faces Faces Pain Scale: Hurts whole lot Pain Location: R knee Pain Descriptors / Indicators: Guarding, Grimacing, Moaning, Discomfort Pain Intervention(s): Monitored during session, Limited activity within patient's tolerance, Repositioned    Home Living                          Prior Function            PT Goals (current goals can now be found in the care plan section) Progress towards PT goals: Progressing toward goals    Frequency    Min 3X/week      PT Plan Current plan remains appropriate    Co-evaluation              AM-PAC PT "6 Clicks" Mobility   Outcome Measure  Help needed turning from your back to your side while in a flat bed without using bedrails?: A Little Help needed moving from lying on your back to sitting on the side of a flat bed without using bedrails?: A Little Help needed moving to and from a bed to a chair (including a wheelchair)?: A Little Help needed standing up from a chair using your arms (e.g., wheelchair or bedside chair)?: A Little Help needed to walk in hospital room?: Total Help needed climbing 3-5 steps with a railing? : Total 6 Click Score: 14    End of Session Equipment Utilized During Treatment: Gait belt Activity Tolerance: Patient limited by pain Patient left: in bed;with call bell/phone within reach;with bed alarm set Nurse Communication: Mobility status PT Visit Diagnosis: Pain;Muscle weakness (generalized) (M62.81);Difficulty in walking, not elsewhere classified (R26.2) Pain - Right/Left: Right Pain - part of body: Leg     Time: 1414-1436 PT Time Calculation (min) (ACUTE ONLY): 22 min  Charges:  $Therapeutic Exercise: 8-22 mins                      Mabeline Caras, PT, DPT Acute Rehabilitation Services  Personal: [place] [facility] Office: [place] 05/04/2022, 3:52 PM

## 2022-05-04 NOTE — Progress Notes (Addendum)
Progress Note   Patient: Lawrence Harper TUU:[id number] DOB: [birth year] DOA: 05/14/2022     3 DOS: the patient was seen and examined on 05/04/2022   Brief hospital course: Lawrence Harper was admitted to the hospital with the working diagnosis of decompensated heart failure.   88 yo male with the past medical history of hypertension, coronary artery disease, atrial fibrillation, CKD stage IV and chronic diastolic heart failure who presented with fatigue. Reported 2 weeks of dyspnea, lower extremity edema, increased abdominal girth and orthopnea. On his initial physical examination his blood pressure was 136/73, HR 84, RR 18 and 02 saturation 100%, lungs with rales bilaterally, with no wheezing, heart with S1 and S2 present irregularly irregular, abdomen not distended, positive lower extremity edema.   Na 137, K 4,9, Cl 103, bicarbonate 25, glucose 96 bun 56 cr 2,34  ALK P 135, T bil 1,5  BNP 2,619  High sensitive troponin 31 and 486  Wbc 12.3 hgb 11.4 plt 227  Sars covid 19 negative   Urine analysis with SG 1,013, 30 protein, 0-5 wbc   Chest radiograph with cardiomegaly and mild bilateral hilar vascular congestion, no infiltrates.   EKG 90 bpm, normal axis, right bundle branch block, atrial fibrillation rhythm with small q V1 to V3, with no significant ST segment or T wave changes (old changes).   Patient was placed on furosemide for diuresis.  He was placed on heparin for NSTEMI.   09/11 his volume status has been improving.  Oral antibiotic therapy for right leg cellulitis.  09/12 abdominal with mild to moderate ascites.   Assessment and Plan: * Acute on chronic diastolic CHF/HFpEF Echocardiogram with preserved LV systolic function 50 to 79%X RV systolic function with severe reduction, RV severe cavity enlargement, RVSP 46,6 mmHg, RA with severe dilatation, severe TR, mild to moderate aortic stenosis,   Acute on chronic core pulmonale Pulmonary hypertension,   Urine output is 5.056 ml   Systolic blood pressure 979 mmHg. US abdomen with mild to moderate ascites.   Plan to continue diuresis with furosemide 80 mg IV q12  Tolerating well metoprolol.  Limited pharmacologic options due to renal failure.  Acute hypoxemic respiratory failure.ruled out.   NSTEMI (non-ST elevated myocardial infarction)  Coronary artery disease. Patient received medical therapy with IV heparin with good toleration.   Continue metoprolol, aspirin and atorvastatin.    Acute kidney injury superimposed on chronic kidney disease ([place]) CKD stage IV. Hyponatremia.  Renal function with serum cr at 2,79 with K at 4,1 and serum bicarbonate at 22.  Mg is 2,3 Na 133   Plan to continue diuresis with furosemide   Chronic anemia, due to chronic renal disease, Iron deficiency hgb has been stable, continue with oral iron supplementation.    Essential hypertension Continue with metoprolol, and diuresis with furosemide.   Atrial fibrillation (HCC) Continue rate control with metoprolol and anticoagulation with apixaban.   COPD (chronic obstructive pulmonary disease) ([place]) Suspected COPD Plan to continue with bronchodilator therapy and will add inhaled corticosteroid.   Sepsis due to Rt Leg Cellulitis  Sepsis ruled out.   Possible component of vascular insufficiency combined with local skin infection Continue with oral cephalexin for 8 days.    Hypoalbuminemia due to protein-calorie malnutrition ([place]) Continue with nutritional supplements.  Low albuminemia.   Obesity (BMI 30-39.9) Calculated BMI is 37,9         Subjective: Patient continue to have dyspnea, it has improved but not back to baseline, continue to have lower  extremity edema   Physical Exam: Vitals:   05/04/22 0500 05/04/22 0722 05/04/22 1236 05/04/22 1455  BP: (!) 136/114  110/67   Pulse: (!) 56  71   Resp:   16   Temp: 98.8 F (37.1 C)  98.9 F (37.2 C)   TempSrc: Oral  Oral   SpO2: 97% 98% 99% 100%  Weight:  104.3 kg     Height:       Neurology awake and alert ENT with mild pallor Cardiovascular with S1 and S2 present, irregularly irregular, with positive systolic murmur at the left sternal border Mild JVD Positive lower extremity edema ++ pitting Respiratory with scattered rales and proximal airway wheezing Abdomen distended but not tender, clinically no significant ascites.  Skin right leg discoloration continue to be stable  Data Reviewed:    Family Communication: no family at the bedside   Disposition: Status is: Inpatient Remains inpatient appropriate because: heart failure in IV furosemide   Planned Discharge Destination: Skilled nursing facility      Author: Tawni Millers, MD 05/04/2022 2:59 PM  For on call review [email or web address].

## 2022-05-04 NOTE — Assessment & Plan Note (Signed)
Suspected COPD Plan to continue with bronchodilator therapy and will add inhaled corticosteroid.

## 2022-05-04 NOTE — Progress Notes (Signed)
Heart Failure Navigator Progress Note  Following this hospitalization to assess for HV TOC readiness.  ? Watch,  EF 37-94 % diastolic HF CKD 4, cellulitis RLE Acute CHf and severe TR ?  Earnestine Leys, BSN, Clinical cytogeneticist Only

## 2022-05-04 NOTE — Progress Notes (Signed)
Patient had an increase in work of breathing this evening; using accessory muscles, wheezing, increased cough, and tachypnea. Oxygen saturations remained 100% on 2LNC. Additional nebulizer treatment and cough medicine was given. Patient still seemed to be in distress and more lethargic, not able to speak in full sentences, stating "I'm tired." Dr. Cathlean Sauer was notified and placed orders for ABG, CXR, additional IV lasix, and BiPAP. Bedside report given to oncoming RN, Nira Conn.

## 2022-05-05 DIAGNOSIS — I5033 Acute on chronic diastolic (congestive) heart failure: Secondary | ICD-10-CM | POA: Diagnosis not present

## 2022-05-05 DIAGNOSIS — Z515 Encounter for palliative care: Secondary | ICD-10-CM | POA: Diagnosis not present

## 2022-05-05 LAB — BASIC METABOLIC PANEL
Anion gap: 12 (ref 5–15)
BUN: 94 mg/dL — ABNORMAL HIGH (ref 8–23)
CO2: 25 mmol/L (ref 22–32)
Calcium: 8.7 mg/dL — ABNORMAL LOW (ref 8.9–10.3)
Chloride: 98 mmol/L (ref 98–111)
Creatinine, Ser: 2.88 mg/dL — ABNORMAL HIGH (ref 0.61–1.24)
GFR, Estimated: 20 mL/min — ABNORMAL LOW (ref >60)
Glucose, Bld: 108 mg/dL — ABNORMAL HIGH (ref 70–99)
Potassium: 4.3 mmol/L (ref 3.5–5.1)
Sodium: 135 mmol/L (ref 135–145)

## 2022-05-05 LAB — CBC
HCT: 35.5 % — ABNORMAL LOW (ref 39.0–52.0)
Hemoglobin: 10.7 g/dL — ABNORMAL LOW (ref 13.0–17.0)
MCH: 30 pg (ref 26.0–34.0)
MCHC: 30.1 g/dL (ref 30.0–36.0)
MCV: 99.4 fL (ref 80.0–100.0)
Platelets: 234 10*3/uL (ref 150–400)
RBC: 3.57 MIL/uL — ABNORMAL LOW (ref 4.22–5.81)
RDW: 18.8 % — ABNORMAL HIGH (ref 11.5–15.5)
WBC: 14.1 10*3/uL — ABNORMAL HIGH (ref 4.0–10.5)
nRBC: 0.4 % — ABNORMAL HIGH (ref 0.0–0.2)

## 2022-05-05 MED ORDER — ACETAMINOPHEN 325 MG PO TABS: 650.0000 mg | ORAL_TABLET | Freq: Four times a day (QID) | ORAL | Status: DC | PRN

## 2022-05-05 MED ORDER — POLYVINYL ALCOHOL 1.4 % OP SOLN: 1.0000 [drp] | Freq: Four times a day (QID) | OPHTHALMIC | Status: DC | PRN

## 2022-05-05 MED ORDER — BISACODYL 10 MG RE SUPP: 10.0000 mg | Freq: Every day | RECTAL | Status: DC | PRN

## 2022-05-05 MED ORDER — ONDANSETRON HCL 4 MG/2ML IJ SOLN: 4.0000 mg | Freq: Four times a day (QID) | INTRAMUSCULAR | Status: DC | PRN

## 2022-05-05 MED ORDER — LORAZEPAM 2 MG/ML IJ SOLN
0.5000 mg | INTRAMUSCULAR | Status: DC | PRN
  Administered 2022-05-06: 0.5 mg via INTRAVENOUS
  Filled 2022-05-05: qty 1

## 2022-05-05 MED ORDER — HYDROMORPHONE HCL 1 MG/ML IJ SOLN
0.5000 mg | INTRAMUSCULAR | Status: AC
  Administered 2022-05-05: 0.5 mg via INTRAVENOUS
  Filled 2022-05-05: qty 1

## 2022-05-05 MED ORDER — HYDROMORPHONE HCL 1 MG/ML IJ SOLN
0.5000 mg | INTRAMUSCULAR | Status: DC | PRN
  Administered 2022-05-05 – 2022-05-07 (×10): 1 mg via INTRAVENOUS
  Filled 2022-05-05 (×10): qty 1

## 2022-05-05 MED ORDER — HALOPERIDOL LACTATE 5 MG/ML IJ SOLN: 0.5000 mg | INTRAMUSCULAR | Status: DC | PRN

## 2022-05-05 MED ORDER — HYDROMORPHONE HCL 1 MG/ML IJ SOLN: 0.5000 mg | INTRAMUSCULAR | Status: DC | PRN

## 2022-05-05 MED ORDER — BIOTENE DRY MOUTH MT LIQD: 15.0000 mL | OROMUCOSAL | Status: DC | PRN

## 2022-05-05 MED ORDER — GLYCOPYRROLATE 0.2 MG/ML IJ SOLN
0.4000 mg | Freq: Three times a day (TID) | INTRAMUSCULAR | Status: DC
  Administered 2022-05-05 (×2): 0.4 mg via INTRAVENOUS
  Filled 2022-05-05 (×2): qty 2

## 2022-05-05 MED ORDER — HALOPERIDOL LACTATE 2 MG/ML PO CONC: 0.5000 mg | ORAL | Status: DC | PRN

## 2022-05-05 MED ORDER — HALOPERIDOL 0.5 MG PO TABS: 0.5000 mg | ORAL_TABLET | ORAL | Status: DC | PRN

## 2022-05-05 MED ORDER — ACETAMINOPHEN 650 MG RE SUPP: 650.0000 mg | Freq: Four times a day (QID) | RECTAL | Status: DC | PRN

## 2022-05-05 NOTE — TOC Progression Note (Addendum)
Transition of Care [facility]) - Progression Note    Patient Details  Name: Lawrence Harper MRN: [id number] Date of Birth: [birth year]  Transition of Care [facility]) CM/SW [place], [place] Phone Number: 05/05/2022, 1:31 PM  Clinical Narrative:     TOC will continue to follow and assist with patients dc planning needs. Palliative following.  Expected Discharge Plan: [place] Barriers to Discharge: Continued Medical Work up  Expected Discharge Plan and Services Expected Discharge Plan: [place] In-house Referral: Clinical Social Work     Living arrangements for the past 2 months: Single Family Home                                       Social Determinants of Health (SDOH) Interventions    Readmission Risk Interventions    08/11/2021    2:47 PM  Readmission Risk Prevention Plan  Transportation Screening Complete  Home Care Screening Complete  Medication Review (RN CM) Complete

## 2022-05-05 NOTE — Progress Notes (Signed)
OT Cancellation Note  Patient Details Name: Lawrence Harper MRN: [id number] DOB: 1932-04-27   Cancelled Treatment:    Reason Eval/Treat Not Completed: Other (comment) Patient's family politely declining any therapy for patient at this time. Family is awaiting palliative consult. OT will follow back as time permits.   Corinne Ports E. Marqueze Ramcharan, OTR/L Acute Rehabilitation Services [phone]   Ascencion Dike 05/05/2022, 9:56 AM

## 2022-05-05 NOTE — Progress Notes (Addendum)
Rounding Note    Patient Name: Lawrence Harper Date of Encounter: 05/05/2022  [place] Cardiologist: Carlyle Dolly, MD  Also followed in [place] with cardiology plans to move to [place]   Pt now on HFNC, very sleepy, but appropriate when roused. Got 0.5 mg dilaudid overnight. Seems much worse than yesterday  Inpatient Medications    Scheduled Meds:  apixaban  2.5 mg Oral BID   aspirin EC  81 mg Oral Q breakfast   atorvastatin  40 mg Oral Daily   calcium-vitamin D  1 tablet Oral Q breakfast   feeding supplement  237 mL Oral BID BM   ferrous sulfate  325 mg Oral Q breakfast   folic acid  1 mg Oral Daily   furosemide  80 mg Intravenous Q12H   ipratropium-albuterol  3 mL Nebulization Q6H   ipratropium-albuterol  3 mL Nebulization STAT   leptospermum manuka honey  1 Application Topical Daily   metoprolol tartrate  12.5 mg Oral BID   mometasone-formoterol  2 puff Inhalation BID   pantoprazole (PROTONIX) IV  40 mg Intravenous Q24H   prochlorperazine       senna-docusate  2 tablet Oral QHS   timolol  1 drop Both Eyes BID   Continuous Infusions:  sodium chloride 10 mL/hr at 05/04/22 2255   meropenem (MERREM) IV 1 g (05/05/22 0841)   PRN Meds: sodium chloride, acetaminophen, guaiFENesin-dextromethorphan, ipratropium-albuterol, melatonin, oxyCODONE, polyethylene glycol, prochlorperazine, prochlorperazine   Vital Signs    Vitals:   05/05/22 0239 05/05/22 0314 05/05/22 0612 05/05/22 0827  BP: (!) 145/95 136/78    Pulse: (!) 120 (!) 114  (!) 110  Resp:    18  Temp:      TempSrc:      SpO2: 99% 100% 96% 97%  Weight:      Height:        Intake/Output Summary (Last 24 hours) at 05/05/2022 0846 Last data filed at 05/04/2022 1800 Gross per 24 hour  Intake 1440 ml  Output 600 ml  Net 840 ml      05/04/2022    5:00 AM 05/03/2022    6:57 AM 05/02/2022    4:37 AM  Last 3 Weights  Weight (lbs) 230 lb 230 lb 227 lb 15.3 oz  Weight (kg)  104.327 kg 104.327 kg 103.4 kg      Telemetry    Atrial fib, rate controlled with  PVCs- Personally Reviewed  ECG    No new - Personally Reviewed  Physical Exam   GEN: sig resp distress, rouses to verbal and touch Neck: + JVD to jaw Cardiac: irreg irreg, 2/6 murmurs, rubs, or gallops.  Respiratory: tight breath sounds and diminished, wheezes bilaterally. GI: distended, very firm, tender MS:  trace-1+ edema L, RLE wrapped but edema present; No deformity. Neuro:  Nonfocal  Psych: sleepy but when roused, recognizes family   Labs    High Sensitivity Troponin:   Recent Labs  Lab 05/09/2022 2352 05/01/22 0136 05/01/22 0525 05/01/22 0723 05/01/22 1652  TROPONINIHS 31* 486* 920* 1,141* 1,314*     Chemistry Recent Labs  Lab 05/05/2022 1825 04/29/2022 2352 05/03/22 0322 05/04/22 0419 05/04/22 2144 05/05/22 0435  NA  --    < > 132* 133* 135 135  K  --    < > 4.4 4.1 4.2 4.3  CL  --    < > 101 99 95* 98  CO2  --    < > 21* 25 25  25  GLUCOSE  --    < > 103* 111* 163* 108*  BUN  --    < > 75* 82* 92* 94*  CREATININE  --    < > 2.63* 2.79* 2.85* 2.88*  CALCIUM  --    < > 8.6* 8.7* 8.9 8.7*  MG  --    < > 2.3 2.3 2.4  --   PROT 8.5*  --   --   --  8.0  --   ALBUMIN 3.2*  --   --   --  2.6*  --   AST 22  --   --   --  59*  --   ALT 13  --   --   --  33  --   ALKPHOS 135*  --   --   --  173*  --   BILITOT 1.5*  --   --   --  1.0  --   GFRNONAA  --    < > 23* 21* 21* 20*  ANIONGAP  --    < > '10 9 15 12   '$ < > = values in this interval not displayed.    Lipids No results for input(s): "CHOL", "TRIG", "HDL", "LABVLDL", "LDLCALC", "CHOLHDL" in the last 168 hours.  Hematology Recent Labs  Lab 05/03/22 0322 05/04/22 2144 05/05/22 0435  WBC 9.0 8.4 14.1*  RBC 3.31* 3.45* 3.57*  HGB 9.9* 10.5* 10.7*  HCT 32.1* 33.7* 35.5*  MCV 97.0 97.7 99.4  MCH 29.9 30.4 30.0  MCHC 30.8 31.2 30.1  RDW 18.9* 18.6* 18.8*  PLT 198 227 234   Thyroid No results for input(s): "TSH", "FREET4"  in the last 168 hours.  BNP Recent Labs  Lab 04/27/2022 2352  BNP 2,619.0*    DDimer No results for input(s): "DDIMER" in the last 168 hours.   Radiology    DG Abd Portable 1V  Result Date: 05/04/2022 CLINICAL DATA:  Abdominal distension, vomiting EXAM: PORTABLE ABDOMEN - 1 VIEW COMPARISON:  None Available. FINDINGS: There is gaseous distention of the stomach. Large and small bowel decompressed. No visible organomegaly or free air. Prior cholecystectomy. IMPRESSION: Gaseous distention of the stomach. Electronically Signed   By: Rolm Baptise M.D.   On: 05/04/2022 22:10   DG CHEST PORT 1 VIEW  Result Date: 05/04/2022 CLINICAL DATA:  Aspiration. EXAM: PORTABLE CHEST 1 VIEW COMPARISON:  Chest x-ray 05/04/2022 FINDINGS: The heart is enlarged, unchanged. There are left basilar opacities with likely small left pleural effusion. There is no pneumothorax or acute fracture. IMPRESSION: 1. Left basilar atelectasis/airspace disease with likely small left pleural effusion. 2. Stable cardiomegaly. Electronically Signed   By: Ronney Asters M.D.   On: 05/04/2022 22:09   DG Chest 1 View  Result Date: 05/04/2022 CLINICAL DATA:  Dyspnea. EXAM: CHEST  1 VIEW COMPARISON:  Chest x-ray 08/10/2021 FINDINGS: Heart is enlarged. There is a stable small left pleural effusion. Left basilar infiltrates can not be excluded. No evidence for pneumothorax or acute fracture. IMPRESSION: 1. Stable small left pleural effusion. Can not exclude left basilar atelectasis/airspace disease. 2. Stable cardiomegaly. Electronically Signed   By: Ronney Asters M.D.   On: 05/04/2022 19:14   US Abdomen Limited  Result Date: 05/04/2022 CLINICAL DATA:  Ascites. EXAM: LIMITED ABDOMEN ULTRASOUND FOR ASCITES TECHNIQUE: Limited ultrasound survey for ascites was performed in all four abdominal quadrants. COMPARISON:  None Available. FINDINGS: Mild to moderate amount of ascites is noted in the right lower, left upper and left  lower quadrants. No  definite ascites is noted in the right upper quadrant. IMPRESSION: Mild to moderate ascites is noted as described above. Electronically Signed   By: Marijo Conception M.D.   On: 05/04/2022 10:53    Cardiac Studies   Echo 05/01/22 IMPRESSIONS   1. Left ventricular ejection fraction, by estimation, is 50 to 55%. The  left ventricle has low normal function. Left ventricular endocardial  border not optimally defined to evaluate regional wall motion. Left  ventricular diastolic parameters are indeterminate.   2. Right ventricular systolic function is severely reduced. The right  ventricular size is severely enlarged. There is moderately elevated  pulmonary artery systolic pressure.   3. Left atrial size was mildly dilated.   4. Right atrial size was severely dilated.   5. The mitral valve was not well visualized. Mild mitral valve  regurgitation. No evidence of mitral stenosis.   6. Severe TR due to RV and anular dilatation, lack of leaflet coaptation.  . The tricuspid valve is abnormal. Tricuspid valve regurgitation is  severe.   7. Mild to moderate aortic stenosis. Lower than expected gradient due to  SVI 26, consistent with paradoxical low flow low gradient mild to moderate  aortic stenosis. . The aortic valve has an indeterminant number of cusps.  There is moderate calcification   of the aortic valve. There is moderate thickening of the aortic valve.  Aortic valve regurgitation is not visualized. Mild to moderate aortic  valve stenosis. Aortic valve mean gradient measures 10.0 mmHg. Aortic  valve peak gradient measures 13.7 mmHg.  Aortic valve area, by VTI measures 1.32 cm. DI 0.42   8. The inferior vena cava is dilated in size with <50% respiratory  variability, suggesting right atrial pressure of 15 mmHg.   FINDINGS   Left Ventricle: Left ventricular ejection fraction, by estimation, is 50  to 55%. The left ventricle has low normal function. Left ventricular  endocardial border not  optimally defined to evaluate regional wall motion.  Definity contrast agent was given IV   to delineate the left ventricular endocardial borders. The left  ventricular internal cavity size was normal in size. There is no left  ventricular hypertrophy. Left ventricular diastolic parameters are  indeterminate.   Right Ventricle: Ventricular septum is flattened in systole and diastole  consistent with RV pressure and volume overload. The right ventricular  size is severely enlarged. Right vetricular wall thickness was not well  visualized. Right ventricular systolic   function is severely reduced. There is moderately elevated pulmonary  artery systolic pressure. The tricuspid regurgitant velocity is 2.81 m/s,  and with an assumed right atrial pressure of 15 mmHg, the estimated right  ventricular systolic pressure is  28.7 mmHg.   Left Atrium: Left atrial size was mildly dilated.   Right Atrium: Right atrial size was severely dilated.   Pericardium: There is no evidence of pericardial effusion.   Mitral Valve: The mitral valve was not well visualized. There is mild  thickening of the mitral valve leaflet(s). There is mild calcification of  the mitral valve leaflet(s). Mild mitral annular calcification. Mild  mitral valve regurgitation. No evidence  of mitral valve stenosis.   Tricuspid Valve: Severe TR due to RV and anular dilatation, lack of  leaflet coaptation. The tricuspid valve is abnormal. Tricuspid valve  regurgitation is severe. No evidence of tricuspid stenosis.   Aortic Valve: Mild to moderate aortic stenosis. Lower than expected  gradient due to SVI 26, consistent with paradoxical low  flow low gradient  mild to moderate aortic stenosis. The aortic valve has an indeterminant  number of cusps. There is moderate  calcification of the aortic valve. There is moderate thickening of the  aortic valve. There is moderate aortic valve annular calcification. Aortic  valve  regurgitation is not visualized. Mild to moderate aortic stenosis is  present. Aortic valve mean  gradient measures 10.0 mmHg. Aortic valve peak gradient measures 13.7  mmHg. Aortic valve area, by VTI measures 1.32 cm.   Pulmonic Valve: The pulmonic valve was not well visualized. Pulmonic valve  regurgitation is not visualized. No evidence of pulmonic stenosis.   Aorta: The aortic root and ascending aorta are structurally normal, with  no evidence of dilitation.   Venous: The inferior vena cava is dilated in size with less than 50%  respiratory variability, suggesting right atrial pressure of 15 mmHg.   IAS/Shunts: The interatrial septum was not well visualized.  Patient Profile     88 y.o. male hx of essential hypertension, CAD s/p stent placement, chronic atrial fibrillation, CKD stage IV, chronic diastolic CHF and NSTEMI this admit and acute CHF and severe TR.    Assessment & Plan    HFpEF/Chronic RV failure/Severe TR  - admit 07/2021 40 lbs weight gain. Managed with lasix gtt with good response, basleine weight reportedly 200 lbs, during Jan 2023 appt he was 204 lbs. Admit wt 228 lbs  09/12 wt is 230, it was standing wt. - due to worsening resp, not able to stand and weigh -I&O neg 1.18 L, had been on lasix 40 BID but beginning 09/10, increased to 80 BID IV  -BNP 2619 on 09/08 -- BUN/Cr 82/2.79 with baseline Cr 2.3-2.7, trending up w/ diuresis, 94/2.88 today, will hold Lasix as UOP only 600 cc recorded and CXR 09/12 showed ATX and small effusion -- Review w/ MD as he is now requiring HFNC since he vomited into his BiPAP   05/01/2022 Echo: LVE 50-55%, cannot eval WMAs, severe RV enlargement and dysfunction with D shaped septum, PASP 47, severe TR    NSTEMI/CAD - NSTEMI in 2014 with DES to LAD - trop to 1314 without peak in setting of decompensated HF - EKG afib, RBBB, no acute ischemic changes  - not a cath candidate due to CKD GFR 26, advanced age and comorbidities - plan  medical management. Plan 48 hrs heparin which ended 05/03/22  -- Continue ASA 81, atorva 40, lopressor 12.'5mg'$  bid for now though had some pauses based on prior notes. No ACE/ARB given renal dysfunction.  - once heparin stopped, eliquis 2.5 BID started  Permanent afib - off beta blocker due to prior pauses had up to 8 sec pauses in past. We have added back low dose will need to monitor. - eliquis was held on admit in case of invasive procedures, now back on eliquis 2.5 BID --event monitor 10/28/21 with atrial fib, mini HR 48 and max 171, rare PVC NSVT 9 beats. No pauses >3 sec.      CKD IV -baseline Cr 2.3-2.7 - trending up w/ diuresis  Aortic stenosis -- Mild to moderate aortic stenosis. Lower than expected gradient due to  SVI 26 -- this is consistent with paradoxical low flow low gradient mild to moderate  aortic stenosis  Severe TR - function TR due to severely enlarged RV, anular dilatation with lack of coaptation   For questions or updates, please contact [place] Please consult [email or web address] for contact info under  Signed, Rosaria Ferries,  PA-C  05/05/2022, 8:46 AM    Patient seen and examined, note reviewed with the signed Advanced Practice Provider. I personally reviewed laboratory data, imaging studies and relevant notes. I independently examined the patient and formulated the important aspects of the plan. I have personally discussed the plan with the patient and/or family. Comments or changes to the note/plan are indicated below.  Patient seen and examined at his bedside.  With worsening truncal obesity - US abdomen showed mild to moderate ascites.  Reviewed labs - suspect intravascular volume depletion  - will hold lasix today. Continue to hold beta blocker - could be transitioned to oral anticoagulation.   Berniece Salines DO, MS [facility] Attending Cardiologist [place]  [address] [place], [place] [address] [phone] Website:  [email or web address]

## 2022-05-05 NOTE — Progress Notes (Addendum)
PROGRESS NOTE    Lawrence Harper  UXN:[id number] DOB: 1932/05/28 DOA: 04/29/2022 PCP: System, Provider Not In  88/F w/hypertension, CAD, atrial fibrillation, CKD stage IV and chronic diastolic heart failure, chronic RV failure presented with fatigue. Reported 2 weeks of dyspnea, lower extremity edema, increased abdominal girth and orthopnea. In ED, noted to be in Afib -CXR noted cardiomegaly and mild bilateral hilar vascular congestion, placed on IV Lasix, also started on IV heparin for NSTEMI.  9/11 his volume status has been improving.  Oral antibiotic therapy for right leg cellulitis.  09/12 abdominal US with mild to moderate ascites.  -Cardiology following, Limited response to diuretics, ischemic work-up not pursued on account of CKD 4 -Palliative care consulted -Overnight developed more respiratory distress and abdominal distention, placed on BiPAP, increase diuretics given, vomited and likely aspirated, now obtunded, tachypneic, placed on IV meropenem   Subjective: -Worsening respiratory distress last night, vomited into BiPAP, increased rhonchi, with tender distended abdomen -Now obtunded  Assessment and Plan:  Acute on chronic diastolic CHF/HFpEF Pulm HTN, severe RV failure Echo with EF 50 to 25%K RV systolic function with severe reduction, RV severe cavity enlargement, RVSP 46,6 mmHg, RA with severe dilatation, severe TR, mild to moderate aortic stenosis,  -diuresed with IV lasix, limited response to diuretics thus far -On  Lasix 80 Mg twice daily GDMT limited by CKD4 -Now worsening with aspiration pneumonia, respiratory distress, encephalopathy, prognosis is poor, discussed this with family today, he is scheduled to have a family meeting with palliative care this morning  Acute hypoxic respiratory failure Aspiration pneumonia -Started on IV meropenem last night, continue DuoNebs, n.p.o. for now  Acute metabolic encephalopathy -Secondary to above, off BiPAP with  vomiting  Abdominal distention, tense -KUB with increased gastric air -Unstable for CT abdomen at this time -Keep n.p.o.   NSTEMI/CAD Coronary artery disease. -trop peaked at 1314 -Cards following, medical management recommended with CKD4, 88 years old -Continue metoprolol, aspirin and atorvastatin.    Acute kidney injury superimposed on chronic kidney disease ([place]) CKD stage IV. Hyponatremia. -baseline creat 2.3-2.7 -Mild worsening   Chronic anemia, due to chronic renal disease, Iron deficiency -Hemoglobin stable   Essential hypertension Continue with metoprolol, and diuresis with furosemide.    Atrial fibrillation (HCC) -previously off B blocker w/long pauses, back on low dose now -continue eliquis   COPD (chronic obstructive pulmonary disease) (HCC) Suspected COPD -Continue current bronchodilators    Rt Leg Cellulitis  Sepsis ruled out.  -component of vascular insufficiency combined with skin/STI , Rx w/  oral cephalexin for 8 days.     Hypoalbuminemia due to protein-calorie malnutrition (HCC) -Supplements as tolerated   Obesity (BMI 30-39.9) Calculated BMI is 37,9       DVT prophylaxis: Code Status:  Family Communication: Disposition Plan:   Consultants:    Procedures:   Antimicrobials:    Objective: Vitals:   05/05/22 0314 05/05/22 0612 05/05/22 0827 05/05/22 1420  BP: 136/78     Pulse: (!) 114  (!) 110 92  Resp:   18 (!) 24  Temp:      TempSrc:      SpO2: 100% 96% 97% 99%  Weight:      Height:        Intake/Output Summary (Last 24 hours) at 05/05/2022 1526 Last data filed at 05/04/2022 1800 Gross per 24 hour  Intake --  Output 600 ml  Net -600 ml   Filed Weights   05/02/22 0437 05/03/22 0657 05/04/22 0500  Weight: 103.4 kg  104.3 kg 104.3 kg    Examination:  General exam: Obtunded, poorly responsive, opens eyes, oriented to self HEENT: Positive JVD CVS: S1-S2, regular rhythm Lungs: Diffuse scattered rhonchi  bilaterally Abdomen: Obese, distended, tense, increased bowel sounds Extremities: 1-2+ edema Psychiatry: Unable to assess    Data Reviewed:   CBC: Recent Labs  Lab 05/01/22 0525 05/02/22 0916 05/03/22 0322 05/04/22 2144 05/05/22 0435  WBC 14.7* 10.3 9.0 8.4 14.1*  NEUTROABS  --   --   --  7.0  --   HGB 10.8* 10.3* 9.9* 10.5* 10.7*  HCT 34.7* 32.0* 32.1* 33.7* 35.5*  MCV 98.6 97.6 97.0 97.7 99.4  PLT 209 206 198 227 740   Basic Metabolic Panel: Recent Labs  Lab 04/23/2022 2352 05/02/22 0916 05/03/22 0322 05/04/22 0419 05/04/22 2144 05/05/22 0435  NA  --  135 132* 133* 135 135  K  --  4.5 4.4 4.1 4.2 4.3  CL  --  101 101 99 95* 98  CO2  --  26 21* '25 25 25  '$ GLUCOSE  --  120* 103* 111* 163* 108*  BUN  --  66* 75* 82* 92* 94*  CREATININE  --  2.60* 2.63* 2.79* 2.85* 2.88*  CALCIUM  --  8.5* 8.6* 8.7* 8.9 8.7*  MG 2.5*  --  2.3 2.3 2.4  --   PHOS  --   --   --   --  5.1*  --    GFR: Estimated Creatinine Clearance: 20.1 mL/min (A) (by C-G formula based on SCr of 2.88 mg/dL (H)). Liver Function Tests: Recent Labs  Lab 05/01/2022 1825 05/04/22 2144  AST 22 59*  ALT 13 33  ALKPHOS 135* 173*  BILITOT 1.5* 1.0  PROT 8.5* 8.0  ALBUMIN 3.2* 2.6*   No results for input(s): "LIPASE", "AMYLASE" in the last 168 hours. No results for input(s): "AMMONIA" in the last 168 hours. Coagulation Profile: No results for input(s): "INR", "PROTIME" in the last 168 hours. Cardiac Enzymes: No results for input(s): "CKTOTAL", "CKMB", "CKMBINDEX", "TROPONINI" in the last 168 hours. BNP (last 3 results) No results for input(s): "PROBNP" in the last 8760 hours. HbA1C: No results for input(s): "HGBA1C" in the last 72 hours. CBG: Recent Labs  Lab 05/02/22 1130 05/02/22 1726 05/02/22 2056 05/03/22 0748 05/03/22 1239  GLUCAP 106* 111* 120* 92 128*   Lipid Profile: No results for input(s): "CHOL", "HDL", "LDLCALC", "TRIG", "CHOLHDL", "LDLDIRECT" in the last 72 hours. Thyroid  Function Tests: No results for input(s): "TSH", "T4TOTAL", "FREET4", "T3FREE", "THYROIDAB" in the last 72 hours. Anemia Panel: No results for input(s): "VITAMINB12", "FOLATE", "FERRITIN", "TIBC", "IRON", "RETICCTPCT" in the last 72 hours. Urine analysis:    Component Value Date/Time   COLORURINE YELLOW 04/29/2022 1751   APPEARANCEUR CLEAR 05/21/2022 1751   LABSPEC 1.013 05/13/2022 1751   PHURINE 7.0 04/29/2022 1751   GLUCOSEU NEGATIVE 05/18/2022 1751   HGBUR MODERATE (A) 05/01/2022 1751   BILIRUBINUR NEGATIVE 05/06/2022 1751   KETONESUR NEGATIVE 05/21/2022 1751   PROTEINUR 30 (A) 05/09/2022 1751   NITRITE NEGATIVE 04/24/2022 1751   LEUKOCYTESUR NEGATIVE 05/03/2022 1751   Sepsis Labs: '@LABRCNTIP'$ (procalcitonin:4,lacticidven:4)  ) Recent Results (from the past 240 hour(s))  SARS Coronavirus 2 by RT PCR (hospital order, performed in [facility] hospital lab) *cepheid single result test* Anterior Nasal Swab     Status: None   Collection Time: 05/01/22  1:49 AM   Specimen: Anterior Nasal Swab  Result Value Ref Range Status   SARS Coronavirus 2 by RT  PCR NEGATIVE NEGATIVE Final    Comment: (NOTE) SARS-CoV-2 target nucleic acids are NOT DETECTED.  The SARS-CoV-2 RNA is generally detectable in upper and lower respiratory specimens during the acute phase of infection. The lowest concentration of SARS-CoV-2 viral copies this assay can detect is 250 copies / mL. A negative result does not preclude SARS-CoV-2 infection and should not be used as the sole basis for treatment or other patient management decisions.  A negative result may occur with improper specimen collection / handling, submission of specimen other than nasopharyngeal swab, presence of viral mutation(s) within the areas targeted by this assay, and inadequate number of viral copies (<250 copies / mL). A negative result must be combined with clinical observations, patient history, and epidemiological information.  Fact  Sheet for Patients:   https://www.patel.info/  Fact Sheet for Healthcare Providers: https://hall.com/  This test is not yet approved or  cleared by the [place] FDA and has been authorized for detection and/or diagnosis of SARS-CoV-2 by FDA under an Emergency Use Authorization (EUA).  This EUA will remain in effect (meaning this test can be used) for the duration of the COVID-19 declaration under Section 564(b)(1) of the Act, 21 U.S.C. section 360bbb-3(b)(1), unless the authorization is terminated or revoked sooner.  Performed at [facility], [address] [place], [place] [address]   MRSA Next Gen by PCR, Nasal     Status: None   Collection Time: 05/03/22  1:22 AM   Specimen: Nasal Mucosa; Nasal Swab  Result Value Ref Range Status   MRSA by PCR Next Gen NOT DETECTED NOT DETECTED Final    Comment: (NOTE) The GeneXpert MRSA Assay (FDA approved for NASAL specimens only), is one component of a comprehensive MRSA colonization surveillance program. It is not intended to diagnose MRSA infection nor to guide or monitor treatment for MRSA infections. Test performance is not FDA approved in patients less than 63 years old. Performed at [facility], [place] [address], [place], [place] [address]   Culture, blood (Routine X 2) w Reflex to ID Panel     Status: None (Preliminary result)   Collection Time: 05/03/22  3:22 AM   Specimen: BLOOD  Result Value Ref Range Status   Specimen Description BLOOD LEFT FOREARM  Final   Special Requests   Final    BOTTLES DRAWN AEROBIC AND ANAEROBIC Blood Culture adequate volume   Culture   Final    NO GROWTH 2 DAYS Performed at [facility], [place] [address] [place], [place] [address]    Report Status PENDING  Incomplete  Culture, blood (Routine X 2) w Reflex to ID Panel     Status: None (Preliminary result)   Collection Time: 05/03/22  3:31 AM   Specimen: BLOOD  Result Value Ref Range Status    Specimen Description BLOOD LEFT WRIST  Final   Special Requests   Final    BOTTLES DRAWN AEROBIC AND ANAEROBIC Blood Culture adequate volume   Culture   Final    NO GROWTH 2 DAYS Performed at [facility], [place] [address] [place], [place] [address]    Report Status PENDING  Incomplete     Radiology Studies: DG Abd Portable 1V  Result Date: 05/04/2022 CLINICAL DATA:  Abdominal distension, vomiting EXAM: PORTABLE ABDOMEN - 1 VIEW COMPARISON:  None Available. FINDINGS: There is gaseous distention of the stomach. Large and small bowel decompressed. No visible organomegaly or free air. Prior cholecystectomy. IMPRESSION: Gaseous distention of the stomach.  Electronically Signed   By: Rolm Baptise M.D.   On: 05/04/2022 22:10   DG CHEST PORT 1 VIEW  Result Date: 05/04/2022 CLINICAL DATA:  Aspiration. EXAM: PORTABLE CHEST 1 VIEW COMPARISON:  Chest x-ray 05/04/2022 FINDINGS: The heart is enlarged, unchanged. There are left basilar opacities with likely small left pleural effusion. There is no pneumothorax or acute fracture. IMPRESSION: 1. Left basilar atelectasis/airspace disease with likely small left pleural effusion. 2. Stable cardiomegaly. Electronically Signed   By: Ronney Asters M.D.   On: 05/04/2022 22:09   DG Chest 1 View  Result Date: 05/04/2022 CLINICAL DATA:  Dyspnea. EXAM: CHEST  1 VIEW COMPARISON:  Chest x-ray 08/10/2021 FINDINGS: Heart is enlarged. There is a stable small left pleural effusion. Left basilar infiltrates can not be excluded. No evidence for pneumothorax or acute fracture. IMPRESSION: 1. Stable small left pleural effusion. Can not exclude left basilar atelectasis/airspace disease. 2. Stable cardiomegaly. Electronically Signed   By: Ronney Asters M.D.   On: 05/04/2022 19:14   US Abdomen Limited  Result Date: 05/04/2022 CLINICAL DATA:  Ascites. EXAM: LIMITED ABDOMEN ULTRASOUND FOR ASCITES TECHNIQUE: Limited ultrasound survey for ascites was performed in all four  abdominal quadrants. COMPARISON:  None Available. FINDINGS: Mild to moderate amount of ascites is noted in the right lower, left upper and left lower quadrants. No definite ascites is noted in the right upper quadrant. IMPRESSION: Mild to moderate ascites is noted as described above. Electronically Signed   By: Marijo Conception M.D.   On: 05/04/2022 10:53     Scheduled Meds:  glycopyrrolate  0.4 mg Intravenous TID   leptospermum manuka honey  1 Application Topical Daily   mometasone-formoterol  2 puff Inhalation BID   pantoprazole (PROTONIX) IV  40 mg Intravenous Q24H   timolol  1 drop Both Eyes BID   Continuous Infusions:  sodium chloride 10 mL/hr at 05/04/22 2255     LOS: 4 days    Time spent: 91mn  PDomenic Polite MD [facility]   05/05/2022, 3:26 PM

## 2022-05-05 NOTE — Care Management Important Message (Signed)
Important Message  Patient Details  Name: LESHON ARMISTEAD MRN: [id number] Date of Birth: 09/15/1931   Medicare Important Message Given:  Yes     Shelda Altes 05/05/2022, 9:37 AM

## 2022-05-05 NOTE — Progress Notes (Signed)
Received a call from bedside RN regarding the patient vomiting.  BiPAP was promptly discontinued and the patient was made NPO.  Received as needed IV antiemetics.  Presented at bedside.  Audible rhonchorous sounds noted.  His abdomen is obese, severely distended and tender with palpation at epigastric region.  IV Protonix 40 mg daily ordered.  The patient had a ultrasound abdomen done earlier which showed moderate ascites.  Chest x-ray and abdominal x-ray were obtained.  Due to concern for developing aspiration pneumonia and acute decompensation, the patient was started on meropenem and labs were repeated.  Updated the patient's daughter at bedside.  We will continue to closely monitor and treat as indicated.

## 2022-05-05 NOTE — Consult Note (Signed)
Consultation Note Date: 05/05/2022   Patient Name: Lawrence Harper  DOB: Dec 01, 1931  MRN: [id number]  Age / Sex: 88 y.o., male  PCP: System, Provider Not In Referring Physician: Domenic Polite, MD  Reason for Consultation: Establishing goals of care  HPI/Patient Profile: 88 y.o. male  with past medical history of hypertension, CAD s/p stent placement, chronic atrial fibrillation, CKD stage IV, HFpEF admitted on 04/23/2022 with fatigue, shortness of breath, leg swelling with RLE weeping related to heart failure exacerbation. Acute worsening of condition 9/12. Now on HFNC after noting that he vomited into his BiPAP. [place] abd with mild to mod ascites.   Clinical Assessment and Goals of Care: I have reviewed records and discussed with RN. I met at [facility] bedside but stepped out to speak with his children, Lawrence Harper and Lawrence Harper, privately at first. We discussed Lawrence Harper's disease progression with heart failure and kidney failure. We discussed his symptoms and suffering. Family is clear that they do not want him to suffer. Lawrence Harper voices that she also knows that her father would not want to be sedated either as he has such a vibrant and outgoing personality. I discussed with them the risks vs benefits of comfort medications with knowledge that this can cause sedation but I would also expect him to become more sleepy and sedated as he progresses at end of life. I explained to family that I worry his prognosis is very short and I worry about signs I am seeing that anything could happen at anytime. I recommended that comfort focused care is the best way we can care for him at this stage of his illness. Family voices concern that Lawrence Harper does not understand the severity of his condition and that they are worried he will "give up" if he does know but they also feel it is important to be honest with him. I suggested that he likely does know his prognosis  is poor and although he may have not voiced this directly to his family.   I met back at Lawrence Harper's bedside with his wife and son Lawrence Harper. Lawrence Harper is sleepy but arouses to nod his head and say a few words on occasion but with much effort. Very poor reserve. I was able to express to Lawrence Harper the severity of his illness and poor prognosis - he nods in understanding and does not seem surprised by this information. I explained to Lawrence Harper that the best way we can care for him is to treat his symptoms to allow him comfort and rest and minimize his suffering and he agrees this is what he wants. Everyone understands that his prognosis is poor.   All questions/concerns addressed. Emotional support provided to patient and family at bedside. Discussed with RN, Dr. Broadus John, and TOC.   Primary Decision Maker NEXT OF KIN wife and adult children    SUMMARY OF RECOMMENDATIONS   - DNR - Comfort focused care - No plans to escalate care - Continue current oxygen at this time  Code Status/Advance Care Planning: DNR   Symptom  Management:  PRN medications provided to ensure comfort. May increase dosage/frequency as needed to achieve comfort.   Palliative Prophylaxis:  Frequent Pain Assessment and Oral Care  Additional Recommendations (Limitations, Scope, Preferences): Full Comfort Care  Psycho-social/Spiritual:  Desire for further Chaplaincy support:no Additional Recommendations: Grief/Bereavement Support  Prognosis:  Hours - Days  Discharge Planning: Anticipated Hospital Death      Primary Diagnoses: Present on Admission:  Acute on chronic diastolic CHF/HFpEF  Atrial fibrillation (HCC)  Acute kidney injury superimposed on chronic kidney disease (HCC)  Hypoalbuminemia due to protein-calorie malnutrition (HCC)  Essential hypertension  NSTEMI (non-ST elevated myocardial infarction)   Sepsis due to Rt Leg Cellulitis    I have reviewed the medical record, interviewed the patient and family, and examined the  patient. The following aspects are pertinent.  Past Medical History:  Diagnosis Date   (HFpEF) heart failure with preserved ejection fraction ([place])    Echocardiogram 03/16/16 [facility] in [place]): EF 46, normal RVSF, mod to severe TR, mild AS (mean 10 mmHg), trace AI, mild PI, mild MR, small secundum ASD w L-R shunt, RVSP 40   Arthritis    Atrial fibrillation (HCC)    Cancer (HCC)    Chronic kidney disease    Coronary artery disease    NSTEMI 9/14 >> Cath: LM ok, pLAD 50, mLAD 95, pD1 80; LCx/RI/OM1/OM2/LPDA/RCA patent >> PCI: 2.5 x 24 mm Promus DES to mLAD   Diabetes mellitus without complication (HCC)    Diverticulitis    GERD (gastroesophageal reflux disease)    High cholesterol    History of non-ST elevation myocardial infarction in 2014 s/p DES to LAD    Hypertension    Peripheral vascular disease ([place])    Social History   Socioeconomic History   Marital status: Married    Spouse name: Not on file   Number of children: Not on file   Years of education: Not on file   Highest education Harper: Not on file  Occupational History   Not on file  Tobacco Use   Smoking status: Never   Smokeless tobacco: Never  Vaping Use   Vaping Use: Never used  Substance and Sexual Activity   Alcohol use: No   Drug use: No   Sexual activity: Not on file  Other Topics Concern   Not on file  Social History Narrative   Not on file   Social Determinants of Health   Financial Resource Strain: Not on file  Food Insecurity: No Food Insecurity (05/01/2022)   Hunger Vital Sign    Worried About Running Out of Food in the Last Year: Never true    Ran Out of Food in the Last Year: Never true  Transportation Needs: No Transportation Needs (05/01/2022)   PRAPARE - Hydrologist (Medical): No    Lack of Transportation (Non-Medical): No  Physical Activity: Not on file  Stress: Not on file  Social Connections: Not on file   Family History  Problem Relation Age of  Onset   Throat cancer Mother 21   Scheduled Meds:  apixaban  2.5 mg Oral BID   aspirin EC  81 mg Oral Q breakfast   atorvastatin  40 mg Oral Daily   calcium-vitamin D  1 tablet Oral Q breakfast   feeding supplement  237 mL Oral BID BM   ferrous sulfate  325 mg Oral Q breakfast   folic acid  1 mg Oral Daily   ipratropium-albuterol  3 mL Nebulization  Q6H   ipratropium-albuterol  3 mL Nebulization STAT   leptospermum manuka honey  1 Application Topical Daily   metoprolol tartrate  12.5 mg Oral BID   mometasone-formoterol  2 puff Inhalation BID   pantoprazole (PROTONIX) IV  40 mg Intravenous Q24H   senna-docusate  2 tablet Oral QHS   timolol  1 drop Both Eyes BID   Continuous Infusions:  sodium chloride 10 mL/hr at 05/04/22 2255   meropenem (MERREM) IV 1 g (05/05/22 0841)   PRN Meds:.sodium chloride, acetaminophen, guaiFENesin-dextromethorphan, ipratropium-albuterol, melatonin, ondansetron (ZOFRAN) IV, oxyCODONE, polyethylene glycol, prochlorperazine No Known Allergies Review of Systems  Respiratory:  Positive for shortness of breath.     Physical Exam Vitals and nursing note reviewed.  Constitutional:      Appearance: He is ill-appearing.     Comments: Lips cyanotic  Cardiovascular:     Rate and Rhythm: Tachycardia present.  Pulmonary:     Comments: Agonal shallow guppy breathes but regular; no apnea noted Abdominal:     General: There is distension.  Neurological:     Mental Status: He is easily aroused.     Comments: Able to nod appropriately most of the time; poor reserve to respond     Vital Signs: BP 136/78   Pulse (!) 110   Temp 98.9 F (37.2 C) (Oral)   Resp 18   Ht _0  (1.651 m)   Wt 104.3 kg   SpO2 97%   BMI 38.27 kg/m  Pain Scale: 0-10 POSS *See Group Information*: S-Acceptable,Sleep, easy to arouse Pain Score: 0-No pain   SpO2: SpO2: 97 % O2 Device:SpO2: 97 % O2 Flow Rate: .O2 Flow Rate (L/min): (S) 25 L/min  IO: Intake/output summary:   Intake/Output Summary (Last 24 hours) at 05/05/2022 1138 Last data filed at 05/04/2022 1800 Gross per 24 hour  Intake 480 ml  Output 600 ml  Net -120 ml    LBM: Last BM Date : 05/03/22 Baseline Weight: Weight: 97.1 kg Most recent weight: Weight: 104.3 kg     Palliative Assessment/Data:     Time In: 1315  Time Total: 60 min  Greater than 50%  of this time was spent counseling and coordinating care related to the above assessment and plan.  Signed by: Vinie Sill, NP Palliative Medicine Team Pager # [phone] (M-F 8a-5p) Team Phone # [phone] (Nights/Weekends)

## 2022-05-06 DIAGNOSIS — Z515 Encounter for palliative care: Secondary | ICD-10-CM | POA: Diagnosis not present

## 2022-05-06 DIAGNOSIS — I5033 Acute on chronic diastolic (congestive) heart failure: Secondary | ICD-10-CM | POA: Diagnosis not present

## 2022-05-06 MED ORDER — GLYCOPYRROLATE 0.2 MG/ML IJ SOLN: 0.4000 mg | INTRAMUSCULAR | Status: DC | PRN

## 2022-05-06 MED ORDER — GLYCOPYRROLATE 0.2 MG/ML IJ SOLN
0.4000 mg | INTRAMUSCULAR | Status: DC | PRN
  Administered 2022-05-06 – 2022-05-07 (×4): 0.4 mg via INTRAVENOUS
  Filled 2022-05-06 (×4): qty 2

## 2022-05-06 NOTE — Progress Notes (Addendum)
Rounding Note    Patient Name: Lawrence Harper Date of Encounter: 05/06/2022  [place] Cardiologist: Carlyle Dolly, MD   Subjective   Family is at bedside, states patient is getting worse, primary team had involved palliative care team, patient was transitioned to comfort care measures yesterday. He is comfortable and calm at this time, able to make some communication with his family.   Inpatient Medications    Scheduled Meds:  glycopyrrolate  0.4 mg Intravenous TID   leptospermum manuka honey  1 Application Topical Daily   mometasone-formoterol  2 puff Inhalation BID   pantoprazole (PROTONIX) IV  40 mg Intravenous Q24H   timolol  1 drop Both Eyes BID   Continuous Infusions:  sodium chloride Stopped (05/05/22 0841)   PRN Meds: sodium chloride, acetaminophen **OR** acetaminophen, antiseptic oral rinse, bisacodyl, haloperidol **OR** [DISCONTINUED] haloperidol **OR** haloperidol lactate, HYDROmorphone (DILAUDID) injection, ipratropium-albuterol, LORazepam, ondansetron (ZOFRAN) IV, polyvinyl alcohol, prochlorperazine   Vital Signs    Vitals:   05/05/22 1420 05/05/22 1525 05/05/22 1953 05/06/22 0807  BP:  112/80 112/80   Pulse: 92 86 88   Resp: (!) '24 19 20 '$ (!) 22  Temp:  98 F (36.7 C)    TempSrc:  Oral    SpO2: 99% 94% 96%   Weight:      Height:        Intake/Output Summary (Last 24 hours) at 05/06/2022 0822 Last data filed at 05/05/2022 1812 Gross per 24 hour  Intake 21.2 ml  Output --  Net 21.2 ml      05/04/2022    5:00 AM 05/03/2022    6:57 AM 05/02/2022    4:37 AM  Last 3 Weights  Weight (lbs) 230 lb 230 lb 227 lb 15.3 oz  Weight (kg) 104.327 kg 104.327 kg 103.4 kg      Telemetry    A fib 100s  - Personally Reviewed  ECG    N/A - Personally Reviewed  Physical Exam   GEN: No acute distress.   Neck: + JVD Cardiac:  irreg irreg, 2/6 murmurs, rubs, or gallops. Respiratory: crackles bilaterally, on [place] oxygen  GI: Soft, nontender,  non-distended  MS: 1+ BLE edema  Neuro:  open eyes to voice, sleepy, able to nod for questions   Psych: Normal affect   Labs    High Sensitivity Troponin:   Recent Labs  Lab 04/29/2022 2352 05/01/22 0136 05/01/22 0525 05/01/22 0723 05/01/22 1652  TROPONINIHS 31* 486* 920* 1,141* 1,314*     Chemistry Recent Labs  Lab 04/26/2022 1825 05/13/2022 2352 05/03/22 0322 05/04/22 0419 05/04/22 2144 05/05/22 0435  NA  --    < > 132* 133* 135 135  K  --    < > 4.4 4.1 4.2 4.3  CL  --    < > 101 99 95* 98  CO2  --    < > 21* '25 25 25  '$ GLUCOSE  --    < > 103* 111* 163* 108*  BUN  --    < > 75* 82* 92* 94*  CREATININE  --    < > 2.63* 2.79* 2.85* 2.88*  CALCIUM  --    < > 8.6* 8.7* 8.9 8.7*  MG  --    < > 2.3 2.3 2.4  --   PROT 8.5*  --   --   --  8.0  --   ALBUMIN 3.2*  --   --   --  2.6*  --  AST 22  --   --   --  59*  --   ALT 13  --   --   --  33  --   ALKPHOS 135*  --   --   --  173*  --   BILITOT 1.5*  --   --   --  1.0  --   GFRNONAA  --    < > 23* 21* 21* 20*  ANIONGAP  --    < > '10 9 15 12   '$ < > = values in this interval not displayed.    Lipids No results for input(s): "CHOL", "TRIG", "HDL", "LABVLDL", "LDLCALC", "CHOLHDL" in the last 168 hours.  Hematology Recent Labs  Lab 05/03/22 0322 05/04/22 2144 05/05/22 0435  WBC 9.0 8.4 14.1*  RBC 3.31* 3.45* 3.57*  HGB 9.9* 10.5* 10.7*  HCT 32.1* 33.7* 35.5*  MCV 97.0 97.7 99.4  MCH 29.9 30.4 30.0  MCHC 30.8 31.2 30.1  RDW 18.9* 18.6* 18.8*  PLT 198 227 234   Thyroid No results for input(s): "TSH", "FREET4" in the last 168 hours.  BNP Recent Labs  Lab 05/08/2022 2352  BNP 2,619.0*    DDimer No results for input(s): "DDIMER" in the last 168 hours.   Radiology    DG Abd Portable 1V  Result Date: 05/04/2022 CLINICAL DATA:  Abdominal distension, vomiting EXAM: PORTABLE ABDOMEN - 1 VIEW COMPARISON:  None Available. FINDINGS: There is gaseous distention of the stomach. Large and small bowel decompressed. No visible  organomegaly or free air. Prior cholecystectomy. IMPRESSION: Gaseous distention of the stomach. Electronically Signed   By: Rolm Baptise M.D.   On: 05/04/2022 22:10   DG CHEST PORT 1 VIEW  Result Date: 05/04/2022 CLINICAL DATA:  Aspiration. EXAM: PORTABLE CHEST 1 VIEW COMPARISON:  Chest x-ray 05/04/2022 FINDINGS: The heart is enlarged, unchanged. There are left basilar opacities with likely small left pleural effusion. There is no pneumothorax or acute fracture. IMPRESSION: 1. Left basilar atelectasis/airspace disease with likely small left pleural effusion. 2. Stable cardiomegaly. Electronically Signed   By: Ronney Asters M.D.   On: 05/04/2022 22:09   DG Chest 1 View  Result Date: 05/04/2022 CLINICAL DATA:  Dyspnea. EXAM: CHEST  1 VIEW COMPARISON:  Chest x-ray 08/10/2021 FINDINGS: Heart is enlarged. There is a stable small left pleural effusion. Left basilar infiltrates can not be excluded. No evidence for pneumothorax or acute fracture. IMPRESSION: 1. Stable small left pleural effusion. Can not exclude left basilar atelectasis/airspace disease. 2. Stable cardiomegaly. Electronically Signed   By: Ronney Asters M.D.   On: 05/04/2022 19:14   US Abdomen Limited  Result Date: 05/04/2022 CLINICAL DATA:  Ascites. EXAM: LIMITED ABDOMEN ULTRASOUND FOR ASCITES TECHNIQUE: Limited ultrasound survey for ascites was performed in all four abdominal quadrants. COMPARISON:  None Available. FINDINGS: Mild to moderate amount of ascites is noted in the right lower, left upper and left lower quadrants. No definite ascites is noted in the right upper quadrant. IMPRESSION: Mild to moderate ascites is noted as described above. Electronically Signed   By: Marijo Conception M.D.   On: 05/04/2022 10:53    Cardiac Studies   Echo from 05/01/22:   1. Left ventricular ejection fraction, by estimation, is 50 to 55%. The  left ventricle has low normal function. Left ventricular endocardial  border not optimally defined to evaluate  regional wall motion. Left  ventricular diastolic parameters are  indeterminate.   2. Right ventricular systolic function is severely reduced. The  right  ventricular size is severely enlarged. There is moderately elevated  pulmonary artery systolic pressure.   3. Left atrial size was mildly dilated.   4. Right atrial size was severely dilated.   5. The mitral valve was not well visualized. Mild mitral valve  regurgitation. No evidence of mitral stenosis.   6. Severe TR due to RV and anular dilatation, lack of leaflet coaptation.  . The tricuspid valve is abnormal. Tricuspid valve regurgitation is  severe.   7. Mild to moderate aortic stenosis. Lower than expected gradient due to  SVI 26, consistent with paradoxical low flow low gradient mild to moderate  aortic stenosis. . The aortic valve has an indeterminant number of cusps.  There is moderate calcification   of the aortic valve. There is moderate thickening of the aortic valve.  Aortic valve regurgitation is not visualized. Mild to moderate aortic  valve stenosis. Aortic valve mean gradient measures 10.0 mmHg. Aortic  valve peak gradient measures 13.7 mmHg.  Aortic valve area, by VTI measures 1.32 cm. DI 0.42   8. The inferior vena cava is dilated in size with <50% respiratory  variability, suggesting right atrial pressure of 15 mmHg.    Echo from 08/11/21:   1. Left ventricular ejection fraction, by estimation, is 65 to 70%. The  left ventricle has normal function. The left ventricle has no regional  wall motion abnormalities. There is mild left ventricular hypertrophy.  Left ventricular diastolic parameters  are indeterminate.   2. Right ventricular systolic function is severely reduced. The right  ventricular size is moderately enlarged. There is mildly elevated  pulmonary artery systolic pressure.   3. Left atrial size was mildly dilated.   4. Right atrial size was severely dilated.   5. Trivial mitral valve  regurgitation.   6. Tricuspid valve regurgitation is severe.   7. AV is thickened, calcified. Peak and mean gradients throught the valve  are 33 and 17 mm Hg respectviely AVA (VTI) is 0.84 cm 2. Dimensionless  index is 0.31 consistent with moderate AS. Marland Kitchen The aortic valve is  tricuspid. Aortic valve regurgitation is  not visualized.   8. The inferior vena cava is dilated in size with <50% respiratory  variability, suggesting right atrial pressure of 15 mmHg.    Patient Profile     88 y.o. male with PMH of CAD s/p DES to LAD 2014, permanent atrial fibrillation, HFpEF, ASD, hypertension, hyperlipidemia, type 2 diabetes mellitus, chronic kidney disease, GERD, PAD, glaucoma, cardiology is following for acute on chronic HFpEF/RV failure.   Assessment & Plan    Acute on chronic HFpEF/Chronic RV failure/Severe TR/mod AS - baseline weight 200 ibs, admitted with 228 ibs - BNP 2619, POA - CXR showed cardiomegaly with small left effusion and vascular calcification, POA  - Echo 05/01/22 with LVEF 50-55%, severely reduce RV, moderate elevated PASP 46.59mHg, mild LAE, severe RAE, mild MR, severe TR, mild to mod AS with mean gradient measures 10.0 mmHg. Aortic  valve peak gradient measures 13.7 mmHg. Aortic valve area, by VTI measures 1.32 cm. DI 0.42 .  - I&O not accurate, weight 214>230ibs - Has been diuresed with IV Lasix '40mg'$  BID >'80mg'$  BID, due to concern of intravascular depletion with rising Cr and low UOP, lasix held yesterday - now transitioned to comfort measures per family wish, may use PRN lasix for congestion and PRN dilaudid and ativan for dyspnea   NSTEMI CAD s/p DES to LAD in 2014  - Hs trop 31>486>920>1141>1314 - EKG  without acute ischemic changes  - not a cath candidate due to advanced CKD  - recommend medical therapy: completed 48 hrs heparin gtt,  may stop ASA '81mg'$ , atorva '40mg'$ , lopressor 12.'5mg'$  BID given comfort measures now   Permanent afib - off beta blocker due to prior  pauses had up to 8 sec pauses in past. Low dose lopressor added back this admission, seems tolerating, can add back if RVR causing discomfort  - stop eliquis   CKD IV -baseline Cr 2.3-2.7 -Cr up trending with diuresis, likely will worsen with CHF   For questions or updates, please contact [place] Please consult [email or web address] for contact info under  Signed, Margie Billet, NP  05/06/2022, 8:22 AM    Patient seen and examined, note reviewed with the signed Advanced Practice Provider. I personally reviewed laboratory data, imaging studies and relevant notes. I independently examined the patient and formulated the important aspects of the plan. I have personally discussed the plan with the patient and/or family. Comments or changes to the note/plan are indicated below.  Patient seen and examined at his bedside. He is family members were at the bedside.  He is fully comfort measure with nothing by mouth.   [place] will sign off.   Medication Recommendations:  all po meds has been stopped Other recommendations (labs, testing, etc):  none Follow up as an outpatient:  none needed   Berniece Salines DO, MS [facility] Attending Cardiologist [place]  [address] [place], [place] [address] [phone] Website: [email or web address]

## 2022-05-06 NOTE — Progress Notes (Signed)
Heart Failure Navigator Progress Note  Assessed for Heart & Vascular TOC clinic readiness.  Patient does not meet criteria due to comfort care measures started on 05/05/22.    Earnestine Leys, BSN, Clinical cytogeneticist Only

## 2022-05-06 NOTE — Progress Notes (Signed)
Patient seen and examined, daughter at bedside -Now  comfort care, resting comfortably, getting as needed Dilaudid and Robinul -Anticipate hospital demise  Domenic Polite, MD

## 2022-05-06 NOTE — Progress Notes (Addendum)
Patient Lawrence Harper      DOB: 27-Aug-1931      XAJ:[id number]      Palliative Medicine Team    Subjective: Bedside symptom check completed. Multiple family members bedside at time of visit.    Physical exam: Patient resting in bed with eyes closed at time of visit. Breathing non-labored with some apnea noted on nasal cannula, no excessive secretions noted. Patient without physical or non-verbal signs of pain or discomfort at this time. Color change noted in face and extremities, skin warm to touch, unable to palpate distal pulses this morning. Patient flutters eyes to gentle tactile stimulation, does not truly acknowledge this RN bedside. Family endorses that patient is more interactive when pain medication wears off some, they are appreciative of moments for communication with him but very understanding of the fine balance between comfort and symptomatic.    Assessment and plan: Family with a few questions regarding comfort medications this morning, all answered. Plan to continue with current agents and assess any changes throughout day. Encouraged family to call PMT phone with any concerns. Patient with advancement of EOL symptoms compared to assessment yesterday, anticipate in house passing in hours to days. Bedside RN without needs or concerns at this time. Family appreciative of care received on 6E. Will continue to follow for any changes or advances.    Thank you for allowing the Palliative Medicine Team to assist in the care of this patient.     Damian Leavell, MSN, RN Palliative Medicine Team Team Phone: [phone]  This phone is monitored 7a-7p, please reach out to attending physician outside of these hours for urgent needs.

## 2022-05-06 NOTE — Progress Notes (Signed)
Palliative:  HPI: 88 y.o. male  with past medical history of hypertension, CAD s/p stent placement, chronic atrial fibrillation, CKD stage IV, HFpEF admitted on 05/04/2022 with fatigue, shortness of breath, leg swelling with RLE weeping related to heart failure exacerbation. Acute worsening of condition 9/12. Now on HFNC after noting that he vomited into his BiPAP. [place] abd with mild to mod ascites.    I received call from daughter, [name], who had some questions and concerns which I addressed to the best of my ability. Requested palliative RN Hildred Alamin to check in on needs and comfort until I can visit later at bedside.   I came to bedside later today. Mr. Labrosse is sleeping comfortably. Wife and son at bedside. Son confirms that their questions are now clarified. Plan to continue goals for comfort care and anticipating hospital death. Family are holding vigil at bedside. Family shared fond stories and pictures of Lawrence Harper. Discussed comfort plan and signs/symptoms at end of life.   All questions/concerns addressed. Emotional support provided.   Exam: Sleeping comfortably. Breathing with some apneic episodes and very shallow. Extremities cool to touch.   Plan: - DNR, comfort care - Anticipate hospital death - Continue comfort medications; increase dosage/frequency as needed to achieve comfort and symptom relief  35 min  Vinie Sill, NP Palliative Medicine Team Pager [phone] (Please see [email or web address] for schedule) Team Phone [phone]    Greater than 50%  of this time was spent counseling and coordinating care related to the above assessment and plan

## 2022-05-07 LAB — BASIC METABOLIC PANEL
Anion gap: 15 (ref 5–15)
BUN: 133 mg/dL — ABNORMAL HIGH (ref 8–23)
CO2: 22 mmol/L (ref 22–32)
Calcium: 8.8 mg/dL — ABNORMAL LOW (ref 8.9–10.3)
Chloride: 99 mmol/L (ref 98–111)
Creatinine, Ser: 4.77 mg/dL — ABNORMAL HIGH (ref 0.61–1.24)
GFR, Estimated: 11 mL/min — ABNORMAL LOW (ref >60)
Glucose, Bld: 33 mg/dL — CL (ref 70–99)
Potassium: >7.5 mmol/L (ref 3.5–5.1)
Sodium: 136 mmol/L (ref 135–145)

## 2022-05-07 LAB — CBC
HCT: 46.7 % (ref 39.0–52.0)
Hemoglobin: 13.2 g/dL (ref 13.0–17.0)
MCH: 30.7 pg (ref 26.0–34.0)
MCHC: 28.3 g/dL — ABNORMAL LOW (ref 30.0–36.0)
MCV: 108.6 fL — ABNORMAL HIGH (ref 80.0–100.0)
Platelets: 194 10*3/uL (ref 150–400)
RBC: 4.3 MIL/uL (ref 4.22–5.81)
RDW: 19.4 % — ABNORMAL HIGH (ref 11.5–15.5)
WBC: 9.1 10*3/uL (ref 4.0–10.5)
nRBC: 5.8 % — ABNORMAL HIGH (ref 0.0–0.2)

## 2022-05-08 LAB — CULTURE, BLOOD (ROUTINE X 2)
Culture: NO GROWTH
Culture: NO GROWTH
Special Requests: ADEQUATE
Special Requests: ADEQUATE

## 2022-05-23 NOTE — Discharge Summary (Signed)
Death Summary   DEATH SUMMARY   Patient Details  Name: Lawrence Harper MRN: [id number] DOB: 1932/07/31  Admission/Discharge Information   Admit Date:  May 30, 2022  Date of Death: Date of Death: 2022-06-06  Time of Death: Time of Death: 0154  Length of Stay: 6  Code Status:   Referring Physician: System, Provider Not In    Reason(s) for Hospitalization    Diagnoses  Preliminary cause of death:  Secondary Diagnoses (including complications and co-morbidities):  Principal Problem:   Acute on chronic diastolic CHF/HFpEF Active Problems:   NSTEMI (non-ST elevated myocardial infarction)    Acute kidney injury superimposed on chronic kidney disease (Lawrence Harper) Acute hypoxic respiratory failure Aspiration pneumonia Possible acute abdomen Acute metabolic encephalopathy   Essential hypertension   Atrial fibrillation (Lawrence Harper)   COPD (chronic obstructive pulmonary disease) (Lawrence Harper)   Sepsis due to Rt Leg Cellulitis    Hypoalbuminemia due to protein-calorie malnutrition (Lawrence Harper)   Obesity (BMI 30-39.9)    Lawrence Harper is a 88 y.o. year old male with history of chronic right heart failure, pulmonary hypertension, CAD, atrial fibrillation, CKD stage IV and chronic diastolic heart failure, chronic RV failure presented with fatigue. Reported 2 weeks of dyspnea, lower extremity edema, increased abdominal girth and orthopnea. In ED, noted to be in Afib -CXR noted cardiomegaly and mild bilateral hilar vascular congestion, placed on IV Lasix, also started on IV heparin for NSTEMI. Cardiology following, Limited response to diuretics, ischemic work-up not pursued on account of CKD 4 -Palliative care consulted -Overnight 9/13 developed more respiratory distress and abdominal distention, placed on BiPAP, increase diuretics given, vomited and likely aspirated, now obtunded, tachypneic, placed on IV meropenem, started antibiotics for aspiration pneumonia -Continue to decline, seen by  palliative care team, transition to comfort care and expired on 07-Jun-2023   Detailed course   Acute on chronic diastolic CHF/HFpEF Pulm HTN, severe RV failure Echo with EF 50 to 97%Q RV systolic function with severe reduction, RV severe cavity enlargement, RVSP 46,6 mmHg, RA with severe dilatation, severe TR, mild to moderate aortic stenosis,  -diuresed with IV lasix 80 Mg twice daily, limited response to diuretics thus far GDMT limited by CKD4 -Then worsened with aspiration pneumonia, respiratory distress, encephalopathy, prognosis is poor, discussed this with family 9/13, he met with palliative care on 9/13 afternoon and was transitioned to comfort care, expired 06-07-23   Acute hypoxic respiratory failure Aspiration pneumonia -Started on IV meropenem , DuoNebs, n.p.o. -As above   Acute metabolic encephalopathy -Secondary to above, off BiPAP with vomiting   Abdominal distention, tense -KUB with increased gastric air -Unstable for CT abdomen at this time   NSTEMI/CAD Coronary artery disease. -trop peaked at 1314 -Cards following, medical management recommended with CKD4, advanced age -Continue metoprolol, aspirin and atorvastatin.    Acute kidney injury superimposed on chronic kidney disease (Lawrence Harper) CKD stage IV. Hyponatremia. -baseline creat 2.3-2.7 -With worsening   Chronic anemia, due to chronic renal disease, Iron deficiency -Hemoglobin stable   Essential hypertension Continue with metoprolol, and diuresis with furosemide.    Atrial fibrillation (Lawrence Harper) -previously off B blocker w/long pauses, back on low dose now -On Eliquis   COPD (chronic obstructive pulmonary disease) (Lawrence Harper) Suspected COPD    Rt Leg Cellulitis  Sepsis ruled out.  -component of vascular insufficiency combined with skin/STI , Rx w/  oral cephalexin for 8 days.     Hypoalbuminemia due to protein-calorie malnutrition (Lawrence Harper) -Supplements as tolerated   Obesity (BMI 30-39.9) Calculated  BMI is 37,9     Domenic Polite 05/09/2022, 10:42 AM

## 2022-05-23 DEATH — deceased

## 2022-10-30 IMAGING — DX DG KNEE COMPLETE 4+V*L*
4 series · 4 of 4 positions shown · non-contrast
Comparison: March 09, 2006.

CLINICAL DATA: Left knee pain after fall today.

EXAM:
LEFT KNEE - COMPLETE 4+ VIEW

[knee ap]
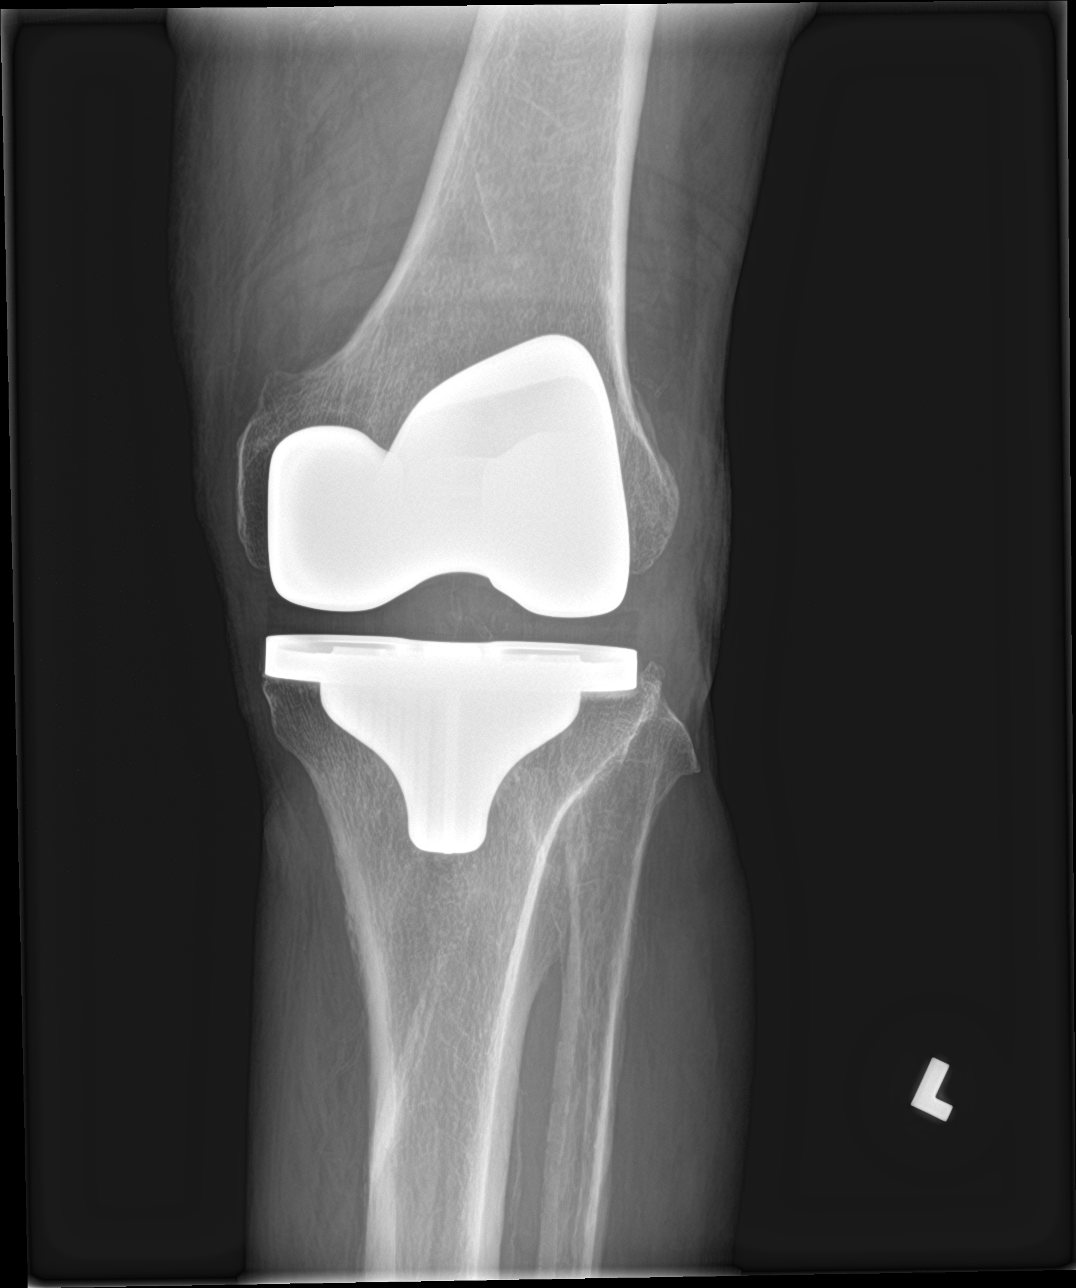

[knee obl (1 of 2)]
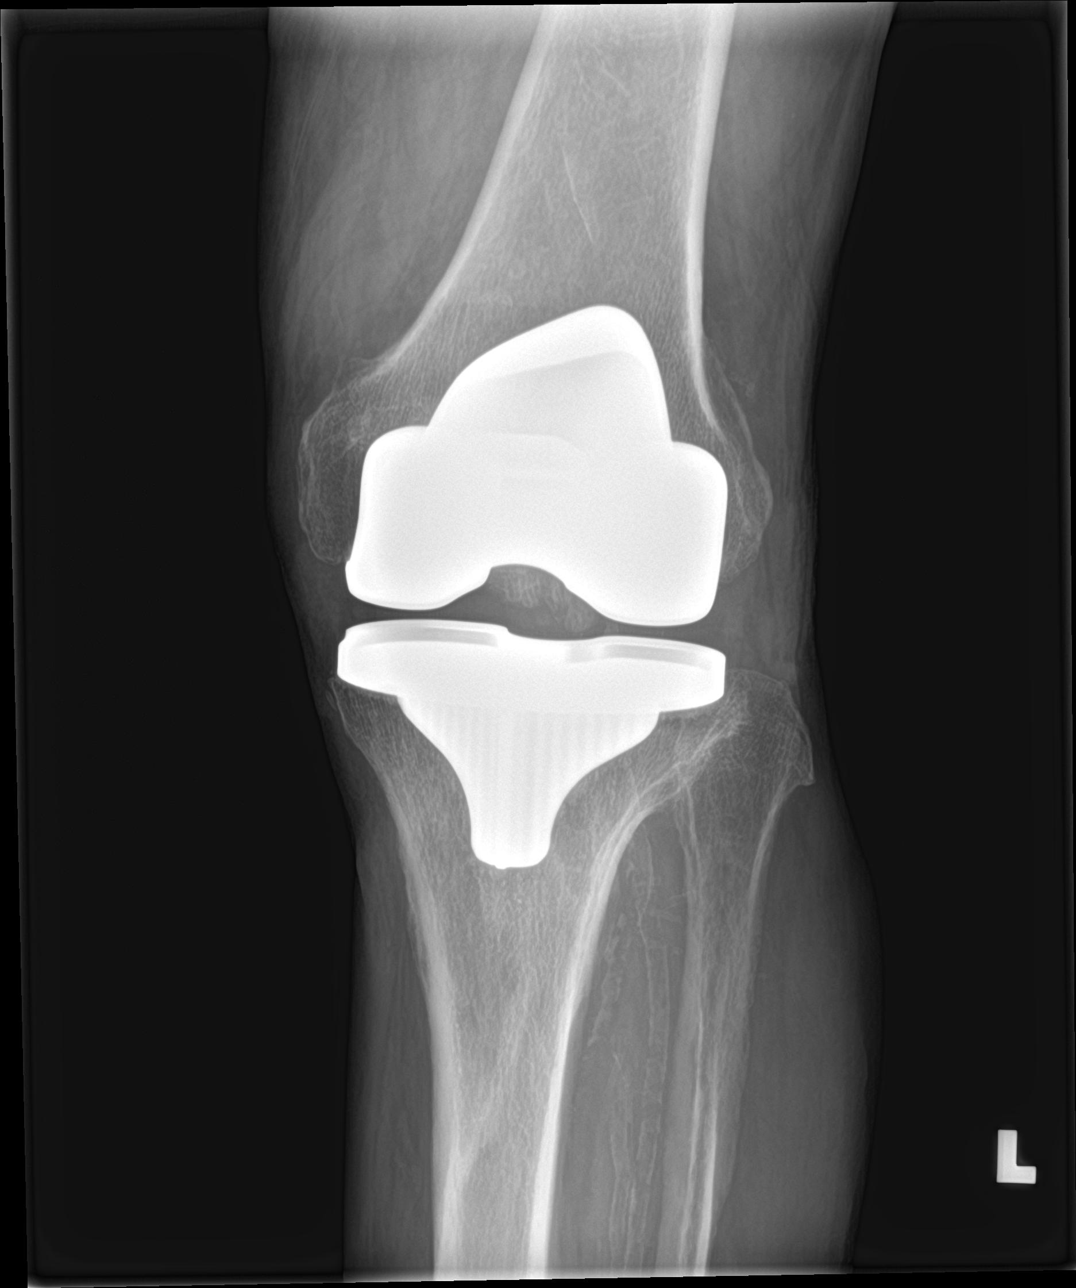

[knee obl (2 of 2)]
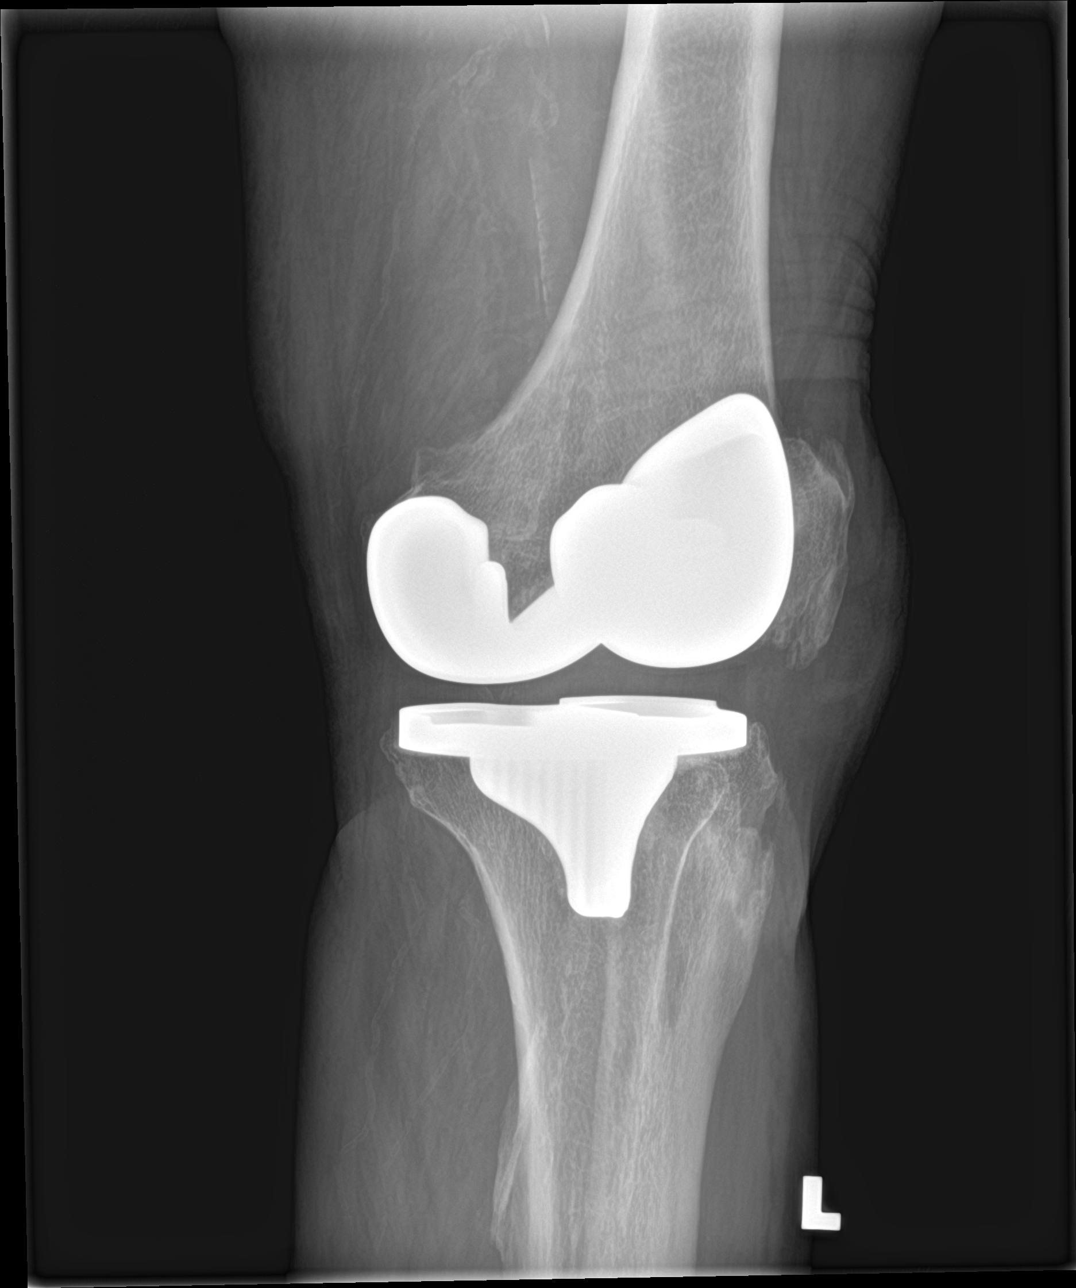

[knee lat]
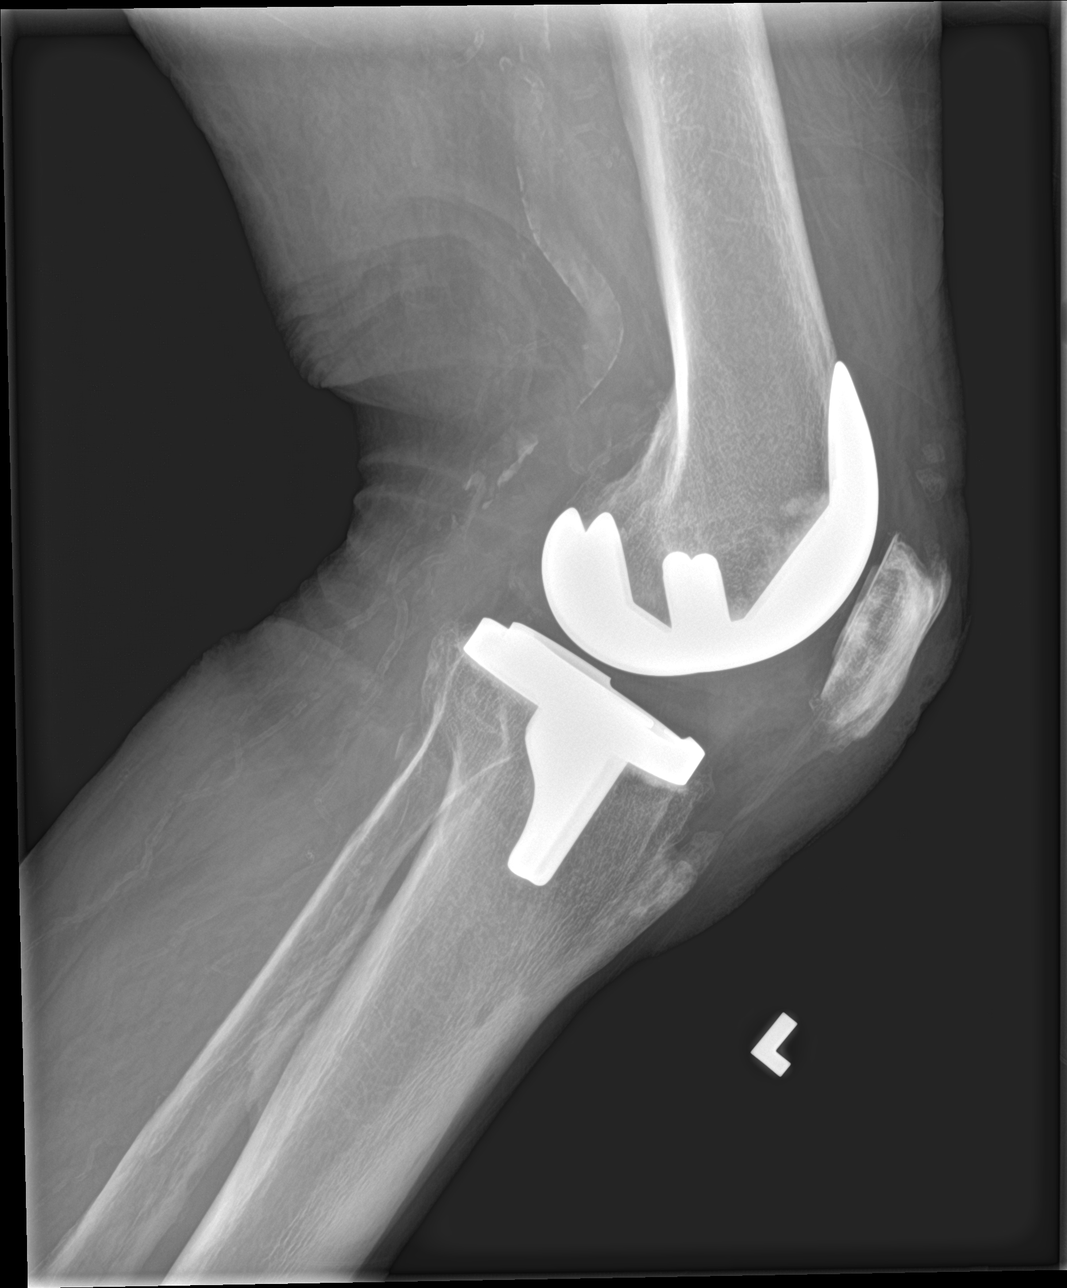

[4 of 4 positions shown; findings below may reference images not displayed]

FINDINGS: Status post left total knee arthroplasty. Vascular calcifications
are noted. No fracture or dislocation is noted.
IMPRESSION: No acute abnormality seen.
